# Patient Record
Sex: Female | Born: 1935 | Race: White | Hispanic: No | Marital: Single | Smoking: Never smoker
Health system: Southern US, Community
[De-identification: ages and names within clinical notes are randomized; demographics above are authoritative.]

## PROBLEM LIST (undated history)

## (undated) DIAGNOSIS — N289 Disorder of kidney and ureter, unspecified: Secondary | ICD-10-CM

## (undated) DIAGNOSIS — E785 Hyperlipidemia, unspecified: Secondary | ICD-10-CM

## (undated) DIAGNOSIS — N189 Chronic kidney disease, unspecified: Secondary | ICD-10-CM

## (undated) DIAGNOSIS — F0390 Unspecified dementia without behavioral disturbance: Secondary | ICD-10-CM

## (undated) DIAGNOSIS — M8580 Other specified disorders of bone density and structure, unspecified site: Secondary | ICD-10-CM

## (undated) HISTORY — DX: Hyperlipidemia, unspecified: E78.5

## (undated) HISTORY — DX: Other specified disorders of bone density and structure, unspecified site: M85.80

---

## 1997-10-04 ENCOUNTER — Other Ambulatory Visit: Admission: RE | Admit: 1997-10-04 | Discharge: 1997-10-04 | Payer: Self-pay | Admitting: *Deleted

## 1999-03-30 ENCOUNTER — Other Ambulatory Visit: Admission: RE | Admit: 1999-03-30 | Discharge: 1999-03-30 | Payer: Self-pay | Admitting: *Deleted

## 2005-03-25 ENCOUNTER — Other Ambulatory Visit: Admission: RE | Admit: 2005-03-25 | Discharge: 2005-03-25 | Payer: Self-pay | Admitting: Family Medicine

## 2010-07-17 ENCOUNTER — Emergency Department (HOSPITAL_COMMUNITY)
Admission: EM | Admit: 2010-07-17 | Discharge: 2010-07-17 | Payer: Self-pay | Source: Home / Self Care | Admitting: Emergency Medicine

## 2010-07-20 LAB — URINALYSIS, ROUTINE W REFLEX MICROSCOPIC
Nitrite: NEGATIVE
Specific Gravity, Urine: 1.009 (ref 1.005–1.030)
Urobilinogen, UA: 0.2 mg/dL (ref 0.0–1.0)
pH: 6.5 (ref 5.0–8.0)

## 2010-07-20 LAB — URINE MICROSCOPIC-ADD ON

## 2010-07-21 LAB — URINE CULTURE

## 2011-04-06 ENCOUNTER — Other Ambulatory Visit: Payer: Self-pay | Admitting: Family Medicine

## 2011-04-06 DIAGNOSIS — Z1231 Encounter for screening mammogram for malignant neoplasm of breast: Secondary | ICD-10-CM

## 2013-11-09 ENCOUNTER — Encounter: Payer: Self-pay | Admitting: Gastroenterology

## 2014-01-07 ENCOUNTER — Ambulatory Visit: Payer: Self-pay | Admitting: Gastroenterology

## 2017-11-08 ENCOUNTER — Other Ambulatory Visit: Payer: Self-pay | Admitting: Internal Medicine

## 2017-11-08 DIAGNOSIS — R413 Other amnesia: Secondary | ICD-10-CM

## 2017-11-15 ENCOUNTER — Other Ambulatory Visit: Payer: Self-pay

## 2018-01-16 ENCOUNTER — Telehealth: Payer: Self-pay

## 2018-01-16 ENCOUNTER — Ambulatory Visit: Payer: Federal, State, Local not specified - PPO | Admitting: Neurology

## 2018-01-16 NOTE — Telephone Encounter (Signed)
Pt did not show for their appt with Dr. Athar today.  

## 2018-01-18 ENCOUNTER — Encounter: Payer: Self-pay | Admitting: Neurology

## 2020-06-25 ENCOUNTER — Encounter (HOSPITAL_COMMUNITY): Payer: Self-pay | Admitting: Emergency Medicine

## 2020-06-25 ENCOUNTER — Emergency Department (HOSPITAL_COMMUNITY): Payer: Medicare Other

## 2020-06-25 ENCOUNTER — Other Ambulatory Visit: Payer: Self-pay

## 2020-06-25 ENCOUNTER — Inpatient Hospital Stay (HOSPITAL_COMMUNITY)
Admission: EM | Admit: 2020-06-25 | Discharge: 2020-07-07 | DRG: 521 | Disposition: A | Discharge status: Skilled Nursing Facility | Payer: Medicare Other | Source: Skilled Nursing Facility | Attending: Internal Medicine | Admitting: Internal Medicine

## 2020-06-25 DIAGNOSIS — R739 Hyperglycemia, unspecified: Secondary | ICD-10-CM | POA: Diagnosis present

## 2020-06-25 DIAGNOSIS — F0281 Dementia in other diseases classified elsewhere with behavioral disturbance: Secondary | ICD-10-CM | POA: Diagnosis not present

## 2020-06-25 DIAGNOSIS — S72009A Fracture of unspecified part of neck of unspecified femur, initial encounter for closed fracture: Secondary | ICD-10-CM | POA: Diagnosis present

## 2020-06-25 DIAGNOSIS — W1839XA Other fall on same level, initial encounter: Secondary | ICD-10-CM | POA: Diagnosis present

## 2020-06-25 DIAGNOSIS — E871 Hypo-osmolality and hyponatremia: Secondary | ICD-10-CM | POA: Diagnosis present

## 2020-06-25 DIAGNOSIS — R5381 Other malaise: Secondary | ICD-10-CM | POA: Diagnosis present

## 2020-06-25 DIAGNOSIS — F05 Delirium due to known physiological condition: Secondary | ICD-10-CM | POA: Diagnosis not present

## 2020-06-25 DIAGNOSIS — S72091A Other fracture of head and neck of right femur, initial encounter for closed fracture: Principal | ICD-10-CM | POA: Diagnosis present

## 2020-06-25 DIAGNOSIS — E43 Unspecified severe protein-calorie malnutrition: Secondary | ICD-10-CM | POA: Diagnosis present

## 2020-06-25 DIAGNOSIS — F0391 Unspecified dementia with behavioral disturbance: Secondary | ICD-10-CM | POA: Diagnosis present

## 2020-06-25 DIAGNOSIS — Z781 Physical restraint status: Secondary | ICD-10-CM

## 2020-06-25 DIAGNOSIS — I440 Atrioventricular block, first degree: Secondary | ICD-10-CM | POA: Diagnosis present

## 2020-06-25 DIAGNOSIS — S72001A Fracture of unspecified part of neck of right femur, initial encounter for closed fracture: Secondary | ICD-10-CM | POA: Diagnosis not present

## 2020-06-25 DIAGNOSIS — R030 Elevated blood-pressure reading, without diagnosis of hypertension: Secondary | ICD-10-CM | POA: Diagnosis present

## 2020-06-25 DIAGNOSIS — Y92122 Bedroom in nursing home as the place of occurrence of the external cause: Secondary | ICD-10-CM

## 2020-06-25 DIAGNOSIS — Z20822 Contact with and (suspected) exposure to covid-19: Secondary | ICD-10-CM | POA: Diagnosis present

## 2020-06-25 DIAGNOSIS — N390 Urinary tract infection, site not specified: Secondary | ICD-10-CM | POA: Diagnosis present

## 2020-06-25 DIAGNOSIS — N9489 Other specified conditions associated with female genital organs and menstrual cycle: Secondary | ICD-10-CM | POA: Diagnosis not present

## 2020-06-25 DIAGNOSIS — Z681 Body mass index (BMI) 19 or less, adult: Secondary | ICD-10-CM | POA: Diagnosis not present

## 2020-06-25 DIAGNOSIS — K5641 Fecal impaction: Secondary | ICD-10-CM | POA: Diagnosis not present

## 2020-06-25 DIAGNOSIS — S0003XA Contusion of scalp, initial encounter: Secondary | ICD-10-CM | POA: Diagnosis present

## 2020-06-25 DIAGNOSIS — M25551 Pain in right hip: Secondary | ICD-10-CM | POA: Diagnosis present

## 2020-06-25 DIAGNOSIS — D72829 Elevated white blood cell count, unspecified: Secondary | ICD-10-CM | POA: Diagnosis not present

## 2020-06-25 DIAGNOSIS — K5900 Constipation, unspecified: Secondary | ICD-10-CM | POA: Diagnosis not present

## 2020-06-25 DIAGNOSIS — F03918 Unspecified dementia, unspecified severity, with other behavioral disturbance: Secondary | ICD-10-CM | POA: Diagnosis present

## 2020-06-25 DIAGNOSIS — E785 Hyperlipidemia, unspecified: Secondary | ICD-10-CM | POA: Diagnosis present

## 2020-06-25 DIAGNOSIS — E876 Hypokalemia: Secondary | ICD-10-CM | POA: Diagnosis not present

## 2020-06-25 DIAGNOSIS — F0390 Unspecified dementia without behavioral disturbance: Secondary | ICD-10-CM | POA: Diagnosis present

## 2020-06-25 DIAGNOSIS — W19XXXA Unspecified fall, initial encounter: Secondary | ICD-10-CM

## 2020-06-25 DIAGNOSIS — D62 Acute posthemorrhagic anemia: Secondary | ICD-10-CM | POA: Diagnosis not present

## 2020-06-25 DIAGNOSIS — R3989 Other symptoms and signs involving the genitourinary system: Secondary | ICD-10-CM | POA: Diagnosis not present

## 2020-06-25 DIAGNOSIS — Z9119 Patient's noncompliance with other medical treatment and regimen: Secondary | ICD-10-CM

## 2020-06-25 DIAGNOSIS — G301 Alzheimer's disease with late onset: Secondary | ICD-10-CM | POA: Diagnosis not present

## 2020-06-25 DIAGNOSIS — D75838 Other thrombocytosis: Secondary | ICD-10-CM | POA: Diagnosis present

## 2020-06-25 DIAGNOSIS — Z96649 Presence of unspecified artificial hip joint: Secondary | ICD-10-CM

## 2020-06-25 DIAGNOSIS — I951 Orthostatic hypotension: Secondary | ICD-10-CM | POA: Diagnosis not present

## 2020-06-25 HISTORY — DX: Disorder of kidney and ureter, unspecified: N28.9

## 2020-06-25 HISTORY — DX: Unspecified dementia, unspecified severity, without behavioral disturbance, psychotic disturbance, mood disturbance, and anxiety: F03.90

## 2020-06-25 HISTORY — DX: Chronic kidney disease, unspecified: N18.9

## 2020-06-25 LAB — CBC WITH DIFFERENTIAL/PLATELET
Abs Immature Granulocytes: 0.08 10*3/uL — ABNORMAL HIGH (ref 0.00–0.07)
Basophils Absolute: 0 10*3/uL (ref 0.0–0.1)
Basophils Relative: 0 %
Eosinophils Absolute: 0.1 10*3/uL (ref 0.0–0.5)
Eosinophils Relative: 1 %
HCT: 36.9 % (ref 36.0–46.0)
Hemoglobin: 12.6 g/dL (ref 12.0–15.0)
Immature Granulocytes: 1 %
Lymphocytes Relative: 7 %
Lymphs Abs: 0.9 10*3/uL (ref 0.7–4.0)
MCH: 30.7 pg (ref 26.0–34.0)
MCHC: 34.1 g/dL (ref 30.0–36.0)
MCV: 90 fL (ref 80.0–100.0)
Monocytes Absolute: 1.1 10*3/uL — ABNORMAL HIGH (ref 0.1–1.0)
Monocytes Relative: 10 %
Neutro Abs: 9.6 10*3/uL — ABNORMAL HIGH (ref 1.7–7.7)
Neutrophils Relative %: 81 %
Platelets: 283 10*3/uL (ref 150–400)
RBC: 4.1 MIL/uL (ref 3.87–5.11)
RDW: 13.5 % (ref 11.5–15.5)
WBC: 11.8 10*3/uL — ABNORMAL HIGH (ref 4.0–10.5)
nRBC: 0 % (ref 0.0–0.2)

## 2020-06-25 LAB — BASIC METABOLIC PANEL
Anion gap: 12 (ref 5–15)
BUN: 24 mg/dL — ABNORMAL HIGH (ref 8–23)
CO2: 23 mmol/L (ref 22–32)
Calcium: 9.4 mg/dL (ref 8.9–10.3)
Chloride: 93 mmol/L — ABNORMAL LOW (ref 98–111)
Creatinine, Ser: 0.83 mg/dL (ref 0.44–1.00)
GFR, Estimated: >60 mL/min (ref >60)
Glucose, Bld: 115 mg/dL — ABNORMAL HIGH (ref 70–99)
Potassium: 4.2 mmol/L (ref 3.5–5.1)
Sodium: 128 mmol/L — ABNORMAL LOW (ref 135–145)

## 2020-06-25 LAB — RESP PANEL BY RT-PCR (FLU A&B, COVID) ARPGX2
Influenza A by PCR: NEGATIVE
Influenza B by PCR: NEGATIVE
SARS Coronavirus 2 by RT PCR: NEGATIVE

## 2020-06-25 LAB — CBG MONITORING, ED: Glucose-Capillary: 157 mg/dL — ABNORMAL HIGH (ref 70–99)

## 2020-06-25 MED ORDER — MORPHINE SULFATE (PF) 2 MG/ML IV SOLN
0.5000 mg | INTRAVENOUS | Status: DC | PRN
  Administered 2020-06-26: 0.5 mg via INTRAVENOUS
  Filled 2020-06-25: qty 1

## 2020-06-25 MED ORDER — FENTANYL CITRATE (PF) 100 MCG/2ML IJ SOLN
50.0000 ug | Freq: Once | INTRAMUSCULAR | Status: AC
  Administered 2020-06-25: 50 ug via INTRAVENOUS
  Filled 2020-06-25: qty 2

## 2020-06-25 NOTE — ED Notes (Signed)
Patient at triage recliner - patients son called for an update, Annette Stable [phone]

## 2020-06-25 NOTE — ED Triage Notes (Signed)
Patient found the floor by staff at [facility] nursing home this evening , unwitnessed fall , reports pain at right hip worse with movement. She is not taking anticoagulant . Assisted on a recliner at triage .

## 2020-06-25 NOTE — H&P (Signed)
History and Physical    Suzanne Hall HMC:[id number] DOB: 1932-08-04 DOA: 06/25/2020  PCP: No primary care provider on file.  Patient coming from: Memory care unit.  Most of the history was obtained from the ER physician.  Patient has dementia.  Chief Complaint: Unwitnessed fall.  HPI: Suzanne Hall is a 86 y.o. female with history of dementia was brought to the ER after patient was found to have unwitnessed fall and was found on the floor.  The exact circumstances of the fall is not clear.  After the fall patient was complaining of right hip pain.  ED Course: In the ER patient's x-ray showed right hip fracture and also CT head shows frontal scalp hematoma.  Labs are significant for sodium of 128 and no old labs to compare.  WBC count 11.8 Covid testing negative EKG shows sinus tachycardia with a first-degree AV block.  Dr. Victorino Dike on-call orthopedic surgeon has been consulted and plan is to have surgery in the morning.  Review of Systems: As per HPI, rest all negative.   Past Medical History:  Diagnosis Date  . CKD (chronic kidney disease)   . Dementia (HCC)   . Hyperlipidemia   . Osteopenia   . Renal disorder     History reviewed. No pertinent surgical history.   reports that she has never smoked. She has never used smokeless tobacco. She reports that she does not drink alcohol and does not use drugs.  No Known Allergies  Family History  Problem Relation Age of Onset  . Heart disease Father     Prior to Admission medications   Not on File    Physical Exam: Constitutional: Moderately built and nourished. Vitals:   06/25/20 1940  BP: (!) 175/82  Pulse: 98  Resp: 16  Temp: 98.3 F (36.8 C)  TempSrc: Oral  SpO2: 91%  Weight: 51.3 kg  Height: 5\' 5"  (1.651 m)   Eyes: Anicteric no pallor. ENMT: No discharge from the ears eyes nose or mouth. Neck: No mass felt.  No neck rigidity. Respiratory: No rhonchi or crepitations. Cardiovascular: S1-S2  heard. Abdomen: Soft nontender bowel sounds present. Musculoskeletal: No edema.  Pain on moving right hip. Skin: No rash. Neurologic: Alert awake oriented to time place and person.  Moves all extremities. Psychiatric: Appears normal.  Normal affect.   Labs on Admission: I have personally reviewed following labs and imaging studies  CBC: Recent Labs  Lab 06/25/20 1948  WBC 11.8*  NEUTROABS 9.6*  HGB 12.6  HCT 36.9  MCV 90.0  PLT 283   Basic Metabolic Panel: Recent Labs  Lab 06/25/20 1948  NA 128*  K 4.2  CL 93*  CO2 23  GLUCOSE 115*  BUN 24*  CREATININE 0.83  CALCIUM 9.4   GFR: Estimated Creatinine Clearance: 40.1 mL/min (by C-G formula based on SCr of 0.83 mg/dL). Liver Function Tests: No results for input(s): AST, ALT, ALKPHOS, BILITOT, PROT, ALBUMIN in the last 168 hours. No results for input(s): LIPASE, AMYLASE in the last 168 hours. No results for input(s): AMMONIA in the last 168 hours. Coagulation Profile: No results for input(s): INR, PROTIME in the last 168 hours. Cardiac Enzymes: No results for input(s): CKTOTAL, CKMB, CKMBINDEX, TROPONINI in the last 168 hours. BNP (last 3 results) No results for input(s): PROBNP in the last 8760 hours. HbA1C: No results for input(s): HGBA1C in the last 72 hours. CBG: No results for input(s): GLUCAP in the last 168 hours. Lipid Profile: No results for input(s):  CHOL, HDL, LDLCALC, TRIG, CHOLHDL, LDLDIRECT in the last 72 hours. Thyroid Function Tests: No results for input(s): TSH, T4TOTAL, FREET4, T3FREE, THYROIDAB in the last 72 hours. Anemia Panel: No results for input(s): VITAMINB12, FOLATE, FERRITIN, TIBC, IRON, RETICCTPCT in the last 72 hours. Urine analysis:    Component Value Date/Time   COLORURINE YELLOW 07/17/2010 2145   APPEARANCEUR CLOUDY (A) 07/17/2010 2145   LABSPEC 1.009 07/17/2010 2145   PHURINE 6.5 07/17/2010 2145   HGBUR LARGE (A) 07/17/2010 2145   BILIRUBINUR NEGATIVE 07/17/2010 2145    KETONESUR NEGATIVE 07/17/2010 2145   PROTEINUR NEGATIVE 07/17/2010 2145   UROBILINOGEN 0.2 07/17/2010 2145   NITRITE NEGATIVE 07/17/2010 2145   LEUKOCYTESUR LARGE (A) 07/17/2010 2145   Sepsis Labs: @LABRCNTIP (procalcitonin:4,lacticidven:4) ) Recent Results (from the past 240 hour(s))  Resp Panel by RT-PCR (Flu A&B, Covid) Nasopharyngeal Swab     Status: None   Collection Time: 06/25/20  8:18 PM   Specimen: Nasopharyngeal Swab; Nasopharyngeal(NP) swabs in vial transport medium  Result Value Ref Range Status   SARS Coronavirus 2 by RT PCR NEGATIVE NEGATIVE Final    Comment: (NOTE) SARS-CoV-2 target nucleic acids are NOT DETECTED.  The SARS-CoV-2 RNA is generally detectable in upper respiratory specimens during the acute phase of infection. The lowest concentration of SARS-CoV-2 viral copies this assay can detect is 138 copies/mL. A negative result does not preclude SARS-Cov-2 infection and should not be used as the sole basis for treatment or other patient management decisions. A negative result may occur with  improper specimen collection/handling, submission of specimen other than nasopharyngeal swab, presence of viral mutation(s) within the areas targeted by this assay, and inadequate number of viral copies(<138 copies/mL). A negative result must be combined with clinical observations, patient history, and epidemiological information. The expected result is Negative.  Fact Sheet for Patients:  BloggerCourse.comhttps://www.fda.gov/media/152166/download  Fact Sheet for Healthcare Providers:  SeriousBroker.ithttps://www.fda.gov/media/152162/download  This test is no t yet approved or cleared by the [place] FDA and  has been authorized for detection and/or diagnosis of SARS-CoV-2 by FDA under an Emergency Use Authorization (EUA). This EUA will remain  in effect (meaning this test can be used) for the duration of the COVID-19 declaration under Section 564(b)(1) of the Act, 21 U.S.C.section 360bbb-3(b)(1),  unless the authorization is terminated  or revoked sooner.       Influenza A by PCR NEGATIVE NEGATIVE Final   Influenza B by PCR NEGATIVE NEGATIVE Final    Comment: (NOTE) The Xpert Xpress SARS-CoV-2/FLU/RSV plus assay is intended as an aid in the diagnosis of influenza from Nasopharyngeal swab specimens and should not be used as a sole basis for treatment. Nasal washings and aspirates are unacceptable for Xpert Xpress SARS-CoV-2/FLU/RSV testing.  Fact Sheet for Patients: BloggerCourse.comhttps://www.fda.gov/media/152166/download  Fact Sheet for Healthcare Providers: SeriousBroker.ithttps://www.fda.gov/media/152162/download  This test is not yet approved or cleared by the [place] FDA and has been authorized for detection and/or diagnosis of SARS-CoV-2 by FDA under an Emergency Use Authorization (EUA). This EUA will remain in effect (meaning this test can be used) for the duration of the COVID-19 declaration under Section 564(b)(1) of the Act, 21 U.S.C. section 360bbb-3(b)(1), unless the authorization is terminated or revoked.  Performed at [facility], [address] [place], [place] [address]      Radiological Exams on Admission: DG Chest 1 View  Result Date: 06/25/2020 CLINICAL DATA:  Right hip fracture. EXAM: CHEST  1 VIEW COMPARISON:  None. FINDINGS: The heart size and mediastinal contours are  within normal limits. Both lungs are clear. The visualized skeletal structures are unremarkable. IMPRESSION: No active disease. Electronically Signed   By: Lupita Raider M.D.   On: 06/25/2020 20:15   CT Head Wo Contrast  Result Date: 06/25/2020 CLINICAL DATA:  Un witnessed fall, head trauma EXAM: CT HEAD WITHOUT CONTRAST TECHNIQUE: Contiguous axial images were obtained from the base of the skull through the vertex without intravenous contrast. COMPARISON:  None. FINDINGS: Brain: Encephalomalacia within the anterior aspect left temporal lobe may reflect previous infarct or trauma. No acute infarct  or hemorrhage. Lateral ventricles and midline structures are unremarkable. No acute extra-axial fluid collections. No mass effect. Vascular: No hyperdense vessel or unexpected calcification. Skull: Minimal right frontal scalp edema. Negative for fracture or focal lesion. Sinuses/Orbits: No acute finding. Other: None. IMPRESSION: 1. Small right frontal scalp hematoma. 2. No acute intracranial process. Electronically Signed   By: Sharlet Salina M.D.   On: 06/25/2020 21:08   CT Cervical Spine Wo Contrast  Result Date: 06/25/2020 CLINICAL DATA:  Un witnessed fall EXAM: CT CERVICAL SPINE WITHOUT CONTRAST TECHNIQUE: Multidetector CT imaging of the cervical spine was performed without intravenous contrast. Multiplanar CT image reconstructions were also generated. COMPARISON:  None. FINDINGS: Alignment: There is mild retrolisthesis of C5 on C6. Otherwise alignment is anatomic. Skull base and vertebrae: No acute fracture. No primary bone lesion or focal pathologic process. Soft tissues and spinal canal: No prevertebral fluid or swelling. No visible canal hematoma. Disc levels: There is mild spondylosis at C3-4, resulting in mild left neural foraminal encroachment. There is moderate spondylosis at C5-6, resulting in symmetrical neural foraminal encroachment. Mild spondylosis at C6-7 results in left-sided neural foraminal encroachment. There is diffuse facet hypertrophy throughout the cervical spine. Upper chest: Airway is patent.  Lung apices are clear. Other: Reconstructed images demonstrate no additional findings. IMPRESSION: 1. Multilevel cervical spondylosis and facet hypertrophy. 2. No acute cervical spine fracture. Electronically Signed   By: Sharlet Salina M.D.   On: 06/25/2020 21:11   DG Hip Unilat W or Wo Pelvis 2-3 Views Right  Result Date: 06/25/2020 CLINICAL DATA:  Right hip pain after fall. EXAM: DG HIP (WITH OR WITHOUT PELVIS) 2-3V RIGHT COMPARISON:  None. FINDINGS: Severely displaced proximal right  femoral neck fracture is noted. Left hip is unremarkable. IMPRESSION: Severely displaced proximal right femoral neck fracture. Electronically Signed   By: Lupita Raider M.D.   On: 06/25/2020 20:15    EKG: Independently reviewed.  Sinus tachycardia with first-degree AV block.  Assessment/Plan Principal Problem:   Closed right hip fracture (HCC) Active Problems:   Hyponatremia   Dementia (HCC)   Hip fracture (HCC)    1. Right hip fracture unwitnessed fall.  Plan is to have surgery in the morning for which patient will be kept n.p.o. past midnight.  Pain relief medications. 2. Hyponatremia cause not clear.  Will trend metabolic panel check urine studies including urine sodium osmolality check TSH and cortisol level. 3. Elevated blood pressure readings we will keep patient on as needed IV hydralazine.  Follow blood pressure trends. 4. History of dementia. 5. Scalp hematoma seen in the CAT scan.  We will need to get further history from patient's son when available. Home medications needs to be verified.    DVT prophylaxis: SCDs.  Avoiding anticoagulation until surgery. Code Status: Full code which is to be confirmed with patient's son. Family Communication: We will need to talk with patient's son. Disposition Plan: May need rehab. Consults called: Orthopedics.  Admission status: Inpatient.   Eduard Clos MD [facility] Pager [phone].  If 7PM-7AM, please contact night-coverage [email or web address] Password [email or web address]  06/25/2020, 11:10 PM

## 2020-06-25 NOTE — ED Provider Notes (Signed)
[facility] Provider Note   CSN: [id number] Arrival date & time: 06/25/20  1925     History Chief Complaint  Patient presents with  . Fall/Hip Pain     Suzanne Hall is a 86 y.o. female.  Patient with history of dementia who presents from nursing home with right hip pain after unwitnessed fall.  Patient states that she remembers walking and having her leg twist.  She did not think that she hit her head.  She is having mostly pain in the right hip.  Denies any headache, neck pain.  She denies being on any blood thinners.  The history is provided by the patient.  Hip Pain This is a new problem. The problem occurs constantly. The problem has not changed since onset.Pertinent negatives include no chest pain, no abdominal pain, no headaches and no shortness of breath. Nothing aggravates the symptoms. Nothing relieves the symptoms. She has tried nothing for the symptoms. The treatment provided no relief.       Past Medical History:  Diagnosis Date  . CKD (chronic kidney disease)   . Dementia (HCC)   . Hyperlipidemia   . Osteopenia   . Renal disorder     Patient Active Problem List   Diagnosis Date Noted  . Closed right hip fracture (HCC) 06/25/2020    History reviewed. No pertinent surgical history.   OB History   No obstetric history on file.     Family History  Problem Relation Age of Onset  . Heart disease Father     Social History   Tobacco Use  . Smoking status: Never Smoker  . Smokeless tobacco: Never Used  Substance Use Topics  . Alcohol use: Never  . Drug use: Never    Home Medications Prior to Admission medications   Not on File    Allergies    Patient has no known allergies.  Review of Systems   Review of Systems  Constitutional: Negative for chills and fever.  HENT: Negative for ear pain and sore throat.   Eyes: Negative for pain and visual disturbance.  Respiratory: Negative for cough and  shortness of breath.   Cardiovascular: Negative for chest pain and palpitations.  Gastrointestinal: Negative for abdominal pain and vomiting.  Genitourinary: Negative for dysuria and hematuria.  Musculoskeletal: Positive for arthralgias and gait problem. Negative for back pain.  Skin: Negative for color change and rash.  Neurological: Negative for seizures, syncope and headaches.  All other systems reviewed and are negative.   Physical Exam Updated Vital Signs  ED Triage Vitals  Enc Vitals Group     BP 06/25/20 1940 (!) 175/82     Pulse Rate 06/25/20 1940 98     Resp 06/25/20 1940 16     Temp 06/25/20 1940 98.3 F (36.8 C)     Temp Source 06/25/20 1940 Oral     SpO2 06/25/20 1940 91 %     Weight 06/25/20 1940 113 lb (51.3 kg)     Height 06/25/20 1940 5\' 5"  (1.651 m)     Head Circumference --      Peak Flow --      Pain Score 06/25/20 1936 7     Pain Loc --      Pain Edu? --      Excl. in GC? --     Physical Exam Vitals and nursing note reviewed.  Constitutional:      General: She is not in acute distress.  Appearance: She is well-developed and well-nourished. She is not ill-appearing.  HENT:     Head:     Comments: Hematoma over the right side of the head    Nose: Nose normal.     Mouth/Throat:     Mouth: Mucous membranes are moist.  Eyes:     Extraocular Movements: Extraocular movements intact.     Conjunctiva/sclera: Conjunctivae normal.     Pupils: Pupils are equal, round, and reactive to light.  Cardiovascular:     Rate and Rhythm: Normal rate and regular rhythm.     Pulses: Normal pulses.     Heart sounds: Normal heart sounds. No murmur heard.   Pulmonary:     Effort: Pulmonary effort is normal. No respiratory distress.     Breath sounds: Normal breath sounds.  Abdominal:     Palpations: Abdomen is soft.     Tenderness: There is no abdominal tenderness.  Musculoskeletal:        General: Tenderness (right hip) present. No edema.     Cervical back:  Normal range of motion and neck supple. No tenderness.  Skin:    General: Skin is warm and dry.     Capillary Refill: Capillary refill takes less than 2 seconds.  Neurological:     General: No focal deficit present.     Mental Status: She is alert.  Psychiatric:        Mood and Affect: Mood and affect normal.     ED Results / Procedures / Treatments   Labs (all labs ordered are listed, but only abnormal results are displayed) Labs Reviewed  CBC WITH DIFFERENTIAL/PLATELET - Abnormal; Notable for the following components:      Result Value   WBC 11.8 (*)    Neutro Abs 9.6 (*)    Monocytes Absolute 1.1 (*)    Abs Immature Granulocytes 0.08 (*)    All other components within normal limits  BASIC METABOLIC PANEL - Abnormal; Notable for the following components:   Sodium 128 (*)    Chloride 93 (*)    Glucose, Bld 115 (*)    BUN 24 (*)    All other components within normal limits  RESP PANEL BY RT-PCR (FLU A&B, COVID) [id number]  URINALYSIS, ROUTINE W REFLEX MICROSCOPIC    EKG EKG Interpretation  Date/Time:  Wednesday June 25 2020 21:27:53 EST Ventricular Rate:  110 PR Interval:    QRS Duration: 64 QT Interval:  310 QTC Calculation: 419 R Axis:   75 Text Interpretation: Sinus tachycardia with 1st degree A-V block Septal infarct , age undetermined Abnormal ECG Confirmed by Virgina Norfolk [phone]) on 06/25/2020 9:35:18 PM   Radiology DG Chest 1 View  Result Date: 06/25/2020 CLINICAL DATA:  Right hip fracture. EXAM: CHEST  1 VIEW COMPARISON:  None. FINDINGS: The heart size and mediastinal contours are within normal limits. Both lungs are clear. The visualized skeletal structures are unremarkable. IMPRESSION: No active disease. Electronically Signed   By: Lupita Raider M.D.   On: 06/25/2020 20:15   CT Head Wo Contrast  Result Date: 06/25/2020 CLINICAL DATA:  Un witnessed fall, head trauma EXAM: CT HEAD WITHOUT CONTRAST TECHNIQUE: Contiguous axial images were obtained from  the base of the skull through the vertex without intravenous contrast. COMPARISON:  None. FINDINGS: Brain: Encephalomalacia within the anterior aspect left temporal lobe may reflect previous infarct or trauma. No acute infarct or hemorrhage. Lateral ventricles and midline structures are unremarkable. No acute extra-axial fluid collections. No mass effect. Vascular:  No hyperdense vessel or unexpected calcification. Skull: Minimal right frontal scalp edema. Negative for fracture or focal lesion. Sinuses/Orbits: No acute finding. Other: None. IMPRESSION: 1. Small right frontal scalp hematoma. 2. No acute intracranial process. Electronically Signed   By: Sharlet Salina M.D.   On: 06/25/2020 21:08   CT Cervical Spine Wo Contrast  Result Date: 06/25/2020 CLINICAL DATA:  Un witnessed fall EXAM: CT CERVICAL SPINE WITHOUT CONTRAST TECHNIQUE: Multidetector CT imaging of the cervical spine was performed without intravenous contrast. Multiplanar CT image reconstructions were also generated. COMPARISON:  None. FINDINGS: Alignment: There is mild retrolisthesis of C5 on C6. Otherwise alignment is anatomic. Skull base and vertebrae: No acute fracture. No primary bone lesion or focal pathologic process. Soft tissues and spinal canal: No prevertebral fluid or swelling. No visible canal hematoma. Disc levels: There is mild spondylosis at C3-4, resulting in mild left neural foraminal encroachment. There is moderate spondylosis at C5-6, resulting in symmetrical neural foraminal encroachment. Mild spondylosis at C6-7 results in left-sided neural foraminal encroachment. There is diffuse facet hypertrophy throughout the cervical spine. Upper chest: Airway is patent.  Lung apices are clear. Other: Reconstructed images demonstrate no additional findings. IMPRESSION: 1. Multilevel cervical spondylosis and facet hypertrophy. 2. No acute cervical spine fracture. Electronically Signed   By: Sharlet Salina M.D.   On: 06/25/2020 21:11   DG  Hip Unilat W or Wo Pelvis 2-3 Views Right  Result Date: 06/25/2020 CLINICAL DATA:  Right hip pain after fall. EXAM: DG HIP (WITH OR WITHOUT PELVIS) 2-3V RIGHT COMPARISON:  None. FINDINGS: Severely displaced proximal right femoral neck fracture is noted. Left hip is unremarkable. IMPRESSION: Severely displaced proximal right femoral neck fracture. Electronically Signed   By: Lupita Raider M.D.   On: 06/25/2020 20:15    Procedures Procedures (including critical care time)  Medications Ordered in ED Medications  fentaNYL (SUBLIMAZE) injection 50 mcg (has no administration in time range)    ED Course  I have reviewed the triage vital signs and the nursing notes.  Pertinent labs & imaging results that were available during my care of the patient were reviewed by me and considered in my medical decision making (see chart for details).    MDM Rules/Calculators/A&P                          MURPHY BUNDICK is an 86 year old female with history of dementia, CKD who presents to the ED with unwitnessed fall.  Pain in the right hip.  Did not think she hit her head.  Maybe there is a small bump over the right side of her head.  Has good pulses in her right lower extremity.  Neurovascularly intact.  X-ray of the right hip shows proximal femoral neck fracture.  CT of the head and neck were overall unremarkable.  General screening labs also unremarkable except for mild hyponatremia with a sodium of 128.  Patient is not on blood thinners or any other chronic medications.  Talk with the patient's son to make them aware.  Talked with Dr. Victorino Dike with orthopedics who recommends n.p.o. after midnight and likely surgery tomorrow.  Patient admitted to medicine for further care.  This chart was dictated using voice recognition software.  Despite best efforts to proofread,  errors can occur which can change the documentation meaning.    Final Clinical Impression(s) / ED Diagnoses Final diagnoses:  Closed  fracture of right hip, initial encounter [facility])    Rx /  DC Orders ED Discharge Orders    None       Virgina Norfolk, DO 06/25/20 2211

## 2020-06-26 ENCOUNTER — Other Ambulatory Visit: Payer: Self-pay

## 2020-06-26 ENCOUNTER — Inpatient Hospital Stay (HOSPITAL_COMMUNITY): Payer: Medicare Other | Admitting: Certified Registered Nurse Anesthetist

## 2020-06-26 ENCOUNTER — Encounter (HOSPITAL_COMMUNITY)
Admission: EM | Disposition: A | Discharge status: Skilled Nursing Facility | Payer: Self-pay | Source: Skilled Nursing Facility | Attending: Internal Medicine

## 2020-06-26 ENCOUNTER — Encounter (HOSPITAL_COMMUNITY): Payer: Self-pay | Admitting: Internal Medicine

## 2020-06-26 ENCOUNTER — Inpatient Hospital Stay (HOSPITAL_COMMUNITY): Payer: Medicare Other

## 2020-06-26 DIAGNOSIS — E871 Hypo-osmolality and hyponatremia: Secondary | ICD-10-CM | POA: Diagnosis not present

## 2020-06-26 DIAGNOSIS — R3989 Other symptoms and signs involving the genitourinary system: Secondary | ICD-10-CM

## 2020-06-26 DIAGNOSIS — S72001A Fracture of unspecified part of neck of right femur, initial encounter for closed fracture: Secondary | ICD-10-CM | POA: Diagnosis not present

## 2020-06-26 DIAGNOSIS — D72829 Elevated white blood cell count, unspecified: Secondary | ICD-10-CM

## 2020-06-26 HISTORY — PX: HIP ARTHROPLASTY: SHX981

## 2020-06-26 LAB — URINALYSIS, ROUTINE W REFLEX MICROSCOPIC
Bilirubin Urine: NEGATIVE
Glucose, UA: NEGATIVE mg/dL
Ketones, ur: NEGATIVE mg/dL
Nitrite: POSITIVE — AB
Protein, ur: 100 mg/dL — AB
Specific Gravity, Urine: 1.016 (ref 1.005–1.030)
WBC, UA: >50 WBC/hpf — ABNORMAL HIGH (ref 0–5)
pH: 6 (ref 5.0–8.0)

## 2020-06-26 LAB — CBC
HCT: 39.6 % (ref 36.0–46.0)
Hemoglobin: 13 g/dL (ref 12.0–15.0)
MCH: 29.4 pg (ref 26.0–34.0)
MCHC: 32.8 g/dL (ref 30.0–36.0)
MCV: 89.6 fL (ref 80.0–100.0)
Platelets: 267 10*3/uL (ref 150–400)
RBC: 4.42 MIL/uL (ref 3.87–5.11)
RDW: 13.2 % (ref 11.5–15.5)
WBC: 14.9 10*3/uL — ABNORMAL HIGH (ref 4.0–10.5)
nRBC: 0 % (ref 0.0–0.2)

## 2020-06-26 LAB — BASIC METABOLIC PANEL
Anion gap: 11 (ref 5–15)
Anion gap: 9 (ref 5–15)
BUN: 20 mg/dL (ref 8–23)
BUN: 18 mg/dL (ref 8–23)
CO2: 22 mmol/L (ref 22–32)
CO2: 24 mmol/L (ref 22–32)
Calcium: 9.1 mg/dL (ref 8.9–10.3)
Calcium: 9.3 mg/dL (ref 8.9–10.3)
Chloride: 93 mmol/L — ABNORMAL LOW (ref 98–111)
Chloride: 93 mmol/L — ABNORMAL LOW (ref 98–111)
Creatinine, Ser: 0.77 mg/dL (ref 0.44–1.00)
Creatinine, Ser: 0.77 mg/dL (ref 0.44–1.00)
GFR, Estimated: >60 mL/min (ref >60)
GFR, Estimated: >60 mL/min (ref >60)
Glucose, Bld: 124 mg/dL — ABNORMAL HIGH (ref 70–99)
Glucose, Bld: 132 mg/dL — ABNORMAL HIGH (ref 70–99)
Potassium: 3.8 mmol/L (ref 3.5–5.1)
Potassium: 3.8 mmol/L (ref 3.5–5.1)
Sodium: 126 mmol/L — ABNORMAL LOW (ref 135–145)
Sodium: 126 mmol/L — ABNORMAL LOW (ref 135–145)

## 2020-06-26 LAB — GLUCOSE, CAPILLARY
Glucose-Capillary: 141 mg/dL — ABNORMAL HIGH (ref 70–99)
Glucose-Capillary: 132 mg/dL — ABNORMAL HIGH (ref 70–99)
Glucose-Capillary: 138 mg/dL — ABNORMAL HIGH (ref 70–99)

## 2020-06-26 LAB — SURGICAL PCR SCREEN
MRSA, PCR: NEGATIVE
Staphylococcus aureus: NEGATIVE

## 2020-06-26 LAB — OSMOLALITY, URINE: Osmolality, Ur: 532 mOsm/kg (ref 300–900)

## 2020-06-26 LAB — CORTISOL: Cortisol, Plasma: 29.2 ug/dL

## 2020-06-26 LAB — TSH: TSH: 3.386 u[IU]/mL (ref 0.350–4.500)

## 2020-06-26 LAB — SODIUM, URINE, RANDOM: Sodium, Ur: 81 mmol/L

## 2020-06-26 SURGERY — HEMIARTHROPLASTY, HIP, DIRECT ANTERIOR APPROACH, FOR FRACTURE
Anesthesia: General | Site: Hip | Laterality: Right

## 2020-06-26 MED ORDER — ACETAMINOPHEN 325 MG PO TABS
325.0000 mg | ORAL_TABLET | Freq: Four times a day (QID) | ORAL | Status: DC | PRN
  Administered 2020-06-30: 650 mg via ORAL
  Administered 2020-07-04: 325 mg via ORAL
  Filled 2020-06-26: qty 1
  Filled 2020-06-26: qty 2

## 2020-06-26 MED ORDER — DOCUSATE SODIUM 100 MG PO CAPS
100.0000 mg | ORAL_CAPSULE | Freq: Two times a day (BID) | ORAL | Status: DC
  Administered 2020-06-26 – 2020-06-30 (×3): 100 mg via ORAL
  Filled 2020-06-26 (×8): qty 1

## 2020-06-26 MED ORDER — METHOCARBAMOL 500 MG PO TABS
500.0000 mg | ORAL_TABLET | Freq: Four times a day (QID) | ORAL | Status: DC | PRN
  Filled 2020-06-26: qty 1

## 2020-06-26 MED ORDER — PHENYLEPHRINE 40 MCG/ML (10ML) SYRINGE FOR IV PUSH (FOR BLOOD PRESSURE SUPPORT)
PREFILLED_SYRINGE | INTRAVENOUS | Status: AC
  Filled 2020-06-26: qty 10

## 2020-06-26 MED ORDER — MAGNESIUM CITRATE PO SOLN: 1.0000 | Freq: Once | ORAL | Status: DC | PRN

## 2020-06-26 MED ORDER — CHLORHEXIDINE GLUCONATE 0.12 % MT SOLN
15.0000 mL | OROMUCOSAL | Status: AC
  Filled 2020-06-26: qty 15

## 2020-06-26 MED ORDER — FENTANYL CITRATE (PF) 250 MCG/5ML IJ SOLN
INTRAMUSCULAR | Status: DC | PRN
  Administered 2020-06-26: 25 ug via INTRAVENOUS
  Administered 2020-06-26: 50 ug via INTRAVENOUS

## 2020-06-26 MED ORDER — METHOCARBAMOL 1000 MG/10ML IJ SOLN
500.0000 mg | Freq: Four times a day (QID) | INTRAVENOUS | Status: DC | PRN
  Filled 2020-06-26: qty 5

## 2020-06-26 MED ORDER — MORPHINE SULFATE (PF) 2 MG/ML IV SOLN
0.5000 mg | INTRAVENOUS | Status: DC | PRN
  Administered 2020-06-26 – 2020-06-28 (×2): 1 mg via INTRAVENOUS
  Administered 2020-06-29: 0.5 mg via INTRAVENOUS
  Administered 2020-06-29 (×2): 1 mg via INTRAVENOUS
  Filled 2020-06-26 (×6): qty 1

## 2020-06-26 MED ORDER — DEXAMETHASONE SODIUM PHOSPHATE 10 MG/ML IJ SOLN
INTRAMUSCULAR | Status: AC
  Filled 2020-06-26: qty 1

## 2020-06-26 MED ORDER — SODIUM CHLORIDE 0.9 % IV SOLN
1.0000 g | INTRAVENOUS | Status: DC
  Administered 2020-06-26 – 2020-07-01 (×5): 1 g via INTRAVENOUS
  Filled 2020-06-26 (×6): qty 10

## 2020-06-26 MED ORDER — PROPOFOL 10 MG/ML IV BOLUS
INTRAVENOUS | Status: DC | PRN
  Administered 2020-06-26: 70 mg via INTRAVENOUS

## 2020-06-26 MED ORDER — EPHEDRINE SULFATE-NACL 50-0.9 MG/10ML-% IV SOSY
PREFILLED_SYRINGE | INTRAVENOUS | Status: DC | PRN
  Administered 2020-06-26: 10 mg via INTRAVENOUS

## 2020-06-26 MED ORDER — PHENYLEPHRINE HCL-NACL 10-0.9 MG/250ML-% IV SOLN
INTRAVENOUS | Status: DC | PRN
  Administered 2020-06-26: 75 ug/min via INTRAVENOUS

## 2020-06-26 MED ORDER — ONDANSETRON HCL 4 MG/2ML IJ SOLN
INTRAMUSCULAR | Status: AC
  Filled 2020-06-26: qty 2

## 2020-06-26 MED ORDER — ENSURE ENLIVE PO LIQD
237.0000 mL | Freq: Three times a day (TID) | ORAL | Status: DC
  Administered 2020-06-26 – 2020-07-07 (×22): 237 mL via ORAL

## 2020-06-26 MED ORDER — DEXAMETHASONE SODIUM PHOSPHATE 10 MG/ML IJ SOLN
INTRAMUSCULAR | Status: DC | PRN
  Administered 2020-06-26: 4 mg via INTRAVENOUS

## 2020-06-26 MED ORDER — CHLORHEXIDINE GLUCONATE 0.12 % MT SOLN
OROMUCOSAL | Status: AC
  Administered 2020-06-26: 08:00:00 15 mL via OROMUCOSAL
  Filled 2020-06-26: qty 15

## 2020-06-26 MED ORDER — SODIUM CHLORIDE 0.9 % IR SOLN
Status: DC | PRN
  Administered 2020-06-26: 1000 mL

## 2020-06-26 MED ORDER — PHENYLEPHRINE 40 MCG/ML (10ML) SYRINGE FOR IV PUSH (FOR BLOOD PRESSURE SUPPORT)
PREFILLED_SYRINGE | INTRAVENOUS | Status: DC | PRN
  Administered 2020-06-26 (×2): 160 ug via INTRAVENOUS
  Administered 2020-06-26: 80 ug via INTRAVENOUS

## 2020-06-26 MED ORDER — ROCURONIUM BROMIDE 10 MG/ML (PF) SYRINGE
PREFILLED_SYRINGE | INTRAVENOUS | Status: DC | PRN
  Administered 2020-06-26: 50 mg via INTRAVENOUS

## 2020-06-26 MED ORDER — ASPIRIN 81 MG PO CHEW
81.0000 mg | CHEWABLE_TABLET | Freq: Two times a day (BID) | ORAL | Status: DC
  Administered 2020-06-27 – 2020-06-29 (×4): 81 mg via ORAL
  Filled 2020-06-26 (×7): qty 1

## 2020-06-26 MED ORDER — DIPHENHYDRAMINE HCL 12.5 MG/5ML PO ELIX
12.5000 mg | ORAL_SOLUTION | ORAL | Status: DC | PRN
  Filled 2020-06-26: qty 10

## 2020-06-26 MED ORDER — OXYCODONE HCL 5 MG PO TABS: 5.0000 mg | ORAL_TABLET | Freq: Once | ORAL | Status: DC | PRN

## 2020-06-26 MED ORDER — FERROUS SULFATE 325 (65 FE) MG PO TABS
325.0000 mg | ORAL_TABLET | Freq: Three times a day (TID) | ORAL | Status: DC
  Administered 2020-06-26 – 2020-07-07 (×31): 325 mg via ORAL
  Filled 2020-06-26 (×28): qty 1

## 2020-06-26 MED ORDER — DEXAMETHASONE SODIUM PHOSPHATE 10 MG/ML IJ SOLN
10.0000 mg | Freq: Once | INTRAMUSCULAR | Status: AC
  Administered 2020-06-27: 10 mg via INTRAVENOUS
  Filled 2020-06-26: qty 1

## 2020-06-26 MED ORDER — FENTANYL CITRATE (PF) 100 MCG/2ML IJ SOLN
INTRAMUSCULAR | Status: AC
  Filled 2020-06-26: qty 2

## 2020-06-26 MED ORDER — ROCURONIUM BROMIDE 10 MG/ML (PF) SYRINGE
PREFILLED_SYRINGE | INTRAVENOUS | Status: AC
  Filled 2020-06-26: qty 10

## 2020-06-26 MED ORDER — TRANEXAMIC ACID-NACL 1000-0.7 MG/100ML-% IV SOLN
1000.0000 mg | Freq: Once | INTRAVENOUS | Status: AC
  Administered 2020-06-26: 13:00:00 1000 mg via INTRAVENOUS
  Filled 2020-06-26: qty 100

## 2020-06-26 MED ORDER — TRAMADOL HCL 50 MG PO TABS
50.0000 mg | ORAL_TABLET | Freq: Four times a day (QID) | ORAL | Status: DC | PRN
  Administered 2020-06-30 – 2020-07-01 (×2): 50 mg via ORAL
  Filled 2020-06-26 (×3): qty 1

## 2020-06-26 MED ORDER — ONDANSETRON HCL 4 MG/2ML IJ SOLN
4.0000 mg | Freq: Four times a day (QID) | INTRAMUSCULAR | Status: DC | PRN
  Administered 2020-06-29: 4 mg via INTRAVENOUS
  Filled 2020-06-26: qty 2

## 2020-06-26 MED ORDER — METOCLOPRAMIDE HCL 5 MG PO TABS
5.0000 mg | ORAL_TABLET | Freq: Three times a day (TID) | ORAL | Status: DC | PRN
  Filled 2020-06-26: qty 2

## 2020-06-26 MED ORDER — FENTANYL CITRATE (PF) 250 MCG/5ML IJ SOLN
INTRAMUSCULAR | Status: AC
  Filled 2020-06-26: qty 5

## 2020-06-26 MED ORDER — LIDOCAINE 2% (20 MG/ML) 5 ML SYRINGE
INTRAMUSCULAR | Status: AC
  Filled 2020-06-26: qty 10

## 2020-06-26 MED ORDER — ADULT MULTIVITAMIN W/MINERALS CH
1.0000 | ORAL_TABLET | Freq: Every day | ORAL | Status: DC
  Administered 2020-06-27 – 2020-07-07 (×11): 1 via ORAL
  Filled 2020-06-26 (×12): qty 1

## 2020-06-26 MED ORDER — TRANEXAMIC ACID-NACL 1000-0.7 MG/100ML-% IV SOLN
1000.0000 mg | INTRAVENOUS | Status: AC
  Administered 2020-06-26: 09:00:00 1000 mg via INTRAVENOUS
  Filled 2020-06-26: qty 100

## 2020-06-26 MED ORDER — EPHEDRINE 5 MG/ML INJ
INTRAVENOUS | Status: AC
  Filled 2020-06-26: qty 10

## 2020-06-26 MED ORDER — LACTATED RINGERS IV SOLN: INTRAVENOUS | Status: DC

## 2020-06-26 MED ORDER — PHENOL 1.4 % MT LIQD: 1.0000 | OROMUCOSAL | Status: DC | PRN

## 2020-06-26 MED ORDER — LIDOCAINE 2% (20 MG/ML) 5 ML SYRINGE
INTRAMUSCULAR | Status: DC | PRN
  Administered 2020-06-26: 40 mg via INTRAVENOUS

## 2020-06-26 MED ORDER — MENTHOL 3 MG MT LOZG: 1.0000 | LOZENGE | OROMUCOSAL | Status: DC | PRN

## 2020-06-26 MED ORDER — BISACODYL 10 MG RE SUPP
10.0000 mg | Freq: Every day | RECTAL | Status: DC | PRN
  Administered 2020-06-30: 10 mg via RECTAL
  Filled 2020-06-26: qty 1

## 2020-06-26 MED ORDER — ONDANSETRON HCL 4 MG PO TABS
4.0000 mg | ORAL_TABLET | Freq: Four times a day (QID) | ORAL | Status: DC | PRN
  Filled 2020-06-26: qty 1

## 2020-06-26 MED ORDER — HYDRALAZINE HCL 20 MG/ML IJ SOLN: 5.0000 mg | INTRAMUSCULAR | Status: DC | PRN

## 2020-06-26 MED ORDER — CHLORHEXIDINE GLUCONATE 4 % EX LIQD: 60.0000 mL | Freq: Once | CUTANEOUS | Status: DC

## 2020-06-26 MED ORDER — ONDANSETRON HCL 4 MG/2ML IJ SOLN
INTRAMUSCULAR | Status: DC | PRN
  Administered 2020-06-26: 4 mg via INTRAVENOUS

## 2020-06-26 MED ORDER — ALBUMIN HUMAN 5 % IV SOLN: INTRAVENOUS | Status: DC | PRN

## 2020-06-26 MED ORDER — ONDANSETRON HCL 4 MG/2ML IJ SOLN: 4.0000 mg | Freq: Four times a day (QID) | INTRAMUSCULAR | Status: DC | PRN

## 2020-06-26 MED ORDER — CEFAZOLIN SODIUM-DEXTROSE 2-4 GM/100ML-% IV SOLN
2.0000 g | Freq: Four times a day (QID) | INTRAVENOUS | Status: DC
  Administered 2020-06-26: 15:00:00 2 g via INTRAVENOUS
  Filled 2020-06-26 (×2): qty 100

## 2020-06-26 MED ORDER — ALUM & MAG HYDROXIDE-SIMETH 200-200-20 MG/5ML PO SUSP: 15.0000 mL | ORAL | Status: DC | PRN

## 2020-06-26 MED ORDER — METOCLOPRAMIDE HCL 5 MG/ML IJ SOLN
5.0000 mg | Freq: Three times a day (TID) | INTRAMUSCULAR | Status: DC | PRN
Start: 2020-06-26 — End: 2020-07-02

## 2020-06-26 MED ORDER — POLYETHYLENE GLYCOL 3350 17 G PO PACK
17.0000 g | PACK | Freq: Two times a day (BID) | ORAL | Status: DC
  Administered 2020-06-26 – 2020-07-07 (×18): 17 g via ORAL
  Filled 2020-06-26 (×20): qty 1

## 2020-06-26 MED ORDER — SUGAMMADEX SODIUM 200 MG/2ML IV SOLN
INTRAVENOUS | Status: DC | PRN
  Administered 2020-06-26: 125 mg via INTRAVENOUS

## 2020-06-26 MED ORDER — OXYCODONE HCL 5 MG/5ML PO SOLN: 5.0000 mg | Freq: Once | ORAL | Status: DC | PRN

## 2020-06-26 MED ORDER — FENTANYL CITRATE (PF) 100 MCG/2ML IJ SOLN
25.0000 ug | INTRAMUSCULAR | Status: DC | PRN
  Administered 2020-06-26: 25 ug via INTRAVENOUS

## 2020-06-26 MED ORDER — SODIUM CHLORIDE 0.9 % IV SOLN: INTRAVENOUS | Status: DC

## 2020-06-26 MED ORDER — POVIDONE-IODINE 10 % EX SWAB: 2.0000 "application " | Freq: Once | CUTANEOUS | Status: DC

## 2020-06-26 MED ORDER — CEFAZOLIN SODIUM-DEXTROSE 2-4 GM/100ML-% IV SOLN
2.0000 g | INTRAVENOUS | Status: AC
  Administered 2020-06-26: 09:00:00 2 g via INTRAVENOUS
  Filled 2020-06-26 (×2): qty 100

## 2020-06-26 SURGICAL SUPPLY — 56 items
BLADE SAW SGTL 18X1.27X75 (BLADE) ×2 IMPLANT
COVER SURGICAL LIGHT HANDLE (MISCELLANEOUS) ×2 IMPLANT
COVER WAND RF STERILE (DRAPES) ×2 IMPLANT
DERMABOND ADVANCED (GAUZE/BANDAGES/DRESSINGS) ×1
DERMABOND ADVANCED .7 DNX12 (GAUZE/BANDAGES/DRESSINGS) ×1 IMPLANT
DRAPE IMP U-DRAPE 54X76 (DRAPES) ×2 IMPLANT
DRAPE INCISE IOBAN 66X45 STRL (DRAPES) ×2 IMPLANT
DRAPE INCISE IOBAN 85X60 (DRAPES) ×2 IMPLANT
DRAPE ORTHO SPLIT 77X108 STRL (DRAPES) ×4
DRAPE SURG ORHT 6 SPLT 77X108 (DRAPES) ×2 IMPLANT
DRAPE U-SHAPE 47X51 STRL (DRAPES) ×2 IMPLANT
DRSG AQUACEL AG ADV 3.5X10 (GAUZE/BANDAGES/DRESSINGS) ×2 IMPLANT
DURAPREP 26ML APPLICATOR (WOUND CARE) ×2 IMPLANT
ELECT BLADE 4.0 EZ CLEAN MEGAD (MISCELLANEOUS) ×2
ELECT REM PT RETURN 9FT ADLT (ELECTROSURGICAL) ×2
ELECTRODE BLDE 4.0 EZ CLN MEGD (MISCELLANEOUS) ×1 IMPLANT
ELECTRODE REM PT RTRN 9FT ADLT (ELECTROSURGICAL) ×1 IMPLANT
EVACUATOR 1/8 PVC DRAIN (DRAIN) IMPLANT
FACESHIELD WRAPAROUND (MASK) ×4 IMPLANT
GLOVE BIOGEL PI IND STRL 7.5 (GLOVE) ×1 IMPLANT
GLOVE BIOGEL PI IND STRL 8.5 (GLOVE) ×2 IMPLANT
GLOVE BIOGEL PI INDICATOR 7.5 (GLOVE) ×1
GLOVE BIOGEL PI INDICATOR 8.5 (GLOVE) ×2
GLOVE ECLIPSE 8.0 STRL XLNG CF (GLOVE) ×2 IMPLANT
GLOVE ORTHO TXT STRL SZ7.5 (GLOVE) ×2 IMPLANT
GOWN STRL REUS W/ TWL LRG LVL3 (GOWN DISPOSABLE) ×3 IMPLANT
GOWN STRL REUS W/TWL 2XL LVL3 (GOWN DISPOSABLE) ×2 IMPLANT
GOWN STRL REUS W/TWL LRG LVL3 (GOWN DISPOSABLE) ×6
HANDPIECE INTERPULSE COAX TIP (DISPOSABLE)
HEAD FEM UNIPOLAR 44 OD STRL (Hips) ×2 IMPLANT
IMMOBILIZER KNEE 22 UNIV (SOFTGOODS) ×2 IMPLANT
KIT BASIN OR (CUSTOM PROCEDURE TRAY) ×2 IMPLANT
KIT TURNOVER KIT B (KITS) ×2 IMPLANT
MANIFOLD NEPTUNE II (INSTRUMENTS) ×2 IMPLANT
NS IRRIG 1000ML POUR BTL (IV SOLUTION) ×2 IMPLANT
PACK TOTAL JOINT (CUSTOM PROCEDURE TRAY) ×2 IMPLANT
PACK UNIVERSAL I (CUSTOM PROCEDURE TRAY) ×2 IMPLANT
PAD ARMBOARD 7.5X6 YLW CONV (MISCELLANEOUS) ×4 IMPLANT
PENCIL BUTTON HOLSTER BLD 10FT (ELECTRODE) ×2 IMPLANT
SET HNDPC FAN SPRY TIP SCT (DISPOSABLE) IMPLANT
SPACER FEM TAPERED +5 12/14 (Hips) ×2 IMPLANT
SPONGE LAP 4X18 RFD (DISPOSABLE) ×4 IMPLANT
STEM FEMORAL SZ 5MM STD ACTIS (Stem) ×2 IMPLANT
SUT MNCRL AB 4-0 PS2 18 (SUTURE) IMPLANT
SUT VIC AB 1 CT1 27 (SUTURE) ×2
SUT VIC AB 1 CT1 27XBRD ANBCTR (SUTURE) ×1 IMPLANT
SUT VIC AB 2-0 CT1 27 (SUTURE) ×4
SUT VIC AB 2-0 CT1 TAPERPNT 27 (SUTURE) ×2 IMPLANT
SUT VLOC 180 0 24IN GS25 (SUTURE) ×2 IMPLANT
SYR BULB IRRIG 60ML STRL (SYRINGE) ×2 IMPLANT
TOWEL GREEN STERILE (TOWEL DISPOSABLE) ×2 IMPLANT
TOWEL GREEN STERILE FF (TOWEL DISPOSABLE) ×2 IMPLANT
TRAY FOLEY W/BAG SLVR 14FR (SET/KITS/TRAYS/PACK) IMPLANT
TUBE CONNECTING 12X1/4 (SUCTIONS) ×2 IMPLANT
WATER STERILE IRR 1000ML POUR (IV SOLUTION) ×2 IMPLANT
YANKAUER SUCT BULB TIP NO VENT (SUCTIONS) ×2 IMPLANT

## 2020-06-26 NOTE — Consult Note (Signed)
Reason for Consult: right hip fracture Referring Physician: Marland Mcalpine, MD  Suzanne Hall is an 86 y.o. female.  HPI:  PCP: No primary care provider on file.  Patient coming from: Memory care unit.  Most of the history was obtained from the ER physician.  Patient has dementia.  Chief Complaint: Unwitnessed fall.  HPI: Suzanne Hall is a 86 y.o. female with history of dementia was brought to the ER after patient was found to have unwitnessed fall and was found on the floor.  The exact circumstances of the fall is not clear.  After the fall patient was complaining of right hip pain.  ED Course: In the ER patient's x-ray showed right hip fracture and also CT head shows frontal scalp hematoma.  Labs are significant for sodium of 128 and no old labs to compare.  WBC count 11.8 Covid testing negative EKG shows sinus tachycardia with a first-degree AV block.    Orthopaeidcs consulted for definitive management of her hip   Past Medical History:  Diagnosis Date  . CKD (chronic kidney disease)   . Dementia (HCC)   . Hyperlipidemia   . Osteopenia   . Renal disorder     History reviewed. No pertinent surgical history.  Family History  Problem Relation Age of Onset  . Heart disease Father     Social History:  reports that she has never smoked. She has never used smokeless tobacco. She reports that she does not drink alcohol and does not use drugs.  Allergies: No Known Allergies  Medications: I have reviewed the patient's current medications. Continuous:   Results for orders placed or performed during the hospital encounter of 06/25/20 (from the past 24 hour(s))  CBC with Differential     Status: Abnormal   Collection Time: 06/25/20  7:48 PM  Result Value Ref Range   WBC 11.8 (H) 4.0 - 10.5 K/uL   RBC 4.10 3.87 - 5.11 MIL/uL   Hemoglobin 12.6 12.0 - 15.0 g/dL   HCT 62.9 52.8 - 41.3 %   MCV 90.0 80.0 - 100.0 fL   MCH 30.7 26.0 - 34.0 pg   MCHC 34.1 30.0 - 36.0 g/dL   RDW  24.4 01.0 - 27.2 %   Platelets 283 150 - 400 K/uL   nRBC 0.0 0.0 - 0.2 %   Neutrophils Relative % 81 %   Neutro Abs 9.6 (H) 1.7 - 7.7 K/uL   Lymphocytes Relative 7 %   Lymphs Abs 0.9 0.7 - 4.0 K/uL   Monocytes Relative 10 %   Monocytes Absolute 1.1 (H) 0.1 - 1.0 K/uL   Eosinophils Relative 1 %   Eosinophils Absolute 0.1 0.0 - 0.5 K/uL   Basophils Relative 0 %   Basophils Absolute 0.0 0.0 - 0.1 K/uL   Immature Granulocytes 1 %   Abs Immature Granulocytes 0.08 (H) 0.00 - 0.07 K/uL  Basic metabolic panel     Status: Abnormal   Collection Time: 06/25/20  7:48 PM  Result Value Ref Range   Sodium 128 (L) 135 - 145 mmol/L   Potassium 4.2 3.5 - 5.1 mmol/L   Chloride 93 (L) 98 - 111 mmol/L   CO2 23 22 - 32 mmol/L   Glucose, Bld 115 (H) 70 - 99 mg/dL   BUN 24 (H) 8 - 23 mg/dL   Creatinine, Ser 5.36 0.44 - 1.00 mg/dL   Calcium 9.4 8.9 - 64.4 mg/dL   GFR, Estimated >03 >47 mL/min   Anion gap 12 5 -  15  Resp Panel by RT-PCR (Flu A&B, Covid) Nasopharyngeal Swab     Status: None   Collection Time: 06/25/20  8:18 PM   Specimen: Nasopharyngeal Swab; Nasopharyngeal(NP) swabs in vial transport medium  Result Value Ref Range   SARS Coronavirus 2 by RT PCR NEGATIVE NEGATIVE   Influenza A by PCR NEGATIVE NEGATIVE   Influenza B by PCR NEGATIVE NEGATIVE  CBG monitoring, ED     Status: Abnormal   Collection Time: 06/25/20 11:28 PM  Result Value Ref Range   Glucose-Capillary 157 (H) 70 - 99 mg/dL  CBC     Status: Abnormal   Collection Time: 06/26/20  4:13 AM  Result Value Ref Range   WBC 14.9 (H) 4.0 - 10.5 K/uL   RBC 4.42 3.87 - 5.11 MIL/uL   Hemoglobin 13.0 12.0 - 15.0 g/dL   HCT 60.7 37.1 - 06.2 %   MCV 89.6 80.0 - 100.0 fL   MCH 29.4 26.0 - 34.0 pg   MCHC 32.8 30.0 - 36.0 g/dL   RDW 69.4 85.4 - 62.7 %   Platelets 267 150 - 400 K/uL   nRBC 0.0 0.0 - 0.2 %  Basic metabolic panel     Status: Abnormal   Collection Time: 06/26/20  4:13 AM  Result Value Ref Range   Sodium 126 (L) 135 - 145  mmol/L   Potassium 3.8 3.5 - 5.1 mmol/L   Chloride 93 (L) 98 - 111 mmol/L   CO2 22 22 - 32 mmol/L   Glucose, Bld 124 (H) 70 - 99 mg/dL   BUN 20 8 - 23 mg/dL   Creatinine, Ser 0.35 0.44 - 1.00 mg/dL   Calcium 9.1 8.9 - 00.9 mg/dL   GFR, Estimated >38 >18 mL/min   Anion gap 11 5 - 15  Basic metabolic panel     Status: Abnormal   Collection Time: 06/26/20  6:47 AM  Result Value Ref Range   Sodium 126 (L) 135 - 145 mmol/L   Potassium 3.8 3.5 - 5.1 mmol/L   Chloride 93 (L) 98 - 111 mmol/L   CO2 24 22 - 32 mmol/L   Glucose, Bld 132 (H) 70 - 99 mg/dL   BUN 18 8 - 23 mg/dL   Creatinine, Ser 2.99 0.44 - 1.00 mg/dL   Calcium 9.3 8.9 - 37.1 mg/dL   GFR, Estimated >69 >67 mL/min   Anion gap 9 5 - 15  Glucose, capillary     Status: Abnormal   Collection Time: 06/26/20  6:50 AM  Result Value Ref Range   Glucose-Capillary 141 (H) 70 - 99 mg/dL    X-ray: CLINICAL DATA:  Right hip fracture.  EXAM: CHEST  1 VIEW  COMPARISON:  None.  FINDINGS: The heart size and mediastinal contours are within normal limits. Both lungs are clear. The visualized skeletal structures are unremarkable.  IMPRESSION: No active disease.   Electronically Signed   By: Lupita Raider M.D.  ROS  Dementia Otherwise per HPI  Blood pressure (!) 154/76, pulse 92, temperature 99.3 F (37.4 C), temperature source Oral, resp. rate 17, height 5\' 5"  (1.651 m), weight 51.3 kg, SpO2 94 %.  Physical Exam  Awake, alert Eyes: Anicteric no pallor. ENMT: No discharge from the ears eyes nose or mouth. Neck: No mass felt.  No neck rigidity. Respiratory: No rhonchi or crepitations. Cardiovascular: S1-S2 heard. Abdomen: Soft nontender bowel sounds present. Musculoskeletal: No edema.  Pain on moving right hip. RLE short and externally rotated Skin: No rash.  Neurologic: Alert awake oriented to time place and person.  Moves all extremities. Psychiatric: Appears normal.  Normal affect.   Assessment/Plan: 1.  Displaced right femoral neck fracture  Plan: Due to the nature of her fracture and baseline activity level I recommend that she have a hemiarthroplasty performed to assist with pain control and activity. I discussed this with her son who is [place] hunting with his son.  We discussed treatment options and recommendations and he agrees that this would be in her best interest NPO Consent will be signed in pre-op  [name] 06/26/2020, 7:23 AM

## 2020-06-26 NOTE — Progress Notes (Signed)
PROGRESS NOTE    Suzanne MostJoanne S Stegeman  ZOX:[id number] DOB: [birth year] DOA: 06/25/2020 PCP: Patient, No Pcp Per   Brief Narrative:  HPI per Dr. Midge MiniumArshad Kakrakandy on 06/25/20 Suzanne Hall is a [age] y.o. female with history of dementia was brought to the ER after patient was found to have unwitnessed fall and was found on the floor.  The exact circumstances of the fall is not clear.  After the fall patient was complaining of right hip pain.  ED Course: In the ER patient's x-ray showed right hip fracture and also CT head shows frontal scalp hematoma.  Labs are significant for sodium of 128 and no old labs to compare.  WBC count 11.8 Covid testing negative EKG shows sinus tachycardia with a first-degree AV block.  Dr. Victorino DikeHewitt on-call orthopedic surgeon has been consulted and plan is to have surgery in the morning.  **Interim History Patient underwent a right hip hemiarthroplasty utilizing the DePuy component By Dr. Charlann Boxerlin.she went back to the floor and she is doing okay but does complain of significant hip pain.  Assessment & Plan:   Principal Problem:   Closed right hip fracture (HCC) Active Problems:   Hyponatremia   Dementia (HCC)   Hip fracture (HCC)  Right hip fracture unwitnessed fall.   -Orthopedic surgery was consulted and she kept n.p.o. after midnight for surgical intervention -She underwent a right hip hemiarthroplasty today done by Dr. Horton FinerIllness- -continue pain control with acetaminophen, fentanyl was given IV once, IV morphine, p.o. tramadol 50 to 100 mg every 6 hours for severe pain, methocarbamol and bowel regimen with bisacodyl 10 mg rectally as needed for moderate constipation and also is on MiraLAX 17 g p.o. twice daily -Head CT done and showed "Small right frontal scalp hematoma. No acute intracranial process." -VTE prophylaxis per orthopedic surgery and they are recommending aspirin 81 mg p.o. twice daily -Antiemetics with ondansetron and metoclopramide if ineffective -we will  need PT OT evaluation for further disposition planning  Hyponatremia  -cause not clear.  Will trend metabolic panel check urine studies including urine sodium osmolality check TSH which was 3.38 and cortisol level was 29.2.  -Sodium level is 126 and will repeat again tomorrow -Currently getting normal saline 100 mils per hour  Leukocytosis -In the setting of her fall and likely urinary tract infection -Patient WBC was 14.9 and will need to continue monitor and trend and repeat CMP in a.m. -Urinalysis done and showed a hazy appearance with moderate hemoglobin, large leukocytes, positive nitrites, many bacteria, 0-5 squamous epithelial cells, greater than 50 WBCs  -Check urine culture unfortunately she received preoperative antibiotics with cefazolin and unsure whether her urine culture will be of any value now -empirically start her on IV ceftriaxone after urine cultures been obtained  Elevated blood pressure readings  -we will keep patient on as needed IV hydralazine.  Follow blood pressure trends. -Last blood pressure reading was 1 -Continue to monitor blood pressures per protocol  History of dementia. -Placed on delirium precautions  Scalp hematoma  -seen in the CAT scan.  Hyperglycemia  -Patient's blood sugar ranging from 115-132 1 daily BMPs  -Check hemoglobin A1c in a.m. We will continue monitor blood sugars carefully and if necessary will place on sensitive NovoLog sign scale insulin AC   DVT prophylaxis: SCDs, aspirin 81 mg p.o. twice daily Code Status: FULL CODE Family Communication: No family present at bedside Disposition Plan: Pending Ortho clearance and evaluation by PT OT  Status is: Inpatient  Remains inpatient  appropriate because:Unsafe d/c plan, IV treatments appropriate due to intensity of illness or inability to take PO and Inpatient level of care appropriate due to severity of illness   Dispo: The patient is from: Home              Anticipated d/c is to:  TBD              Anticipated d/c date is: 2 days              Patient currently is not medically stable to d/c.  Consultants:   Orthopedic Surgery   Procedures:  PROCEDURE:  right hip hemiarthroplasty utilizing DePuy component, size 5 standard Actis stem with a 44 unipolar ball with a +5 adapter  Antimicrobials:  Anti-infectives (From admission, onward)   Start     Dose/Rate Route Frequency Ordered Stop   06/26/20 0845  ceFAZolin (ANCEF) IVPB 2g/100 mL premix        2 g 200 mL/hr over 30 Minutes Intravenous On call to O.R. 06/26/20 0756 06/27/20 0559       Subjective: Seen and examined after her surgical intervention and she is still complaining of some right hip pain.  She is little agitated and nursing states that she try to get out of bed.  She appears confused at this time.  No nausea or vomiting.  Pain control per Ortho and will start her on a diet and have PT OT evaluate.  No family present at bedside  Objective: Vitals:   06/26/20 0445 06/26/20 0500 06/26/20 0516 06/26/20 0819  BP: (!) 158/74  (!) 154/76   Pulse: 83 92    Resp: 16 17    Temp: 98.4 F (36.9 C) 99.3 F (37.4 C)    TempSrc: Oral Oral    SpO2: 94% 94%    Weight:    51.3 kg  Height:    5\' 5"  (1.651 m)   No intake or output data in the 24 hours ending 06/26/20 0834 Filed Weights   06/25/20 1940 06/26/20 0819  Weight: 51.3 kg 51.3 kg   Examination: Physical Exam:  Constitutional: Thin elderly slightly confused Caucasian female and mild distress appears uncomfortable  Eyes: Lids normal and sclerae anicteric ENMT: External Ears, Nose appear normal. Grossly normal hearing.  Neck: Appears normal, supple, no cervical masses, normal ROM, no appreciable thyromegaly Respiratory: Diminished to auscultation bilaterally, no wheezing, rales, rhonchi or crackles.  Cardiovascular: RRR, no murmurs / rubs / gallops. S1 and S2 auscultated.  Abdomen: Soft, non-tender, non-distended. Bowel sounds positive.  GU:  Deferred. Musculoskeletal: No clubbing / cyanosis of digits/nails. No joint deformity upper and lower extremities.  Has a left bandage Mepilex over her incision of her hip Skin: No rashes, lesions, ulcers on limited skin evaluation. No induration; Warm and dry.  Neurologic: CN 2-12 grossly intact with no focal deficits. Romberg sign and cerebellar reflexes not assessed.  Psychiatric: Impaired judgment and insight.  She is awake and alert but not fully oriented x 3.  Slightly agitated mood and appropriate affect.   Data Reviewed: I have personally reviewed following labs and imaging studies  CBC: Recent Labs  Lab 06/25/20 1948 06/26/20 0413  WBC 11.8* 14.9*  NEUTROABS 9.6*  --   HGB 12.6 13.0  HCT 36.9 39.6  MCV 90.0 89.6  PLT 283 267   Basic Metabolic Panel: Recent Labs  Lab 06/25/20 1948 06/26/20 0413 06/26/20 0647  NA 128* 126* 126*  K 4.2 3.8 3.8  CL 93* 93* 93*  CO2 23 22 24   GLUCOSE 115* 124* 132*  BUN 24* 20 18  CREATININE 0.83 0.77 0.77  CALCIUM 9.4 9.1 9.3   GFR: Estimated Creatinine Clearance: 41.6 mL/min (by C-G formula based on SCr of 0.77 mg/dL). Liver Function Tests: No results for input(s): AST, ALT, ALKPHOS, BILITOT, PROT, ALBUMIN in the last 168 hours. No results for input(s): LIPASE, AMYLASE in the last 168 hours. No results for input(s): AMMONIA in the last 168 hours. Coagulation Profile: No results for input(s): INR, PROTIME in the last 168 hours. Cardiac Enzymes: No results for input(s): CKTOTAL, CKMB, CKMBINDEX, TROPONINI in the last 168 hours. BNP (last 3 results) No results for input(s): PROBNP in the last 8760 hours. HbA1C: No results for input(s): HGBA1C in the last 72 hours. CBG: Recent Labs  Lab 06/25/20 2328 06/26/20 0650  GLUCAP 157* 141*   Lipid Profile: No results for input(s): CHOL, HDL, LDLCALC, TRIG, CHOLHDL, LDLDIRECT in the last 72 hours. Thyroid Function Tests: Recent Labs    06/26/20 0647  TSH 3.386   Anemia  Panel: No results for input(s): VITAMINB12, FOLATE, FERRITIN, TIBC, IRON, RETICCTPCT in the last 72 hours. Sepsis Labs: No results for input(s): PROCALCITON, LATICACIDVEN in the last 168 hours.  Recent Results (from the past 240 hour(s))  Resp Panel by RT-PCR (Flu A&B, Covid) Nasopharyngeal Swab     Status: None   Collection Time: 06/25/20  8:18 PM   Specimen: Nasopharyngeal Swab; Nasopharyngeal(NP) swabs in vial transport medium  Result Value Ref Range Status   SARS Coronavirus 2 by RT PCR NEGATIVE NEGATIVE Final    Comment: (NOTE) SARS-CoV-2 target nucleic acids are NOT DETECTED.  The SARS-CoV-2 RNA is generally detectable in upper respiratory specimens during the acute phase of infection. The lowest concentration of SARS-CoV-2 viral copies this assay can detect is 138 copies/mL. A negative result does not preclude SARS-Cov-2 infection and should not be used as the sole basis for treatment or other patient management decisions. A negative result may occur with  improper specimen collection/handling, submission of specimen other than nasopharyngeal swab, presence of viral mutation(s) within the areas targeted by this assay, and inadequate number of viral copies(<138 copies/mL). A negative result must be combined with clinical observations, patient history, and epidemiological information. The expected result is Negative.  Fact Sheet for Patients:  06/27/20  Fact Sheet for Healthcare Providers:  BloggerCourse.com  This test is no t yet approved or cleared by the [employer] FDA and  has been authorized for detection and/or diagnosis of SARS-CoV-2 by FDA under an Emergency Use Authorization (EUA). This EUA will remain  in effect (meaning this test can be used) for the duration of the COVID-19 declaration under Section 564(b)(1) of the Act, 21 U.S.C.section 360bbb-3(b)(1), unless the authorization is terminated  or  revoked sooner.       Influenza A by PCR NEGATIVE NEGATIVE Final   Influenza B by PCR NEGATIVE NEGATIVE Final    Comment: (NOTE) The Xpert Xpress SARS-CoV-2/FLU/RSV plus assay is intended as an aid in the diagnosis of influenza from Nasopharyngeal swab specimens and should not be used as a sole basis for treatment. Nasal washings and aspirates are unacceptable for Xpert Xpress SARS-CoV-2/FLU/RSV testing.  Fact Sheet for Patients: [place]  Fact Sheet for Healthcare Providers: BloggerCourse.com  This test is not yet approved or cleared by the [employer].[place] FDA and has been authorized for detection and/or diagnosis of SARS-CoV-2 by FDA under an Emergency Use Authorization (EUA). This EUA will remain in effect (  meaning this test can be used) for the duration of the COVID-19 declaration under Section 564(b)(1) of the Act, 21 U.S.C. section 360bbb-3(b)(1), unless the authorization is terminated or revoked.  Performed at [facility], [address], [place], [place] [address]      RN Pressure Injury Documentation:     Estimated body mass index is 18.8 kg/m as calculated from the following:   Height as of this encounter: 5\' 5"  (1.651 m).   Weight as of this encounter: 51.3 kg.  Malnutrition Type:   Malnutrition Characteristics:   Nutrition Interventions:     Radiology Studies: DG Chest 1 View  Result Date: 06/25/2020 CLINICAL DATA:  Right hip fracture. EXAM: CHEST  1 VIEW COMPARISON:  None. FINDINGS: The heart size and mediastinal contours are within normal limits. Both lungs are clear. The visualized skeletal structures are unremarkable. IMPRESSION: No active disease. Electronically Signed   By: 06/27/2020 M.D.   On: 06/25/2020 20:15   CT Head Wo Contrast  Result Date: 06/25/2020 CLINICAL DATA:  Un witnessed fall, head trauma EXAM: CT HEAD WITHOUT CONTRAST TECHNIQUE: Contiguous axial images were  obtained from the base of the skull through the vertex without intravenous contrast. COMPARISON:  None. FINDINGS: Brain: Encephalomalacia within the anterior aspect left temporal lobe may reflect previous infarct or trauma. No acute infarct or hemorrhage. Lateral ventricles and midline structures are unremarkable. No acute extra-axial fluid collections. No mass effect. Vascular: No hyperdense vessel or unexpected calcification. Skull: Minimal right frontal scalp edema. Negative for fracture or focal lesion. Sinuses/Orbits: No acute finding. Other: None. IMPRESSION: 1. Small right frontal scalp hematoma. 2. No acute intracranial process. Electronically Signed   By: 06/27/2020 M.D.   On: 06/25/2020 21:08   CT Cervical Spine Wo Contrast  Result Date: 06/25/2020 CLINICAL DATA:  Un witnessed fall EXAM: CT CERVICAL SPINE WITHOUT CONTRAST TECHNIQUE: Multidetector CT imaging of the cervical spine was performed without intravenous contrast. Multiplanar CT image reconstructions were also generated. COMPARISON:  None. FINDINGS: Alignment: There is mild retrolisthesis of C5 on C6. Otherwise alignment is anatomic. Skull base and vertebrae: No acute fracture. No primary bone lesion or focal pathologic process. Soft tissues and spinal canal: No prevertebral fluid or swelling. No visible canal hematoma. Disc levels: There is mild spondylosis at C3-4, resulting in mild left neural foraminal encroachment. There is moderate spondylosis at C5-6, resulting in symmetrical neural foraminal encroachment. Mild spondylosis at C6-7 results in left-sided neural foraminal encroachment. There is diffuse facet hypertrophy throughout the cervical spine. Upper chest: Airway is patent.  Lung apices are clear. Other: Reconstructed images demonstrate no additional findings. IMPRESSION: 1. Multilevel cervical spondylosis and facet hypertrophy. 2. No acute cervical spine fracture. Electronically Signed   By: 06/27/2020 M.D.   On: 06/25/2020  21:11   DG Hip Unilat W or Wo Pelvis 2-3 Views Right  Result Date: 06/25/2020 CLINICAL DATA:  Right hip pain after fall. EXAM: DG HIP (WITH OR WITHOUT PELVIS) 2-3V RIGHT COMPARISON:  None. FINDINGS: Severely displaced proximal right femoral neck fracture is noted. Left hip is unremarkable. IMPRESSION: Severely displaced proximal right femoral neck fracture. Electronically Signed   By: 06/27/2020 M.D.   On: 06/25/2020 20:15   Scheduled Meds: . chlorhexidine  60 mL Topical Once  . povidone-iodine  2 application Topical Once   Continuous Infusions: .  ceFAZolin (ANCEF) IV    . lactated ringers 10 mL/hr at 06/26/20 0821  . tranexamic acid  LOS: 1 day   Kerney Elbe, DO [facility] PAGER is on AMION  If 7PM-7AM, please contact night-coverage [email or web address]

## 2020-06-26 NOTE — Anesthesia Procedure Notes (Signed)
Procedure Name: Intubation Date/Time: 06/26/2020 8:44 AM Performed by: Waynard Edwards, CRNA Pre-anesthesia Checklist: Patient identified, Emergency Drugs available, Suction available and Patient being monitored Patient Re-evaluated:Patient Re-evaluated prior to induction Oxygen Delivery Method: Circle system utilized Preoxygenation: Pre-oxygenation with 100% oxygen Induction Type: IV induction Ventilation: Mask ventilation without difficulty Laryngoscope Size: Miller and 2 Grade View: Grade I Tube type: Oral Tube size: 7.0 mm Number of attempts: 1 Airway Equipment and Method: Stylet Placement Confirmation: ETT inserted through vocal cords under direct vision,  positive ETCO2 and breath sounds checked- equal and bilateral Secured at: 22 cm Tube secured with: Tape Dental Injury: Teeth and Oropharynx as per pre-operative assessment

## 2020-06-26 NOTE — Progress Notes (Signed)
Initial Nutrition Assessment  DOCUMENTATION CODES:   Not applicable  INTERVENTION:   - Once diet advanced, Ensure Enlive po TID, each supplement provides 350 kcal and 20 grams of protein  - MVI with minerals daily  NUTRITION DIAGNOSIS:   Increased nutrient needs related to hip fracture,post-op healing as evidenced by estimated needs.  GOAL:   Patient will meet greater than or equal to 90% of their needs  MONITOR:   PO intake,Supplement acceptance,Labs,Weight trends,Skin  REASON FOR ASSESSMENT:   Consult Hip fracture protocol  ASSESSMENT:   86 year old female who presented to the ED on 12/29 after an unwitnessed fall. PMH of dementia, HLD. Pt found to have a right hip fracture.   12/30 - s/p right hip hemiarthroplasty  Pt in OR and unavailable at time of RD visit. Will attempt to obtain diet and weight history upon follow-up.  No weight history available in chart. Weight of 113 lbs on admission appears to be stated rather than measured. Given BMI of 18.8, suspect pt with some degree of malnutrition but RD unable to confirm at this time.  Pt has been NPO since admission pending OR today. No meal completions available. RD will order Ensure supplements and daily MVI for pt to take once diet advanced.  Medications reviewed.  Labs reviewed: sodium 126 CBG's: 141, 156  NUTRITION - FOCUSED PHYSICAL EXAM:  Unable to complete at this time. Pt in OR at time of RD visit.  Diet Order:   Diet Order            Diet NPO time specified Except for: Sips with Meds  Diet effective now                 EDUCATION NEEDS:   Not appropriate for education at this time  Skin:  Skin Assessment: Skin Integrity Issues: Incisions: right hip  Last BM:  06/26/20  Height:   Ht Readings from Last 1 Encounters:  06/26/20 5\' 5"  (1.651 m)    Weight:   Wt Readings from Last 1 Encounters:  06/26/20 51.3 kg    BMI:  Body mass index is 18.8 kg/m.  Estimated Nutritional  Needs:   Kcal:  1500-1700  Protein:  70-85 grams  Fluid:  1.5-1.7 L    06/28/20, MS, RD, LDN Inpatient Clinical Dietitian Please see AMiON for contact information.

## 2020-06-26 NOTE — Discharge Instructions (Signed)
INSTRUCTIONS AFTER JOINT REPLACEMENT   o Remove items at home which could result in a fall. This includes throw rugs or furniture in walking pathways o ICE to the affected joint every three hours while awake for 30 minutes at a time, for at least the first 3-5 days, and then as needed for pain and swelling.  Continue to use ice for pain and swelling. You may notice swelling that will progress down to the foot and ankle.  This is normal after surgery.  Elevate your leg when you are not up walking on it.   o Continue to use the breathing machine you got in the hospital (incentive spirometer) which will help keep your temperature down.  It is common for your temperature to cycle up and down following surgery, especially at night when you are not up moving around and exerting yourself.  The breathing machine keeps your lungs expanded and your temperature down.   DIET:  As you were doing prior to hospitalization, we recommend a well-balanced diet.  DRESSING / WOUND CARE / SHOWERING  Keep the surgical dressing until follow up.  The dressing is water proof, so you can shower without any extra covering.  IF THE DRESSING FALLS OFF or the wound gets wet inside, change the dressing with sterile gauze.  Please use good hand washing techniques before changing the dressing.  Do not use any lotions or creams on the incision until instructed by your surgeon.    ACTIVITY  o Increase activity slowly as tolerated, but follow the weight bearing instructions below.   o No driving for 6 weeks or until further direction given by your physician.  You cannot drive while taking narcotics.  o No lifting or carrying greater than 10 lbs. until further directed by your surgeon. o Avoid periods of inactivity such as sitting longer than an hour when not asleep. This helps prevent blood clots.  o You may return to work once you are authorized by your doctor.     WEIGHT BEARING   Weight bearing as tolerated with assist  device (walker, cane, etc) as directed, use it as long as suggested by your surgeon or therapist, typically at least 4-6 weeks.   CONSTIPATION  Constipation is defined medically as fewer than three stools per week and severe constipation as less than one stool per week.  Even if you have a regular bowel pattern at home, your normal regimen is likely to be disrupted due to multiple reasons following surgery.  Combination of anesthesia, postoperative narcotics, change in appetite and fluid intake all can affect your bowels.   YOU MUST use at least one of the following options; they are listed in order of increasing strength to get the job done.  They are all available over the counter, and you may need to use some, POSSIBLY even all of these options:    Drink plenty of fluids (prune juice may be helpful) and high fiber foods Colace 100 mg by mouth twice a day  Senokot for constipation as directed and as needed Dulcolax (bisacodyl), take with full glass of water  Miralax (polyethylene glycol) once or twice a day as needed.  If you have tried all these things and are unable to have a bowel movement in the first 3-4 days after surgery call either your surgeon or your primary doctor.    If you experience loose stools or diarrhea, hold the medications until you stool forms back up.  If your symptoms do not get   better within 1 week or if they get worse, check with your doctor.  If you experience "the worst abdominal pain ever" or develop nausea or vomiting, please contact the office immediately for further recommendations for treatment.   ITCHING:  If you experience itching with your medications, try taking only a single pain pill, or even half a pain pill at a time.  You can also use Benadryl over the counter for itching or also to help with sleep.   TED HOSE STOCKINGS:  Use stockings on both legs until for at least 2 weeks or as directed by physician office. They may be removed at night for  sleeping.  MEDICATIONS:  See your medication summary on the "After Visit Summary" that nursing will review with you.  You may have some home medications which will be placed on hold until you complete the course of blood thinner medication.  It is important for you to complete the blood thinner medication as prescribed.  PRECAUTIONS:  If you experience chest pain or shortness of breath - call 911 immediately for transfer to the hospital emergency department.   If you develop a fever greater that 101 F, purulent drainage from wound, increased redness or drainage from wound, foul odor from the wound/dressing, or calf pain - CONTACT YOUR SURGEON.                                                   FOLLOW-UP APPOINTMENTS:  If you do not already have a post-op appointment, please call the office for an appointment to be seen by your surgeon.  Guidelines for how soon to be seen are listed in your "After Visit Summary", but are typically between 1-4 weeks after surgery.  OTHER INSTRUCTIONS:   Knee Replacement:  Do not place pillow under knee, focus on keeping the knee straight while resting. CPM instructions: 0-90 degrees, 2 hours in the morning, 2 hours in the afternoon, and 2 hours in the evening. Place foam block, curve side up under heel at all times except when in CPM or when walking.  DO NOT modify, tear, cut, or change the foam block in any way.   DENTAL ANTIBIOTICS:  In most cases prophylactic antibiotics for Dental procdeures after total joint surgery are not necessary.  Exceptions are as follows:  1. History of prior total joint infection  2. Severely immunocompromised (Organ Transplant, cancer chemotherapy, Rheumatoid biologic meds such as Humera)  3. Poorly controlled diabetes (A1C &gt; 8.0, blood glucose over 200)  If you have one of these conditions, contact your surgeon for an antibiotic prescription, prior to your dental procedure.   MAKE SURE YOU:  . Understand these  instructions.  . Get help right away if you are not doing well or get worse.    Thank you for letting us be a part of your medical care team.  It is a privilege we respect greatly.  We hope these instructions will help you stay on track for a fast and full recovery!    

## 2020-06-26 NOTE — Plan of Care (Signed)
  Problem: Nutrition: Goal: Adequate nutrition will be maintained Outcome: Progressing   Problem: Pain Managment: Goal: General experience of comfort will improve Outcome: Not Progressing   

## 2020-06-26 NOTE — Transfer of Care (Signed)
Immediate Anesthesia Transfer of Care Note  Patient: Suzanne Hall  Procedure(s) Performed: ARTHROPLASTY BIPOLAR HIP (HEMIARTHROPLASTY) (Right Hip)  Patient Location: PACU  Anesthesia Type:General  Level of Consciousness: drowsy  Airway & Oxygen Therapy: Patient Spontanous Breathing and Patient connected to nasal cannula oxygen  Post-op Assessment: Report given to RN and Post -op Vital signs reviewed and stable  Post vital signs: Reviewed and stable  Last Vitals:  Vitals Value Taken Time  BP 137/62 06/26/20 0951  Temp 36.4 C 06/26/20 0951  Pulse 70 06/26/20 0955  Resp 23 06/26/20 0954  SpO2 99 % 06/26/20 0955  Vitals shown include unvalidated device data.  Last Pain:  Vitals:   06/26/20 0500  TempSrc: Oral  PainSc:          Complications: No complications documented.

## 2020-06-26 NOTE — Progress Notes (Signed)
°   06/26/20 1431  Assess: MEWS Score  Temp 97.7 F (36.5 C)  BP 90/69  Pulse Rate 94  Resp 18  SpO2 97 %  O2 Device Room Air  Assess: MEWS Score  MEWS Temp 0  MEWS Systolic 1  MEWS Pulse 0  MEWS RR 0  MEWS LOC 1  MEWS Score 2  MEWS Score Color Yellow  Assess: if the MEWS score is Yellow or Red  Were vital signs taken at a resting state? Yes  Focused Assessment Change from prior assessment (see assessment flowsheet)  Early Detection of Sepsis Score *See Row Information* Low  MEWS guidelines implemented *See Row Information* No, vital signs rechecked  Treat  MEWS Interventions Other (Comment) (no necessary interventions at this time)  Patient assessed with soft BP of 90/69 @1321 .  Vital signs re-checked an hour later with an improved result of 131/74 with any interventions necessary.  Patient is asymptomatic with no complaints noted at this time.  Will con't to monitor.

## 2020-06-26 NOTE — Op Note (Signed)
NAME:  Suzanne Hall                ACCOUNT NO.:  [id number]   MEDICAL RECORD NO.: [id number]   LOCATION:  1435                         FACILITY:  [facility]   DATE OF BIRTH:  [birth year]  PHYSICIAN:  Madlyn Frankel. Charlann Boxer, M.D.     DATE OF PROCEDURE:  06/26/20                               OPERATIVE REPORT     PREOPERATIVE DIAGNOSIS:  right displaced femoral neck fracture.   POSTOPERATIVE DIAGNOSIS:  right displaced femoral neck fracture.   PROCEDURE:  right hip hemiarthroplasty utilizing DePuy component, size 5 standard Actis stem with a 44 unipolar ball with a +5 adapter.   SURGEON:  Madlyn Frankel. Charlann Boxer, MD   ASSISTANT:  Dennie Bible, PA-C.   ANESTHESIA:  General.   SPECIMENS:  None.   DRAINS:  None.   BLOOD LOSS:  About 300 cc.   COMPLICATIONS:  None.   INDICATION OF PROCEDURE:  Ms Imbert is a 86 year old female with dementia who lives in a memory care unit. She was found on the ground after unwitnessed fall.  She had complaints of right hip pain and was brought to [facility] for evaluation and mangement.  She was found to have a right femoral neck fracture.  Per fracture protocols she was admitted to Hospitalist and Ortho consulted for management.  I reviewed the injury and radiographic findings with her son.  I feel that for pain control and functional capabilities that hip hemiarthroplasty was indicated.  Consent was obtained after reviewing risks of infection, DVT, component failure, and need for revision surgery.   PROCEDURE IN DETAIL:  The patient was brought to the operative theater. Once adequate anesthesia, preoperative antibiotics, 2 g of Ancef administered, the patient was positioned into the left lateral decubitus position with the right side up.  The right lower extremity was then prepped and draped in sterile fashion.  A time-out was performed identifying the patient, planned procedure, and extremity.   A lateral incision was made off the proximal trochanter. Sharp dissection  was carried down to the iliotibial band and gluteal fascia. The gluteal fascia was then incised for posterior approach.  The short external rotators were taken down separate from the posterior capsule. An L capsulotomy was made preserving the posterior leaflet for later anatomic repair. Fracture site was identified and after removing comminuted segments of the posterior femoral neck, the femoral head was removed without difficulty and measured on the back table  using the sizing rings and determined to be 44 mm in diameter.   The proximal femur was then exposed.  Retractors placed.  I then drilled, opened the proximal femur.  Then I hand reamed once and  Irrigated the canal to try to prevent fat emboli.  I began broaching the femur with a starter broach up to a size 5 broach with good medial and lateral metaphyseal fit without evidence of any torsion or movement.  A trial reduction was carried out with a standard neck and a +5 versus +0 adapter with a 44 mm ball.  The hip reduced nicely.  The leg lengths appeared to be equal compared to the down leg.   The hip went through a range of motion without  evidence of any subluxation or impingement.   Given these findings, the trial components removed.  The final 5 standard  Actis stem was opened.  After irrigating the canal, the final stem was impacted and sat at the level where the broach was. Based on this and the trial reduction, a +5 adapter was opened and impacted in the 44 mm unipolar ball onto a clean and dry trunnion.  The hip had been irrigated throughout the case and again at this point.  I re- Approximated the posterior capsule to the superior leaflet using a  #1 Vicryl.  The remainder of the wound was closed with #1 Vicryl in the iliotibial band and gluteal fascia, a  2-0 Vicryl in the sub-Q tissue and a running 4-0 Monocryl in the skin.  The hip was cleaned, dried, and dressed sterilely using Dermabond and Aquacel dressing.  She  was then brought to recovery room, extubated in stable condition, tolerating the procedure well.  Dennie Bible, PA-C was present and utilized as Geophysicist/field seismologist for the entire case from  Preoperative positioning to management of the contralateral extremity and retractors to  General facilitation of the procedure.  He was also involved with primary wound closure.         Madlyn Frankel Charlann Boxer, M.D.

## 2020-06-26 NOTE — Anesthesia Preprocedure Evaluation (Signed)
Anesthesia Evaluation  Patient identified by MRN, date of birth, ID band Patient confused    Reviewed: Allergy & Precautions, H&P , NPO status , Patient's Chart, lab work & pertinent test results  Airway Mallampati: II   Neck ROM: full    Dental   Pulmonary    breath sounds clear to auscultation       Cardiovascular  Rhythm:regular Rate:Normal  Hyperlipidemia Sodium 126 today   Neuro/Psych PSYCHIATRIC DISORDERS Dementia    GI/Hepatic   Endo/Other    Renal/GU      Musculoskeletal Hip fracture   Abdominal   Peds  Hematology   Anesthesia Other Findings   Reproductive/Obstetrics                             Anesthesia Physical Anesthesia Plan  ASA: III  Anesthesia Plan: General   Post-op Pain Management:    Induction: Intravenous  PONV Risk Score and Plan: 3 and Ondansetron, Dexamethasone and Treatment may vary due to age or medical condition  Airway Management Planned: Oral ETT  Additional Equipment:   Intra-op Plan:   Post-operative Plan: Extubation in OR  Informed Consent: I have reviewed the patients History and Physical, chart, labs and discussed the procedure including the risks, benefits and alternatives for the proposed anesthesia with the patient or authorized representative who has indicated his/her understanding and acceptance.       Plan Discussed with: CRNA, Anesthesiologist and Surgeon  Anesthesia Plan Comments:         Anesthesia Quick Evaluation

## 2020-06-26 NOTE — Consult Note (Signed)
Reason for Consult: Right hip pain Referring Physician: Dr. Donnalee Curry Suzanne Hall is an 86 y.o. female.  HPI: The patient is an 86 year old female with a past medical history significant for dementia.  She lives at [facility] in the memory care unit.  She does not recall the exact mechanism of injury but was found down.  She denies any pain elsewhere but notes pain in the right hip.  She was unable to bear weight and was brought to the emergency room.  Radiographs reveal a femoral neck fracture that is displaced.  She is admitted to the hospitalist service.  She is n.p.o. since yesterday.  She is not on any blood thinners.  No history of diabetes or smoking.  She complains of some mild soreness in the right hip.  Pain is worse with any motion.  I spoke with her son, Alannah Averhart, who provided her past medical history.  Past Medical History:  Diagnosis Date  . CKD (chronic kidney disease)   . Dementia (HCC)   . Hyperlipidemia   . Osteopenia   . Renal disorder     History reviewed. No pertinent surgical history.  Family History  Problem Relation Age of Onset  . Heart disease Father     Social History:  reports that she has never smoked. She has never used smokeless tobacco. She reports that she does not drink alcohol and does not use drugs.  Allergies: No Known Allergies  Medications: I have reviewed the patient's current medications.  Results for orders placed or performed during the hospital encounter of 06/25/20 (from the past 48 hour(s))  CBC with Differential     Status: Abnormal   Collection Time: 06/25/20  7:48 PM  Result Value Ref Range   WBC 11.8 (H) 4.0 - 10.5 K/uL   RBC 4.10 3.87 - 5.11 MIL/uL   Hemoglobin 12.6 12.0 - 15.0 g/dL   HCT 78.4 69.6 - 29.5 %   MCV 90.0 80.0 - 100.0 fL   MCH 30.7 26.0 - 34.0 pg   MCHC 34.1 30.0 - 36.0 g/dL   RDW 28.4 13.2 - 44.0 %   Platelets 283 150 - 400 K/uL   nRBC 0.0 0.0 - 0.2 %   Neutrophils Relative % 81 %   Neutro Abs 9.6  (H) 1.7 - 7.7 K/uL   Lymphocytes Relative 7 %   Lymphs Abs 0.9 0.7 - 4.0 K/uL   Monocytes Relative 10 %   Monocytes Absolute 1.1 (H) 0.1 - 1.0 K/uL   Eosinophils Relative 1 %   Eosinophils Absolute 0.1 0.0 - 0.5 K/uL   Basophils Relative 0 %   Basophils Absolute 0.0 0.0 - 0.1 K/uL   Immature Granulocytes 1 %   Abs Immature Granulocytes 0.08 (H) 0.00 - 0.07 K/uL    Comment: Performed at [facility], [address], [place], [place] [address]  Basic metabolic panel     Status: Abnormal   Collection Time: 06/25/20  7:48 PM  Result Value Ref Range   Sodium 128 (L) 135 - 145 mmol/L   Potassium 4.2 3.5 - 5.1 mmol/L   Chloride 93 (L) 98 - 111 mmol/L   CO2 23 22 - 32 mmol/L   Glucose, Bld 115 (H) 70 - 99 mg/dL    Comment: Glucose reference range applies only to samples taken after fasting for at least 8 hours.   BUN 24 (H) 8 - 23 mg/dL   Creatinine, Ser 5.36 0.44 - 1.00 mg/dL  Calcium 9.4 8.9 - 10.3 mg/dL   GFR, Estimated >40 >98 mL/min    Comment: (NOTE) Calculated using the CKD-EPI Creatinine Equation (2021)    Anion gap 12 5 - 15    Comment: Performed at [facility], [address], [place], [place] [address]  Resp Panel by RT-PCR (Flu A&B, Covid) Nasopharyngeal Swab     Status: None   Collection Time: 06/25/20  8:18 PM   Specimen: Nasopharyngeal Swab; Nasopharyngeal(NP) swabs in vial transport medium  Result Value Ref Range   SARS Coronavirus 2 by RT PCR NEGATIVE NEGATIVE    Comment: (NOTE) SARS-CoV-2 target nucleic acids are NOT DETECTED.  The SARS-CoV-2 RNA is generally detectable in upper respiratory specimens during the acute phase of infection. The lowest concentration of SARS-CoV-2 viral copies this assay can detect is 138 copies/mL. A negative result does not preclude SARS-Cov-2 infection and should not be used as the sole basis for treatment or other patient management decisions. A negative result may occur with  improper specimen collection/handling,  submission of specimen other than nasopharyngeal swab, presence of viral mutation(s) within the areas targeted by this assay, and inadequate number of viral copies(<138 copies/mL). A negative result must be combined with clinical observations, patient history, and epidemiological information. The expected result is Negative.  Fact Sheet for Patients:  BloggerCourse.com  Fact Sheet for Healthcare Providers:  SeriousBroker.it  This test is no t yet approved or cleared by the [place] FDA and  has been authorized for detection and/or diagnosis of SARS-CoV-2 by FDA under an Emergency Use Authorization (EUA). This EUA will remain  in effect (meaning this test can be used) for the duration of the COVID-19 declaration under Section 564(b)(1) of the Act, 21 U.S.C.section 360bbb-3(b)(1), unless the authorization is terminated  or revoked sooner.       Influenza A by PCR NEGATIVE NEGATIVE   Influenza B by PCR NEGATIVE NEGATIVE    Comment: (NOTE) The Xpert Xpress SARS-CoV-2/FLU/RSV plus assay is intended as an aid in the diagnosis of influenza from Nasopharyngeal swab specimens and should not be used as a sole basis for treatment. Nasal washings and aspirates are unacceptable for Xpert Xpress SARS-CoV-2/FLU/RSV testing.  Fact Sheet for Patients: BloggerCourse.com  Fact Sheet for Healthcare Providers: SeriousBroker.it  This test is not yet approved or cleared by the [place] FDA and has been authorized for detection and/or diagnosis of SARS-CoV-2 by FDA under an Emergency Use Authorization (EUA). This EUA will remain in effect (meaning this test can be used) for the duration of the COVID-19 declaration under Section 564(b)(1) of the Act, 21 U.S.C. section 360bbb-3(b)(1), unless the authorization is terminated or revoked.  Performed at [facility], [address]  [place], [place] [address]   CBG monitoring, ED     Status: Abnormal   Collection Time: 06/25/20 11:28 PM  Result Value Ref Range   Glucose-Capillary 157 (H) 70 - 99 mg/dL    Comment: Glucose reference range applies only to samples taken after fasting for at least 8 hours.  CBC     Status: Abnormal   Collection Time: 06/26/20  4:13 AM  Result Value Ref Range   WBC 14.9 (H) 4.0 - 10.5 K/uL   RBC 4.42 3.87 - 5.11 MIL/uL   Hemoglobin 13.0 12.0 - 15.0 g/dL   HCT 62.1 30.8 - 65.7 %   MCV 89.6 80.0 - 100.0 fL   MCH 29.4 26.0 - 34.0 pg   MCHC 32.8 30.0 -  36.0 g/dL   RDW 08.613.2 57.811.5 - 46.915.5 %   Platelets 267 150 - 400 K/uL   nRBC 0.0 0.0 - 0.2 %    Comment: Performed at [facility], [address] [place], [place] [address]  Basic metabolic panel     Status: Abnormal   Collection Time: 06/26/20  4:13 AM  Result Value Ref Range   Sodium 126 (L) 135 - 145 mmol/L   Potassium 3.8 3.5 - 5.1 mmol/L   Chloride 93 (L) 98 - 111 mmol/L   CO2 22 22 - 32 mmol/L   Glucose, Bld 124 (H) 70 - 99 mg/dL    Comment: Glucose reference range applies only to samples taken after fasting for at least 8 hours.   BUN 20 8 - 23 mg/dL   Creatinine, Ser 8.410.77 0.44 - 1.00 mg/dL   Calcium 9.1 8.9 - 32.410.3 mg/dL   GFR, Estimated >40>60 >10>60 mL/min    Comment: (NOTE) Calculated using the CKD-EPI Creatinine Equation (2021)    Anion gap 11 5 - 15    Comment: Performed at [facility], [address] [place], [place] [address]  Glucose, capillary     Status: Abnormal   Collection Time: 06/26/20  6:50 AM  Result Value Ref Range   Glucose-Capillary 141 (H) 70 - 99 mg/dL    Comment: Glucose reference range applies only to samples taken after fasting for at least 8 hours.    DG Chest 1 View  Result Date: 06/25/2020 CLINICAL DATA:  Right hip fracture. EXAM: CHEST  1 VIEW COMPARISON:  None. FINDINGS: The heart size and mediastinal contours are within normal limits. Both lungs are clear. The visualized skeletal  structures are unremarkable. IMPRESSION: No active disease. Electronically Signed   By: Lupita RaiderJames  Green Jr M.D.   On: 06/25/2020 20:15   CT Head Wo Contrast  Result Date: 06/25/2020 CLINICAL DATA:  Un witnessed fall, head trauma EXAM: CT HEAD WITHOUT CONTRAST TECHNIQUE: Contiguous axial images were obtained from the base of the skull through the vertex without intravenous contrast. COMPARISON:  None. FINDINGS: Brain: Encephalomalacia within the anterior aspect left temporal lobe may reflect previous infarct or trauma. No acute infarct or hemorrhage. Lateral ventricles and midline structures are unremarkable. No acute extra-axial fluid collections. No mass effect. Vascular: No hyperdense vessel or unexpected calcification. Skull: Minimal right frontal scalp edema. Negative for fracture or focal lesion. Sinuses/Orbits: No acute finding. Other: None. IMPRESSION: 1. Small right frontal scalp hematoma. 2. No acute intracranial process. Electronically Signed   By: Sharlet SalinaMichael  Brown M.D.   On: 06/25/2020 21:08   CT Cervical Spine Wo Contrast  Result Date: 06/25/2020 CLINICAL DATA:  Un witnessed fall EXAM: CT CERVICAL SPINE WITHOUT CONTRAST TECHNIQUE: Multidetector CT imaging of the cervical spine was performed without intravenous contrast. Multiplanar CT image reconstructions were also generated. COMPARISON:  None. FINDINGS: Alignment: There is mild retrolisthesis of C5 on C6. Otherwise alignment is anatomic. Skull base and vertebrae: No acute fracture. No primary bone lesion or focal pathologic process. Soft tissues and spinal canal: No prevertebral fluid or swelling. No visible canal hematoma. Disc levels: There is mild spondylosis at C3-4, resulting in mild left neural foraminal encroachment. There is moderate spondylosis at C5-6, resulting in symmetrical neural foraminal encroachment. Mild spondylosis at C6-7 results in left-sided neural foraminal encroachment. There is diffuse facet hypertrophy throughout the  cervical spine. Upper chest: Airway is patent.  Lung apices are clear. Other: Reconstructed images demonstrate no additional findings. IMPRESSION:  1. Multilevel cervical spondylosis and facet hypertrophy. 2. No acute cervical spine fracture. Electronically Signed   By: Sharlet Salina M.D.   On: 06/25/2020 21:11   DG Hip Unilat W or Wo Pelvis 2-3 Views Right  Result Date: 06/25/2020 CLINICAL DATA:  Right hip pain after fall. EXAM: DG HIP (WITH OR WITHOUT PELVIS) 2-3V RIGHT COMPARISON:  None. FINDINGS: Severely displaced proximal right femoral neck fracture is noted. Left hip is unremarkable. IMPRESSION: Severely displaced proximal right femoral neck fracture. Electronically Signed   By: Lupita Raider M.D.   On: 06/25/2020 20:15    Review of Systems no recent fever, chills, nausea, vomiting or changes in her appetite.  10 system review was otherwise negative. Blood pressure (!) 154/76, pulse 92, temperature 99.3 F (37.4 C), temperature source Oral, resp. rate 17, height 5\' 5"  (1.651 m), weight 51.3 kg, SpO2 94 %. Physical Exam Thin generally healthy-appearing elderly woman in no apparent distress.  Alert.  Oriented to person.  Extraocular motions are intact.  Respirations are unlabored.  No gross deformity or tenderness to palpation of her upper extremities or back.  Tender to palpation at the right hip.  Pain with internal and external rotation.  Pulses are palpable in the right foot.  Normal sensibility to light touch throughout the right lower extremity.  Active plantar flexion and dorsiflexion strength at the ankle and toes on the right.  No lymphadenopathy.   Assessment/Plan: Right femoral neck fracture -I explained the nature of the injury to the patient and her son by phone.  She is generally pretty active and stands and walks regularly in her facility.  The goal of the family is to have her return to her previous level of function.  She will need operative treatment of the right hip.  We  will try to get this done today.  Please hold blood thinners and keep her n.p.o.  06/26/2020, 7:09 AM

## 2020-06-27 DIAGNOSIS — N390 Urinary tract infection, site not specified: Secondary | ICD-10-CM | POA: Diagnosis not present

## 2020-06-27 DIAGNOSIS — D62 Acute posthemorrhagic anemia: Secondary | ICD-10-CM | POA: Diagnosis not present

## 2020-06-27 DIAGNOSIS — S72001A Fracture of unspecified part of neck of right femur, initial encounter for closed fracture: Secondary | ICD-10-CM | POA: Diagnosis not present

## 2020-06-27 DIAGNOSIS — E871 Hypo-osmolality and hyponatremia: Secondary | ICD-10-CM | POA: Diagnosis not present

## 2020-06-27 LAB — COMPREHENSIVE METABOLIC PANEL
ALT: 16 U/L (ref 0–44)
AST: 27 U/L (ref 15–41)
Albumin: 3.1 g/dL — ABNORMAL LOW (ref 3.5–5.0)
Alkaline Phosphatase: 42 U/L (ref 38–126)
Anion gap: 11 (ref 5–15)
BUN: 24 mg/dL — ABNORMAL HIGH (ref 8–23)
CO2: 22 mmol/L (ref 22–32)
Calcium: 8.4 mg/dL — ABNORMAL LOW (ref 8.9–10.3)
Chloride: 96 mmol/L — ABNORMAL LOW (ref 98–111)
Creatinine, Ser: 0.91 mg/dL (ref 0.44–1.00)
GFR, Estimated: >60 mL/min (ref >60)
Glucose, Bld: 132 mg/dL — ABNORMAL HIGH (ref 70–99)
Potassium: 4 mmol/L (ref 3.5–5.1)
Sodium: 129 mmol/L — ABNORMAL LOW (ref 135–145)
Total Bilirubin: 0.5 mg/dL (ref 0.3–1.2)
Total Protein: 5.5 g/dL — ABNORMAL LOW (ref 6.5–8.1)

## 2020-06-27 LAB — CBC WITH DIFFERENTIAL/PLATELET
Abs Immature Granulocytes: 0.26 10*3/uL — ABNORMAL HIGH (ref 0.00–0.07)
Basophils Absolute: 0 10*3/uL (ref 0.0–0.1)
Basophils Relative: 0 %
Eosinophils Absolute: 0 10*3/uL (ref 0.0–0.5)
Eosinophils Relative: 0 %
HCT: 26.7 % — ABNORMAL LOW (ref 36.0–46.0)
Hemoglobin: 9 g/dL — ABNORMAL LOW (ref 12.0–15.0)
Immature Granulocytes: 1 %
Lymphocytes Relative: 4 %
Lymphs Abs: 0.8 10*3/uL (ref 0.7–4.0)
MCH: 30.7 pg (ref 26.0–34.0)
MCHC: 33.7 g/dL (ref 30.0–36.0)
MCV: 91.1 fL (ref 80.0–100.0)
Monocytes Absolute: 3.2 10*3/uL — ABNORMAL HIGH (ref 0.1–1.0)
Monocytes Relative: 16 %
Neutro Abs: 16.1 10*3/uL — ABNORMAL HIGH (ref 1.7–7.7)
Neutrophils Relative %: 79 %
Platelets: 225 10*3/uL (ref 150–400)
RBC: 2.93 MIL/uL — ABNORMAL LOW (ref 3.87–5.11)
RDW: 13.3 % (ref 11.5–15.5)
WBC: 20.3 10*3/uL — ABNORMAL HIGH (ref 4.0–10.5)
nRBC: 0 % (ref 0.0–0.2)

## 2020-06-27 LAB — GLUCOSE, CAPILLARY
Glucose-Capillary: 120 mg/dL — ABNORMAL HIGH (ref 70–99)
Glucose-Capillary: 135 mg/dL — ABNORMAL HIGH (ref 70–99)
Glucose-Capillary: 136 mg/dL — ABNORMAL HIGH (ref 70–99)
Glucose-Capillary: 161 mg/dL — ABNORMAL HIGH (ref 70–99)

## 2020-06-27 LAB — MAGNESIUM: Magnesium: 2 mg/dL (ref 1.7–2.4)

## 2020-06-27 LAB — PHOSPHORUS: Phosphorus: 3.9 mg/dL (ref 2.5–4.6)

## 2020-06-27 NOTE — Plan of Care (Signed)

## 2020-06-27 NOTE — Evaluation (Signed)
Physical Therapy Evaluation Patient Details Name: Suzanne Hall MRN: [id number] DOB: Mar 22, 1933 Today's Date: 06/27/2020   History of Present Illness  The pt is an 86 yo female presenting with R hip fx after an unwitnessed fall, and is now s/p R THA. CT of head also revealed frontal hematoma. PMH includes: CKD, dementia, HLD, and osteopenia.    Clinical Impression  Pt in bed upon arrival of PT, agreeable to evaluation at this time. Prior to admission the pt was living at [facility] assisted living. The pt now presents with limitations in functional mobility, strength, power, ROM, activity tolerance, and stability due to above dx and resulting pain, and will continue to benefit from skilled PT to address these deficits. The pt was able to complete bed mobility, but required maxA of 1 to complete the transfer to sitting EOB, and required continued assist to scoot to EOB and maintain balance in sitting due to pain. The pt declined sit-stand transfers multiple times due to reports of pain and fatigue, required totalA to scoot laterally along EOB with use of bed pad. The pt will continue to benefit from skilled PT to facilitate improvement in functional strength, ROM, and capacity for scooting and sit-stand transfers.   The pt will also need continued intervention to address positioning as she prefers internal rotation of RLE for comfort despite frequent attempts to reposition and explain hip precautions. Discussed use of pillows and other equipment to maintain neutral position.      Follow Up Recommendations SNF;Supervision/Assistance - 24 hour    Equipment Recommendations       Recommendations for Other Services OT consult     Precautions / Restrictions Precautions Precautions: Posterior Hip;Fall Precaution Booklet Issued: No Precaution Comments: discussed verbally, pt with no recall in session Restrictions Weight Bearing Restrictions: Yes RLE Weight Bearing: Weight bearing as tolerated       Mobility  Bed Mobility Overal bed mobility: Needs Assistance Bed Mobility: Supine to Sit;Sit to Supine     Supine to sit: Max assist;HOB elevated Sit to supine: Total assist   General bed mobility comments: maxA to move BLE to EOB and maxA to raise trunk from [facility]. Pt then required assist to scoot to EOB. maxA to return to supine    Transfers Overall transfer level: Needs assistance Equipment used: None Transfers: Lateral/Scoot Transfers          Lateral/Scoot Transfers: Total assist General transfer comment: pt declined standing multiple times, required totalA to scoot laterally with use of bed pad to scoot along EOB  Ambulation/Gait             General Gait Details: pt unable      Balance Overall balance assessment: Needs assistance Sitting-balance support: Single extremity supported;Feet supported Sitting balance-Leahy Scale: Poor Sitting balance - Comments: UE suport and external assist                                     Pertinent Vitals/Pain Pain Assessment: Faces Faces Pain Scale: Hurts even more Pain Location: R hip with movement Pain Descriptors / Indicators: Grimacing;Guarding;Discomfort;Moaning Pain Intervention(s): Limited activity within patient's tolerance;Monitored during session;Repositioned    Home Living Family/patient expects to be discharged to:: Skilled nursing facility                 Additional Comments: pt unreliable historian. per chart, pt from assisted living    Prior Function Level of Independence:  Needs assistance         Comments: pt unreliable historian, limited info about prior mobility in chart. Pt from assisted living     Hand Dominance   Dominant Hand: Right    Extremity/Trunk Assessment   Upper Extremity Assessment Upper Extremity Assessment: Generalized weakness    Lower Extremity Assessment Lower Extremity Assessment: Generalized weakness;RLE deficits/detail RLE Deficits /  Details: limited by pain, pt able to move at ankle well. resisting all PROM to leg otherwise due to pain. pt resting with significant internal rotation, attempted to use pillow to support in neutral position RLE: Unable to fully assess due to pain RLE Sensation: WNL    Cervical / Trunk Assessment Cervical / Trunk Assessment: Kyphotic  Communication   Communication: No difficulties  Cognition Arousal/Alertness: Awake/alert Behavior During Therapy: WFL for tasks assessed/performed Overall Cognitive Status: History of cognitive impairments - at baseline                                 General Comments: pt with dementia at baseline, noted STM deficits through session as pt unable to remember any precautions despite repeated simple instructions. Pt seems to forget conversation after ~5 minutes, asking PT same questions multiple times through session      General Comments General comments (skin integrity, edema, etc.): VSS on RA, pt unable to demo comprehension of education regarding precautions    Exercises     Assessment/Plan    PT Assessment Patient needs continued PT services  PT Problem List Decreased strength;Decreased range of motion;Decreased activity tolerance;Decreased balance;Decreased mobility;Decreased coordination;Decreased cognition;Decreased safety awareness;Decreased knowledge of precautions;Pain       PT Treatment Interventions Gait training;DME instruction;Stair training;Functional mobility training;Therapeutic activities;Therapeutic exercise;Balance training;Patient/family education    PT Goals (Current goals can be found in the Care Plan section)  Acute Rehab PT Goals Patient Stated Goal: to go to bed PT Goal Formulation: With patient Time For Goal Achievement: 07/11/20 Potential to Achieve Goals: Good    Frequency Min 2X/week    AM-PAC PT "6 Clicks" Mobility  Outcome Measure Help needed turning from your back to your side while in a flat bed  without using bedrails?: A Lot Help needed moving from lying on your back to sitting on the side of a flat bed without using bedrails?: A Lot Help needed moving to and from a bed to a chair (including a wheelchair)?: Total Help needed standing up from a chair using your arms (e.g., wheelchair or bedside chair)?: Total Help needed to walk in hospital room?: Total Help needed climbing 3-5 steps with a railing? : Total 6 Click Score: 8    End of Session Equipment Utilized During Treatment: Gait belt Activity Tolerance: Patient limited by pain Patient left: in bed;with call bell/phone within reach;with bed alarm set;with restraints reapplied;with SCD's reapplied Nurse Communication: Mobility status (need for positioning to reduce internal rotation of RLE) PT Visit Diagnosis: Unsteadiness on feet (R26.81);Other abnormalities of gait and mobility (R26.89);Muscle weakness (generalized) (M62.81);Pain Pain - Right/Left: Right Pain - part of body: Hip    Time: 4536-4680 PT Time Calculation (min) (ACUTE ONLY): 26 min   Charges:   PT Evaluation $PT Eval Moderate Complexity: 1 Mod PT Treatments $Therapeutic Activity: 8-22 mins        Rolm Baptise, PT, DPT   Acute Rehabilitation Department Pager #: [phone]  Gaetana Michaelis 06/27/2020, 11:48 AM

## 2020-06-27 NOTE — Plan of Care (Signed)

## 2020-06-27 NOTE — Progress Notes (Signed)
SLP Cancellation Note  Patient Details Name: Suzanne Hall MRN: [id number] DOB: 05/11/1933   Cancelled treatment:       Reason Eval/Treat Not Completed: Fatigue/lethargy limiting ability to participate. Pt exhausted and sleeping soundly.  RN reports no obvious swallowing difficulty with breakfast.  SLP will continue efforts.   Lam Mccubbins L. Samson Frederic, MA CCC/SLP Acute Rehabilitation Services Office number [phone] Pager [phone]  Blenda Mounts Laurice 06/27/2020, 2:58 PM

## 2020-06-27 NOTE — Progress Notes (Signed)
PROGRESS NOTE    Suzanne Hall  ZOX:[id number] DOB: May 07, 1933 DOA: 06/25/2020 PCP: Patient, No Pcp Per   Brief Narrative:  HPI per Dr. Midge Minium on 06/25/20 Suzanne Hall is a 86 y.o. female with history of dementia was brought to the ER after patient was found to have unwitnessed fall and was found on the floor.  The exact circumstances of the fall is not clear.  After the fall patient was complaining of right hip pain.  ED Course: In the ER patient's x-ray showed right hip fracture and also CT head shows frontal scalp hematoma.  Labs are significant for sodium of 128 and no old labs to compare.  WBC count 11.8 Covid testing negative EKG shows sinus tachycardia with a first-degree AV block.  Dr. Victorino Dike on-call orthopedic surgeon has been consulted and plan is to have surgery in the morning.  **Interim History Patient underwent a right hip hemiarthroplasty utilizing the DePuy component By Dr. Charlann Boxer on 12/30/2. She came back to the floor and she is doing okay but does complain of significant hip pain.  Patient sundown and had to be restrained yesterday but friends have been removed.  Sodium is improving but WBC is slightly worse today.  Also her hemoglobin dropped 4 g since yesterday and could be a postsurgical drop with a high partial dilutional component as she is getting IV fluid hydration.  Will reduce IV fluids to 75 mL's per hour for now.  Continue to monitor for further signs and symptoms of bleeding.  TOC will be consulted given that PT recommending SNF.  Patient also appears to have a urinary tract infection however no urine cultures were obtained despite being ordered and being initiated on antibiotics.  Assessment & Plan:   Principal Problem:   Closed right hip fracture (HCC) Active Problems:   Hyponatremia   Dementia (HCC)   Hip fracture (HCC)  Right hip fracture unwitnessed fall.   -Orthopedic surgery was consulted and she kept n.p.o. after midnight for surgical  intervention -She underwent a right hip hemiarthroplasty today done by Dr. Horton Finer- -continue pain control with acetaminophen, fentanyl was given IV once, IV morphine, p.o. tramadol 50 to 100 mg every 6 hours for severe pain, methocarbamol and bowel regimen with bisacodyl 10 mg rectally as needed for moderate constipation and also is on MiraLAX 17 g p.o. twice daily -Head CT done and showed "Small right frontal scalp hematoma. No acute intracranial process." -VTE prophylaxis per orthopedic surgery and they are recommending aspirin 81 mg p.o. twice daily -Antiemetics with ondansetron and metoclopramide if ineffective -we will need PT OT evaluation for further disposition planning and they are recommending SNF -Further care per orthopedic surgery and they are recommending advancing her diet, up with therapy and weightbearing as tolerated  Hyponatremia  -Cause not clear.  Will trend metabolic panel check urine studies including urine sodium osmolality check TSH which was 3.38 and cortisol level was 29.2.  -Sodium level is slowly improving and went from 126 -> 129 and will repeat again tomorrow -WasCurrently getting NS at 100 mL/hr and will reduce to 75 mL/hr -Repeat CMP in the AM   Leukocytosis in the setting of Fall and UTI -In the setting of her fall and likely urinary tract infection -Patient WBC was 11.8 -> 14.9 and now has worsened to 20.3 -Urinalysis done and showed a hazy appearance with moderate hemoglobin, large leukocytes, positive nitrites, many bacteria, 0-5 squamous epithelial cells, greater than 50 WBCs  -Check urine culture unfortunately  she received preoperative antibiotics with cefazolin and unsure whether her urine culture will be of any value now -Urine Cx Sent today despite it being ordered yesterday  -Empirically start her on IV ceftriaxone but unfortunately urine culture was not obtained until today   Elevated blood pressure readings  -we will keep patient on as needed IV  hydralazine.  Follow blood pressure trends. -Last blood pressure reading was 1 -Continue to monitor blood pressures per protocol  Acute Blood Loss Anemia -Patient's Hgb/Hct went from 12.6/36.9 -> 13.0/39.6 -> 9.0/26.7 -Check Anemia Panel in the AM -Could be partly dilutional drop but now likely post-surgical drop -Continue to Monitor for S/Sx of bleeding; Currently no overt bleeding noted -Repeat CBC in the AM   History of Dementia. -Placed on delirium precautions -Patient Sundowns at night  Scalp hematoma  -seen in the CAT scan.  Hyperglycemia  -Patient's blood sugar ranging from 115-132 on daily BMPs  -CBG's ranging from 120-141 -Check hemoglobin A1c in a.m. -Continue to monitor blood sugars carefully and if necessary will place on sensitive NovoLog sign scale insulin AC  DVT prophylaxis: SCDs, Aspirin 81 mg p.o. twice daily Code Status: FULL CODE Family Communication: No family present at bedside Disposition Plan: Pending Ortho clearance and evaluation by PT OT; PT Recommending SNF so will get Social worker to assist with placement   Status is: Inpatient  Remains inpatient appropriate because:Unsafe d/c plan, IV treatments appropriate due to intensity of illness or inability to take PO and Inpatient level of care appropriate due to severity of illness   Dispo: The patient is from: Home              Anticipated d/c is to: TBD              Anticipated d/c date is: 2 days              Patient currently is not medically stable to d/c.  Consultants:   Orthopedic Surgery   Procedures:  PROCEDURE:  right hip hemiarthroplasty utilizing DePuy component, size 5 standard Actis stem with a 44 unipolar ball with a +5 adapter  Antimicrobials:  Anti-infectives (From admission, onward)   Start     Dose/Rate Route Frequency Ordered Stop   06/26/20 1900  cefTRIAXone (ROCEPHIN) 1 g in sodium chloride 0.9 % 100 mL IVPB        1 g 200 mL/hr over 30 Minutes Intravenous Every 24  hours 06/26/20 1809     06/26/20 1430  ceFAZolin (ANCEF) IVPB 2g/100 mL premix  Status:  Discontinued        2 g 200 mL/hr over 30 Minutes Intravenous Every 6 hours 06/26/20 1134 06/26/20 1811   06/26/20 0845  ceFAZolin (ANCEF) IVPB 2g/100 mL premix        2 g 200 mL/hr over 30 Minutes Intravenous On call to O.R. 06/26/20 0756 06/26/20 0916       Subjective: Seen and examined this a.m. and she was calmer and her waist restraint has been removed.  No nausea or vomiting.  Patient's son told the nurse today that she sundown's around 3:57 PM.  Chest pain, lightheadedness or dizziness.  Patient appeared calm and comfortable and denied any complaints or pain right now.  Objective: Vitals:   06/26/20 2106 06/27/20 0400 06/27/20 0630 06/27/20 0754  BP: (!) 126/49  138/73 129/65  Pulse: 94 96 (!) 102 99  Resp: Temp: 98.5 F (36.9 C)  98.2 F (36.8 C) 98.4  F (36.9 C)  TempSrc: Oral  Oral Oral  SpO2: 97% 93% 98% 99%  Weight:      Height:        Intake/Output Summary (Last 24 hours) at 06/27/2020 1300 Last data filed at 06/27/2020 0400 Gross per 24 hour  Intake 580 ml  Output -  Net 580 ml   Filed Weights   06/25/20 1940 06/26/20 0819  Weight: 51.3 kg 51.3 kg   Examination: Physical Exam:  Constitutional: Patient is a thin elderly Caucasian female who is resting and in no acute distress Eyes: Lids and conjunctivae normal, sclerae anicteric  ENMT: External Ears, Nose appear normal. Grossly normal hearing.  Neck: Appears normal, supple, no cervical masses, normal ROM, no appreciable thyromegaly; no JVD Respiratory: Diminished to auscultation bilaterally, no wheezing, rales, rhonchi or crackles.  Cardiovascular: RRR, no murmurs / rubs / gallops. S1 and S2 auscultated.  Abdomen: Soft, non-tender, non-distended. Bowel sounds positive.  GU: Deferred. Musculoskeletal: No clubbing / cyanosis of digits/nails. No joint deformity upper and lower extremities.  Skin: No rashes,  lesions, ulcers, on a limited skin evaluation. No induration; Warm and dry.  Neurologic: CN 2-12 grossly intact with no focal deficits. Romberg sign and cerebellar reflexes not assessed.  Psychiatric: Impaired judgment and insight. Awake and Alert but not fully oriented x 3. Normal mood and appropriate affect.    Data Reviewed: I have personally reviewed following labs and imaging studies  CBC: Recent Labs  Lab 06/25/20 1948 06/26/20 0413 06/27/20 0316  WBC 11.8* 14.9* 20.3*  NEUTROABS 9.6*  --  16.1*  HGB 12.6 13.0 9.0*  HCT 36.9 39.6 26.7*  MCV 90.0 89.6 91.1  PLT 283 267 225   Basic Metabolic Panel: Recent Labs  Lab 06/25/20 1948 06/26/20 0413 06/26/20 0647 06/27/20 0316  NA 128* 126* 126* 129*  K 4.2 3.8 3.8 4.0  CL 93* 93* 93* 96*  CO2 23 22 24 22   GLUCOSE 115* 124* 132* 132*  BUN 24* 20 18 24*  CREATININE 0.83 0.77 0.77 0.91  CALCIUM 9.4 9.1 9.3 8.4*  MG  --   --   --  2.0  PHOS  --   --   --  3.9   GFR: Estimated Creatinine Clearance: 36.6 mL/min (by C-G formula based on SCr of 0.91 mg/dL). Liver Function Tests: Recent Labs  Lab 06/27/20 0316  AST 27  ALT 16  ALKPHOS 42  BILITOT 0.5  PROT 5.5*  ALBUMIN 3.1*   No results for input(s): LIPASE, AMYLASE in the last 168 hours. No results for input(s): AMMONIA in the last 168 hours. Coagulation Profile: No results for input(s): INR, PROTIME in the last 168 hours. Cardiac Enzymes: No results for input(s): CKTOTAL, CKMB, CKMBINDEX, TROPONINI in the last 168 hours. BNP (last 3 results) No results for input(s): PROBNP in the last 8760 hours. HbA1C: No results for input(s): HGBA1C in the last 72 hours. CBG: Recent Labs  Lab 06/26/20 1242 06/26/20 1838 06/27/20 0033 06/27/20 0648 06/27/20 1247  GLUCAP 132* 138* 136* 120* 135*   Lipid Profile: No results for input(s): CHOL, HDL, LDLCALC, TRIG, CHOLHDL, LDLDIRECT in the last 72 hours. Thyroid Function Tests: Recent Labs    06/26/20 0647  TSH 3.386    Anemia Panel: No results for input(s): VITAMINB12, FOLATE, FERRITIN, TIBC, IRON, RETICCTPCT in the last 72 hours. Sepsis Labs: No results for input(s): PROCALCITON, LATICACIDVEN in the last 168 hours.  Recent Results (from the past 240 hour(s))  Resp Panel by RT-PCR (Flu  A&B, Covid) Nasopharyngeal Swab     Status: None   Collection Time: 06/25/20  8:18 PM   Specimen: Nasopharyngeal Swab; Nasopharyngeal(NP) swabs in vial transport medium  Result Value Ref Range Status   SARS Coronavirus 2 by RT PCR NEGATIVE NEGATIVE Final    Comment: (NOTE) SARS-CoV-2 target nucleic acids are NOT DETECTED.  The SARS-CoV-2 RNA is generally detectable in upper respiratory specimens during the acute phase of infection. The lowest concentration of SARS-CoV-2 viral copies this assay can detect is 138 copies/mL. A negative result does not preclude SARS-Cov-2 infection and should not be used as the sole basis for treatment or other patient management decisions. A negative result may occur with  improper specimen collection/handling, submission of specimen other than nasopharyngeal swab, presence of viral mutation(s) within the areas targeted by this assay, and inadequate number of viral copies(<138 copies/mL). A negative result must be combined with clinical observations, patient history, and epidemiological information. The expected result is Negative.  Fact Sheet for Patients:  BloggerCourse.com  Fact Sheet for Healthcare Providers:  SeriousBroker.it  This test is no t yet approved or cleared by the [place] FDA and  has been authorized for detection and/or diagnosis of SARS-CoV-2 by FDA under an Emergency Use Authorization (EUA). This EUA will remain  in effect (meaning this test can be used) for the duration of the COVID-19 declaration under Section 564(b)(1) of the Act, 21 U.S.C.section 360bbb-3(b)(1), unless the authorization is terminated   or revoked sooner.       Influenza A by PCR NEGATIVE NEGATIVE Final   Influenza B by PCR NEGATIVE NEGATIVE Final    Comment: (NOTE) The Xpert Xpress SARS-CoV-2/FLU/RSV plus assay is intended as an aid in the diagnosis of influenza from Nasopharyngeal swab specimens and should not be used as a sole basis for treatment. Nasal washings and aspirates are unacceptable for Xpert Xpress SARS-CoV-2/FLU/RSV testing.  Fact Sheet for Patients: BloggerCourse.com  Fact Sheet for Healthcare Providers: SeriousBroker.it  This test is not yet approved or cleared by the [place] FDA and has been authorized for detection and/or diagnosis of SARS-CoV-2 by FDA under an Emergency Use Authorization (EUA). This EUA will remain in effect (meaning this test can be used) for the duration of the COVID-19 declaration under Section 564(b)(1) of the Act, 21 U.S.C. section 360bbb-3(b)(1), unless the authorization is terminated or revoked.  Performed at [facility], [address] [place], [place] [address]   Surgical pcr screen     Status: None   Collection Time: 06/26/20  8:01 AM   Specimen: Nasal Mucosa; Nasal Swab  Result Value Ref Range Status   MRSA, PCR NEGATIVE NEGATIVE Final   Staphylococcus aureus NEGATIVE NEGATIVE Final    Comment: (NOTE) The Xpert SA Assay (FDA approved for NASAL specimens in patients 51 years of age and older), is one component of a comprehensive surveillance program. It is not intended to diagnose infection nor to guide or monitor treatment. Performed at [facility], [address], [place], [place] [address]      RN Pressure Injury Documentation:     Estimated body mass index is 18.8 kg/m as calculated from the following:   Height as of this encounter: 5\' 5"  (1.651 m).   Weight as of this encounter: 51.3 kg.  Malnutrition Type: Nutrition Problem: Increased nutrient needs Etiology: hip  fracture,post-op healing Malnutrition Characteristics: Signs/Symptoms: estimated needs Nutrition Interventions: Interventions: Ensure Enlive (each supplement provides 350kcal and 20 grams of protein),MVI  Radiology Studies: DG Chest 1 View  Result Date: 06/25/2020 CLINICAL DATA:  Right hip fracture. EXAM: CHEST  1 VIEW COMPARISON:  None. FINDINGS: The heart size and mediastinal contours are within normal limits. Both lungs are clear. The visualized skeletal structures are unremarkable. IMPRESSION: No active disease. Electronically Signed   By: Lupita Raider M.D.   On: 06/25/2020 20:15   CT Head Wo Contrast  Result Date: 06/25/2020 CLINICAL DATA:  Un witnessed fall, head trauma EXAM: CT HEAD WITHOUT CONTRAST TECHNIQUE: Contiguous axial images were obtained from the base of the skull through the vertex without intravenous contrast. COMPARISON:  None. FINDINGS: Brain: Encephalomalacia within the anterior aspect left temporal lobe may reflect previous infarct or trauma. No acute infarct or hemorrhage. Lateral ventricles and midline structures are unremarkable. No acute extra-axial fluid collections. No mass effect. Vascular: No hyperdense vessel or unexpected calcification. Skull: Minimal right frontal scalp edema. Negative for fracture or focal lesion. Sinuses/Orbits: No acute finding. Other: None. IMPRESSION: 1. Small right frontal scalp hematoma. 2. No acute intracranial process. Electronically Signed   By: Sharlet Salina M.D.   On: 06/25/2020 21:08   CT Cervical Spine Wo Contrast  Result Date: 06/25/2020 CLINICAL DATA:  Un witnessed fall EXAM: CT CERVICAL SPINE WITHOUT CONTRAST TECHNIQUE: Multidetector CT imaging of the cervical spine was performed without intravenous contrast. Multiplanar CT image reconstructions were also generated. COMPARISON:  None. FINDINGS: Alignment: There is mild retrolisthesis of C5 on C6. Otherwise alignment is anatomic. Skull base and vertebrae: No acute fracture.  No primary bone lesion or focal pathologic process. Soft tissues and spinal canal: No prevertebral fluid or swelling. No visible canal hematoma. Disc levels: There is mild spondylosis at C3-4, resulting in mild left neural foraminal encroachment. There is moderate spondylosis at C5-6, resulting in symmetrical neural foraminal encroachment. Mild spondylosis at C6-7 results in left-sided neural foraminal encroachment. There is diffuse facet hypertrophy throughout the cervical spine. Upper chest: Airway is patent.  Lung apices are clear. Other: Reconstructed images demonstrate no additional findings. IMPRESSION: 1. Multilevel cervical spondylosis and facet hypertrophy. 2. No acute cervical spine fracture. Electronically Signed   By: Sharlet Salina M.D.   On: 06/25/2020 21:11   DG Pelvis Portable  Result Date: 06/26/2020 CLINICAL DATA:  Status post RIGHT hip replacement EXAM: PORTABLE PELVIS 1-2 VIEWS COMPARISON:  Hip evaluation from June 25, 2020 FINDINGS: AP view of the pelvis with interval placement of a RIGHT hip hemiarthroplasty construct without complicating features. Femoral component appears well seated on AP view. Acetabular component within the acetabulum without signs of fracture on AP view. Osteopenia. Mild degenerative changes about the LEFT hip. Soft tissues with expected postoperative changes about the RIGHT hip. IMPRESSION: RIGHT hip hemiarthroplasty placement without complicating features. Electronically Signed   By: Donzetta Kohut M.D.   On: 06/26/2020 10:41   DG Hip Unilat W or Wo Pelvis 2-3 Views Right  Result Date: 06/25/2020 CLINICAL DATA:  Right hip pain after fall. EXAM: DG HIP (WITH OR WITHOUT PELVIS) 2-3V RIGHT COMPARISON:  None. FINDINGS: Severely displaced proximal right femoral neck fracture is noted. Left hip is unremarkable. IMPRESSION: Severely displaced proximal right femoral neck fracture. Electronically Signed   By: Lupita Raider M.D.   On: 06/25/2020 20:15    Scheduled Meds: . aspirin  81 mg Oral BID  . docusate sodium  100 mg Oral BID  . feeding supplement  237 mL Oral TID BM  . ferrous sulfate  325 mg Oral TID PC  .  multivitamin with minerals  1 tablet Oral Daily  . polyethylene glycol  17 g Oral BID   Continuous Infusions: . sodium chloride 75 mL/hr at 06/27/20 0945  . cefTRIAXone (ROCEPHIN)  IV 1 g (06/26/20 2222)  . lactated ringers 10 mL/hr at 06/26/20 0835  . methocarbamol (ROBAXIN) IV      LOS: 2 days   Merlene Laughtermair Latif Clarabelle Oscarson, DO [facility] PAGER is on AMION  If 7PM-7AM, please contact night-coverage [email or web address]

## 2020-06-27 NOTE — Progress Notes (Signed)
Subjective: 1 Day Post-Op Procedure(s) (LRB): ARTHROPLASTY BIPOLAR HIP (HEMIARTHROPLASTY) (Right) Patient reports pain as mild.  She is resting comfortably. No complaints.   Objective: Vital signs in last 24 hours: Temp:  [97.3 F (36.3 C)-98.5 F (36.9 C)] 98.4 F (36.9 C) (12/31 0754) Pulse Rate:  [71-102] 99 (12/31 0754) Resp:  [14-22] 17 (12/31 0754) BP: (90-158)/(6-92) 129/65 (12/31 0754) SpO2:  [93 %-100 %] 99 % (12/31 0754)  Intake/Output from previous day: 12/30 0701 - 12/31 0700 In: 1530 [P.O.:480; I.V.:500; IV Piggyback:550] Out: 500 [Urine:150; Blood:350] Intake/Output this shift: No intake/output data recorded.  Recent Labs    06/25/20 1948 06/26/20 0413 06/27/20 0316  HGB 12.6 13.0 9.0*   Recent Labs    06/26/20 0413 06/27/20 0316  WBC 14.9* 20.3*  RBC 4.42 2.93*  HCT 39.6 26.7*  PLT 267 225   Recent Labs    06/26/20 0647 06/27/20 0316  NA 126* 129*  K 3.8 4.0  CL 93* 96*  CO2 24 22  BUN 18 24*  CREATININE 0.77 0.91  GLUCOSE 132* 132*  CALCIUM 9.3 8.4*   No results for input(s): LABPT, INR in the last 72 hours.  Alert to Person, dementia at baseline ABD soft Neurovascular intact Sensation intact distally Intact pulses distally Dorsiflexion/Plantar flexion intact Incision: dressing C/D/I and no drainage No cellulitis present Compartment soft Calves non-tender   Assessment/Plan: 1 Day Post-Op Procedure(s) (LRB): ARTHROPLASTY BIPOLAR HIP (HEMIARTHROPLASTY) (Right) Advance diet Up with therapy Suzanne Hall 06/27/2020, 8:51 AM

## 2020-06-28 ENCOUNTER — Encounter (HOSPITAL_COMMUNITY): Payer: Self-pay | Admitting: Internal Medicine

## 2020-06-28 DIAGNOSIS — E871 Hypo-osmolality and hyponatremia: Secondary | ICD-10-CM | POA: Diagnosis not present

## 2020-06-28 DIAGNOSIS — D62 Acute posthemorrhagic anemia: Secondary | ICD-10-CM | POA: Diagnosis not present

## 2020-06-28 DIAGNOSIS — S72001A Fracture of unspecified part of neck of right femur, initial encounter for closed fracture: Secondary | ICD-10-CM | POA: Diagnosis not present

## 2020-06-28 DIAGNOSIS — N390 Urinary tract infection, site not specified: Secondary | ICD-10-CM | POA: Diagnosis not present

## 2020-06-28 LAB — CBC WITH DIFFERENTIAL/PLATELET
Abs Immature Granulocytes: 0.08 10*3/uL — ABNORMAL HIGH (ref 0.00–0.07)
Abs Immature Granulocytes: 0.1 10*3/uL — ABNORMAL HIGH (ref 0.00–0.07)
Basophils Absolute: 0 10*3/uL (ref 0.0–0.1)
Basophils Absolute: 0 10*3/uL (ref 0.0–0.1)
Basophils Relative: 0 %
Basophils Relative: 0 %
Eosinophils Absolute: 0 10*3/uL (ref 0.0–0.5)
Eosinophils Absolute: 0 10*3/uL (ref 0.0–0.5)
Eosinophils Relative: 0 %
Eosinophils Relative: 0 %
HCT: 22 % — ABNORMAL LOW (ref 36.0–46.0)
HCT: 24 % — ABNORMAL LOW (ref 36.0–46.0)
Hemoglobin: 7.3 g/dL — ABNORMAL LOW (ref 12.0–15.0)
Hemoglobin: 7.9 g/dL — ABNORMAL LOW (ref 12.0–15.0)
Immature Granulocytes: 1 %
Immature Granulocytes: 1 %
Lymphocytes Relative: 4 %
Lymphocytes Relative: 6 %
Lymphs Abs: 0.6 10*3/uL — ABNORMAL LOW (ref 0.7–4.0)
Lymphs Abs: 0.9 10*3/uL (ref 0.7–4.0)
MCH: 30.4 pg (ref 26.0–34.0)
MCH: 30.3 pg (ref 26.0–34.0)
MCHC: 33.2 g/dL (ref 30.0–36.0)
MCHC: 32.9 g/dL (ref 30.0–36.0)
MCV: 91.7 fL (ref 80.0–100.0)
MCV: 92 fL (ref 80.0–100.0)
Monocytes Absolute: 1.9 10*3/uL — ABNORMAL HIGH (ref 0.1–1.0)
Monocytes Absolute: 1.6 10*3/uL — ABNORMAL HIGH (ref 0.1–1.0)
Monocytes Relative: 14 %
Monocytes Relative: 10 %
Neutro Abs: 10.8 10*3/uL — ABNORMAL HIGH (ref 1.7–7.7)
Neutro Abs: 13.6 10*3/uL — ABNORMAL HIGH (ref 1.7–7.7)
Neutrophils Relative %: 81 %
Neutrophils Relative %: 83 %
Platelets: 191 10*3/uL (ref 150–400)
Platelets: 249 10*3/uL (ref 150–400)
RBC: 2.4 MIL/uL — ABNORMAL LOW (ref 3.87–5.11)
RBC: 2.61 MIL/uL — ABNORMAL LOW (ref 3.87–5.11)
RDW: 13.6 % (ref 11.5–15.5)
RDW: 13.8 % (ref 11.5–15.5)
WBC: 13.4 10*3/uL — ABNORMAL HIGH (ref 4.0–10.5)
WBC: 16.2 10*3/uL — ABNORMAL HIGH (ref 4.0–10.5)
nRBC: 0 % (ref 0.0–0.2)
nRBC: 0 % (ref 0.0–0.2)

## 2020-06-28 LAB — FERRITIN: Ferritin: 134 ng/mL (ref 11–307)

## 2020-06-28 LAB — COMPREHENSIVE METABOLIC PANEL
ALT: 12 U/L (ref 0–44)
AST: 23 U/L (ref 15–41)
Albumin: 2.6 g/dL — ABNORMAL LOW (ref 3.5–5.0)
Alkaline Phosphatase: 35 U/L — ABNORMAL LOW (ref 38–126)
Anion gap: 6 (ref 5–15)
BUN: 34 mg/dL — ABNORMAL HIGH (ref 8–23)
CO2: 22 mmol/L (ref 22–32)
Calcium: 8.2 mg/dL — ABNORMAL LOW (ref 8.9–10.3)
Chloride: 103 mmol/L (ref 98–111)
Creatinine, Ser: 0.89 mg/dL (ref 0.44–1.00)
GFR, Estimated: >60 mL/min (ref >60)
Glucose, Bld: 116 mg/dL — ABNORMAL HIGH (ref 70–99)
Potassium: 4.4 mmol/L (ref 3.5–5.1)
Sodium: 131 mmol/L — ABNORMAL LOW (ref 135–145)
Total Bilirubin: 0.2 mg/dL — ABNORMAL LOW (ref 0.3–1.2)
Total Protein: 5 g/dL — ABNORMAL LOW (ref 6.5–8.1)

## 2020-06-28 LAB — RETICULOCYTES
Immature Retic Fract: 6 % (ref 2.3–15.9)
RBC.: 2.32 MIL/uL — ABNORMAL LOW (ref 3.87–5.11)
Retic Count, Absolute: 37.4 10*3/uL (ref 19.0–186.0)
Retic Ct Pct: 1.6 % (ref 0.4–3.1)

## 2020-06-28 LAB — FOLATE: Folate: 8.8 ng/mL (ref >5.9)

## 2020-06-28 LAB — HEMOGLOBIN A1C
Hgb A1c MFr Bld: 5.4 % (ref 4.8–5.6)
Mean Plasma Glucose: 108.28 mg/dL

## 2020-06-28 LAB — IRON AND TIBC
Iron: 12 ug/dL — ABNORMAL LOW (ref 28–170)
Saturation Ratios: 5 % — ABNORMAL LOW (ref 10.4–31.8)
TIBC: 235 ug/dL — ABNORMAL LOW (ref 250–450)
UIBC: 223 ug/dL

## 2020-06-28 LAB — VITAMIN B12: Vitamin B-12: 257 pg/mL (ref 180–914)

## 2020-06-28 LAB — URINE CULTURE: Culture: NO GROWTH

## 2020-06-28 LAB — PHOSPHORUS: Phosphorus: 2.5 mg/dL (ref 2.5–4.6)

## 2020-06-28 LAB — MAGNESIUM: Magnesium: 2.3 mg/dL (ref 1.7–2.4)

## 2020-06-28 MED ORDER — POLYSACCHARIDE IRON COMPLEX 150 MG PO CAPS: 150.0000 mg | ORAL_CAPSULE | Freq: Every day | ORAL | Status: DC

## 2020-06-28 NOTE — Progress Notes (Signed)
OT Cancellation Note  Patient Details Name: Suzanne Hall MRN: [id number] DOB: 1932/12/26   Cancelled Treatment:    Reason Eval/Treat Not Completed: Patient declined, no reason specified (Pt stating "goodbye, get out of my room. My legs are not crossed." OTR describing need for OT eval and for bed positioning to be fixed, but pt refused and continued to say "get out.")   OT to continue to follow for OT eval. Pt unaware of hip precautions and unwilling to participate to ensure no leg crossing with a pillow inbetween. Made RN aware.   Flora Lipps, OTR/L Acute Rehabilitation Services Pager: [phone] Office: [phone]   Flora Lipps 06/28/2020, 11:32 AM

## 2020-06-28 NOTE — Progress Notes (Signed)
PROGRESS NOTE    Suzanne Hall  DTO:[id number] DOB: October 04, 1932 DOA: 06/25/2020 PCP: Patient, No Pcp Per   Brief Narrative:  HPI per Dr. Midge Minium on 06/25/20 Suzanne Hall is a 87 y.o. female with history of dementia was brought to the ER after patient was found to have unwitnessed fall and was found on the floor.  The exact circumstances of the fall is not clear.  After the fall patient was complaining of right hip pain.  ED Course: In the ER patient's x-ray showed right hip fracture and also CT head shows frontal scalp hematoma.  Labs are significant for sodium of 128 and no old labs to compare.  WBC count 11.8 Covid testing negative EKG shows sinus tachycardia with a first-degree AV block.  Dr. Victorino Dike on-call orthopedic surgeon has been consulted and plan is to have surgery in the morning.  **Interim History Patient underwent a right hip hemiarthroplasty utilizing the DePuy component By Dr. Charlann Boxer on 12/30/2. She came back to the floor and she is doing okay but does complain of significant hip pain.  Patient sundown and had to be restrained yesterday but friends have been removed.  Sodium is improving but WBC is slightly worse today.  Also her hemoglobin dropped 4 g since yesterday and could be a postsurgical drop with a high partial dilutional component as she is getting IV fluid hydration.  Will reduce IV fluids to 75 mL's per hour for now.  Continue to monitor for further signs and symptoms of bleeding.  TOC will be consulted given that PT recommending SNF.  Patient also appears to have a urinary tract infection however no urine cultures were obtained despite being ordered and being initiated on antibiotics.  Urine cultures finally resulted and of no surprise it showed no growth as she had already been placed on antibiotics.  Assessment & Plan:   Principal Problem:   Closed right hip fracture (HCC) Active Problems:   Hyponatremia   Dementia (HCC)   Hip fracture (HCC)  Right  hip fracture unwitnessed fall.   -Orthopedic surgery was consulted and she kept n.p.o. after midnight for surgical intervention -She underwent a right hip hemiarthroplasty today done by Dr. Horton Finer- -continue pain control with acetaminophen, fentanyl was given IV once, IV morphine, p.o. tramadol 50 to 100 mg every 6 hours for severe pain, methocarbamol and bowel regimen with bisacodyl 10 mg rectally as needed for moderate constipation and also is on MiraLAX 17 g p.o. twice daily -Head CT done and showed "Small right frontal scalp hematoma. No acute intracranial process." -VTE prophylaxis per orthopedic surgery and they are recommending aspirin 81 mg p.o. twice daily -Antiemetics with ondansetron and metoclopramide if ineffective -we will need PT OT evaluation for further disposition planning and they are recommending SNF -Further care per orthopedic surgery and they are recommending advancing her diet, up with therapy and weightbearing as tolerated  Hyponatremia, slowly improving  -Cause not clear.  Will trend metabolic panel check urine studies including urine sodium osmolality check TSH which was 3.38 and cortisol level was 29.2.  -Sodium level is slowly improving and went from 126 -> 129 -> 131  -Was Currently getting NS at 100 mL/hr and will reduce to 75 mL/hr -Repeat CMP in the AM   Leukocytosis in the setting of Fall and UTI -In the setting of her fall and likely urinary tract infection -Patient WBC was 11.8 -> 14.9 and now has worsened to 20.3 and is now 13.4 -> 16.2 -Urinalysis  done and showed a hazy appearance with moderate hemoglobin, large leukocytes, positive nitrites, many bacteria, 0-5 squamous epithelial cells, greater than 50 WBCs  -Check urine culture unfortunately she received preoperative antibiotics with cefazolin and unsure whether her urine culture will be of any value now -Urine Cx Sent and showed no Growth -Empirically start her on IV ceftriaxone but unfortunately urine  culture was not obtained until today   Elevated blood pressure readings  -we will keep patient on as needed IV hydralazine.  Follow blood pressure trends. -Last blood pressure reading was not done as patient refused Vital Signs -Continue to monitor blood pressures per protocol  Acute Blood Loss Anemia -Patient's Hgb/Hct went from 12.6/36.9 -> 13.0/39.6 -> 9.0/26.7 -> 7.3/22.0 and is now 7.9/24.0 -Check Anemia Panel and showed an iron level of 12, U IBC 223, TIBC of 235, saturation ratios of 5%, ferritin level of 134, folate level 8.8, vitamin B12 257 -Could be partly dilutional drop but now likely post-surgical drop -We will obtain FOBT given her drop in hemoglobin and if continues to drop further will transfuse PRBCs and obtain a CT of the abdomen pelvis rule out retroperitoneal hematoma -Continue with ferrous sulfate 325 mg p.o. 3 times daily with meals -Continue to Monitor for S/Sx of bleeding; Currently no overt bleeding noted -Repeat CBC in the AM   History of Dementia. -Placed on delirium precautions; was agitated this morning -Patient Sundowns at night  Scalp hematoma  -seen in the CAT scan.  Hyperglycemia  -Patient's blood sugar ranging from 115-132 on daily BMPs  -CBG's ranging from 120-161 -Check hemoglobin A1c in a.m. -Continue to monitor blood sugars carefully and if necessary will place on sensitive NovoLog sign scale insulin AC  DVT prophylaxis: SCDs, Aspirin 81 mg p.o. twice daily Code Status: FULL CODE Family Communication: No family present at bedside Disposition Plan: Pending Ortho clearance and evaluation by PT OT; PT Recommending SNF so will get Social worker to assist with placement   Status is: Inpatient  Remains inpatient appropriate because:Unsafe d/c plan, IV treatments appropriate due to intensity of illness or inability to take PO and Inpatient level of care appropriate due to severity of illness   Dispo: The patient is from: Home               Anticipated d/c is to: TBD              Anticipated d/c date is: 2 days              Patient currently is not medically stable to d/c.  Consultants:   Orthopedic Surgery   Procedures:  PROCEDURE:  right hip hemiarthroplasty utilizing DePuy component, size 5 standard Actis stem with a 44 unipolar ball with a +5 adapter  Antimicrobials:  Anti-infectives (From admission, onward)   Start     Dose/Rate Route Frequency Ordered Stop   06/26/20 1900  cefTRIAXone (ROCEPHIN) 1 g in sodium chloride 0.9 % 100 mL IVPB        1 g 200 mL/hr over 30 Minutes Intravenous Every 24 hours 06/26/20 1809     06/26/20 1430  ceFAZolin (ANCEF) IVPB 2g/100 mL premix  Status:  Discontinued        2 g 200 mL/hr over 30 Minutes Intravenous Every 6 hours 06/26/20 1134 06/26/20 1811   06/26/20 0845  ceFAZolin (ANCEF) IVPB 2g/100 mL premix        2 g 200 mL/hr over 30 Minutes Intravenous On call to O.R. 06/26/20 5465 06/26/20  5009       Subjective: Seen and examined this a.m. and she was agitated.  Blood count has dropped and when I spoke to her she did not want me to speak with her and she wanted to be left alone.  I asked to examine her politely and she states "no I do not want to be examined, please leave."  Repeat CBC showed blood count has improved from this morning we will need to continue to monitor for signs and symptoms of bleeding as she has had quite a significant drop since her surgery.  Objective: Vitals:   06/27/20 0754 06/27/20 1516 06/27/20 2017 06/28/20 0407  BP: 129/65 116/65 (!) 97/47 (!) 116/58  Pulse: 99 99 69 78  Resp: 17 14 16 18   Temp: 98.4 F (36.9 C) (!) 97.4 F (36.3 C) 97.7 F (36.5 C) 97.8 F (36.6 C)  TempSrc: Oral Oral Oral Oral  SpO2: 99% 98% 97% 100%  Weight:      Height:        Intake/Output Summary (Last 24 hours) at 06/28/2020 1433 Last data filed at 06/27/2020 1752 Gross per 24 hour  Intake 518.55 ml  Output --  Net 518.55 ml   Filed Weights   06/25/20 1940  06/26/20 0819  Weight: 51.3 kg 51.3 kg   Examination is Limited as Patient would not allow me to Examine her Thoroughly Physical Exam:  Constitutional: WN/WD elderly demented Caucasian female who is agitated but appears comfortable Eyes: Conjunctivae normal, sclerae anicteric  ENMT: External Ears, Nose appear normal. Grossly normal hearing. Neck: Appears normal, supple, no cervical masses or JVD noted Respiratory: Has equal chest rise on both sides with unlabored breathing. Cardiovascular: Would not let me listen to heart Abdomen: Would not let me examine GU: Deferred. Musculoskeletal: No appreciable joint deformities on limited skin evaluation Skin: No rashes, lesions, ulcers on limited skin evaluation.  Neurologic: She is agitated but cranial nerves II through XII grossly intact with no appreciable focal deficits Psychiatric: Impaired judgment and insight.  She is agitated and she is awake.   Data Reviewed: I have personally reviewed following labs and imaging studies  CBC: Recent Labs  Lab 06/25/20 1948 06/26/20 0413 06/27/20 0316 06/28/20 0317 06/28/20 0928  WBC 11.8* 14.9* 20.3* 13.4* 16.2*  NEUTROABS 9.6*  --  16.1* 10.8* 13.6*  HGB 12.6 13.0 9.0* 7.3* 7.9*  HCT 36.9 39.6 26.7* 22.0* 24.0*  MCV 90.0 89.6 91.1 91.7 92.0  PLT 283 267 225 191 249   Basic Metabolic Panel: Recent Labs  Lab 06/25/20 1948 06/26/20 0413 06/26/20 0647 06/27/20 0316 06/28/20 0317  NA 128* 126* 126* 129* 131*  K 4.2 3.8 3.8 4.0 4.4  CL 93* 93* 93* 96* 103  CO2 23 22 24 22 22   GLUCOSE 115* 124* 132* 132* 116*  BUN 24* 20 18 24* 34*  CREATININE 0.83 0.77 0.77 0.91 0.89  CALCIUM 9.4 9.1 9.3 8.4* 8.2*  MG  --   --   --  2.0 2.3  PHOS  --   --   --  3.9 2.5   GFR: Estimated Creatinine Clearance: 37.4 mL/min (by C-G formula based on SCr of 0.89 mg/dL). Liver Function Tests: Recent Labs  Lab 06/27/20 0316 06/28/20 0317  AST 27 23  ALT 16 12  ALKPHOS 42 35*  BILITOT 0.5 0.2*   PROT 5.5* 5.0*  ALBUMIN 3.1* 2.6*   No results for input(s): LIPASE, AMYLASE in the last 168 hours. No results for input(s): AMMONIA  in the last 168 hours. Coagulation Profile: No results for input(s): INR, PROTIME in the last 168 hours. Cardiac Enzymes: No results for input(s): CKTOTAL, CKMB, CKMBINDEX, TROPONINI in the last 168 hours. BNP (last 3 results) No results for input(s): PROBNP in the last 8760 hours. HbA1C: Recent Labs    06/28/20 0317  HGBA1C 5.4   CBG: Recent Labs  Lab 06/26/20 1838 06/27/20 0033 06/27/20 0648 06/27/20 1247 06/27/20 1755  GLUCAP 138* 136* 120* 135* 161*   Lipid Profile: No results for input(s): CHOL, HDL, LDLCALC, TRIG, CHOLHDL, LDLDIRECT in the last 72 hours. Thyroid Function Tests: Recent Labs    06/26/20 0647  TSH 3.386   Anemia Panel: Recent Labs    06/28/20 0317  VITAMINB12 257  FOLATE 8.8  FERRITIN 134  TIBC 235*  IRON 12*  RETICCTPCT 1.6   Sepsis Labs: No results for input(s): PROCALCITON, LATICACIDVEN in the last 168 hours.  Recent Results (from the past 240 hour(s))  Resp Panel by RT-PCR (Flu A&B, Covid) Nasopharyngeal Swab     Status: None   Collection Time: 06/25/20  8:18 PM   Specimen: Nasopharyngeal Swab; Nasopharyngeal(NP) swabs in vial transport medium  Result Value Ref Range Status   SARS Coronavirus 2 by RT PCR NEGATIVE NEGATIVE Final    Comment: (NOTE) SARS-CoV-2 target nucleic acids are NOT DETECTED.  The SARS-CoV-2 RNA is generally detectable in upper respiratory specimens during the acute phase of infection. The lowest concentration of SARS-CoV-2 viral copies this assay can detect is 138 copies/mL. A negative result does not preclude SARS-Cov-2 infection and should not be used as the sole basis for treatment or other patient management decisions. A negative result may occur with  improper specimen collection/handling, submission of specimen other than nasopharyngeal swab, presence of viral  mutation(s) within the areas targeted by this assay, and inadequate number of viral copies(<138 copies/mL). A negative result must be combined with clinical observations, patient history, and epidemiological information. The expected result is Negative.  Fact Sheet for Patients:  EntrepreneurPulse.com.au  Fact Sheet for Healthcare Providers:  IncredibleEmployment.be  This test is no t yet approved or cleared by the [place] FDA and  has been authorized for detection and/or diagnosis of SARS-CoV-2 by FDA under an Emergency Use Authorization (EUA). This EUA will remain  in effect (meaning this test can be used) for the duration of the COVID-19 declaration under Section 564(b)(1) of the Act, 21 U.S.C.section 360bbb-3(b)(1), unless the authorization is terminated  or revoked sooner.       Influenza A by PCR NEGATIVE NEGATIVE Final   Influenza B by PCR NEGATIVE NEGATIVE Final    Comment: (NOTE) The Xpert Xpress SARS-CoV-2/FLU/RSV plus assay is intended as an aid in the diagnosis of influenza from Nasopharyngeal swab specimens and should not be used as a sole basis for treatment. Nasal washings and aspirates are unacceptable for Xpert Xpress SARS-CoV-2/FLU/RSV testing.  Fact Sheet for Patients: EntrepreneurPulse.com.au  Fact Sheet for Healthcare Providers: IncredibleEmployment.be  This test is not yet approved or cleared by the [place] FDA and has been authorized for detection and/or diagnosis of SARS-CoV-2 by FDA under an Emergency Use Authorization (EUA). This EUA will remain in effect (meaning this test can be used) for the duration of the COVID-19 declaration under Section 564(b)(1) of the Act, 21 U.S.C. section 360bbb-3(b)(1), unless the authorization is terminated or revoked.  Performed at [facility], [place] [address], [place], [place] [address]   Surgical pcr screen  Status: None    Collection Time: 06/26/20  8:01 AM   Specimen: Nasal Mucosa; Nasal Swab  Result Value Ref Range Status   MRSA, PCR NEGATIVE NEGATIVE Final   Staphylococcus aureus NEGATIVE NEGATIVE Final    Comment: (NOTE) The Xpert SA Assay (FDA approved for NASAL specimens in patients 107 years of age and older), is one component of a comprehensive surveillance program. It is not intended to diagnose infection nor to guide or monitor treatment. Performed at [facility], [address] [place], [place] [address]   Culture, Urine     Status: None   Collection Time: 06/27/20 11:29 AM   Specimen: Urine, Clean Catch  Result Value Ref Range Status   Specimen Description URINE, CLEAN CATCH  Final   Special Requests NONE  Final   Culture   Final    NO GROWTH Performed at [facility], [address] [place], [place] [address]    Report Status 06/28/2020 FINAL  Final     RN Pressure Injury Documentation:     Estimated body mass index is 18.8 kg/m as calculated from the following:   Height as of this encounter: 5\' 5"  (1.651 m).   Weight as of this encounter: 51.3 kg.  Malnutrition Type: Nutrition Problem: Increased nutrient needs Etiology: hip fracture,post-op healing Malnutrition Characteristics: Signs/Symptoms: estimated needs Nutrition Interventions: Interventions: Ensure Enlive (each supplement provides 350kcal and 20 grams of protein),MVI   Radiology Studies: No results found. Scheduled Meds: . aspirin  81 mg Oral BID  . docusate sodium  100 mg Oral BID  . feeding supplement  237 mL Oral TID BM  . ferrous sulfate  325 mg Oral TID PC  . multivitamin with minerals  1 tablet Oral Daily  . polyethylene glycol  17 g Oral BID   Continuous Infusions: . sodium chloride 75 mL/hr at 06/27/20 1752  . cefTRIAXone (ROCEPHIN)  IV 1 g (06/27/20 1836)  . lactated ringers 10 mL/hr at 06/26/20 0835  . methocarbamol (ROBAXIN) IV      LOS: 3 days   06/28/20, DO [facility] PAGER is on AMION  If 7PM-7AM, please contact night-coverage [email or web address]

## 2020-06-28 NOTE — Progress Notes (Signed)
Subjective: 2 Days Post-Op Procedure(s) (LRB): ARTHROPLASTY BIPOLAR HIP (HEMIARTHROPLASTY) (Right) Patient reports pain as mild.    Objective: Vital signs in last 24 hours: Temp:  [97.4 F (36.3 C)-97.8 F (36.6 C)] 97.8 F (36.6 C) (01/01 0407) Pulse Rate:  [69-99] 78 (01/01 0407) Resp:  [14-18] 18 (01/01 0407) BP: (97-116)/(47-65) 116/58 (01/01 0407) SpO2:  [97 %-100 %] 100 % (01/01 0407)  Intake/Output from previous day: 12/31 0701 - 01/01 0700 In: 1758.6 [P.O.:440; I.V.:1318.6] Out: -  Intake/Output this shift: No intake/output data recorded.  Recent Labs    06/25/20 1948 06/26/20 0413 06/27/20 0316 06/28/20 0317  HGB 12.6 13.0 9.0* 7.3*   Recent Labs    06/27/20 0316 06/28/20 0317  WBC 20.3* 13.4*  RBC 2.93* 2.40*  2.32*  HCT 26.7* 22.0*  PLT 225 191   Recent Labs    06/27/20 0316 06/28/20 0317  NA 129* 131*  K 4.0 4.4  CL 96* 103  CO2 22 22  BUN 24* 34*  CREATININE 0.91 0.89  GLUCOSE 132* 116*  CALCIUM 8.4* 8.2*   No results for input(s): LABPT, INR in the last 72 hours.  Neurologically intact ABD soft Neurovascular intact Sensation intact distally Intact pulses distally Dorsiflexion/Plantar flexion intact Incision: dressing C/D/I and no drainage No cellulitis present Compartment soft no sign of DVT   Assessment/Plan: 2 Days Post-Op Procedure(s) (LRB): ARTHROPLASTY BIPOLAR HIP (HEMIARTHROPLASTY) (Right) Advance diet Up with therapy D/C IV fluids WBAT Posterior hip precautions DVT ppx   Dorothy Spark 06/28/2020, 9:34 AM

## 2020-06-28 NOTE — Evaluation (Signed)
Clinical/Bedside Swallow Evaluation Patient Details  Name: LENNYX VERDELL MRN: [id number] Date of Birth: October 03, 1932  Today's Date: 06/28/2020 Time: SLP Start Time (ACUTE ONLY): 1640 SLP Stop Time (ACUTE ONLY): 1655 SLP Time Calculation (min) (ACUTE ONLY): 15 min  Past Medical History:  Past Medical History:  Diagnosis Date  . CKD (chronic kidney disease)   . Dementia (HCC)   . Hyperlipidemia   . Osteopenia   . Renal disorder    Past Surgical History: History reviewed. No pertinent surgical history. HPI:  The pt is an 87 yo female, resident of [place], presenting with R hip fx after an unwitnessed fall, and is now s/p R THA. CT of head also revealed frontal hematoma. PMH includes: CKD, dementia, HLD, and osteopenia.   Assessment / Plan / Recommendation Clinical Impression  Assessment of patient's swallow at bedside was limited to a few straw sips of water as patient declined/refused all other PO's. She did not present with any overt s/s of difficulty with swallowing; no coughing, throat clearing, etc. Per RN, she seems to be doing fine with her eating, drinking and  no issues have been observed by RN or NT. Patient's refusal to eat is likely due to her dementia. SLP recommended to RN that family could be consulted to determine if any high-interest food items could be offered to patient to maximize oral intake of food/liquids. SLP Visit Diagnosis: Dysphagia, unspecified (R13.10)    Aspiration Risk  No limitations    Diet Recommendation Thin liquid;Regular   Liquid Administration via: Cup;Straw Medication Administration: Whole meds with liquid Supervision: Patient able to self feed Compensations: Minimize environmental distractions Postural Changes: Seated upright at 90 degrees    Other  Recommendations Oral Care Recommendations: Oral care BID   Follow up Recommendations None      Frequency and Duration   N/A         Prognosis   N/A     Swallow Study   General  Date of Onset: 06/25/20 HPI: The pt is an 87 yo female, resident of [place], presenting with R hip fx after an unwitnessed fall, and is now s/p R THA. CT of head also revealed frontal hematoma. PMH includes: CKD, dementia, HLD, and osteopenia. Type of Study: Bedside Swallow Evaluation Previous Swallow Assessment: no Diet Prior to this Study: Regular;Thin liquids Temperature Spikes Noted: No Respiratory Status: Room air History of Recent Intubation: No Behavior/Cognition: Alert;Cooperative;Confused Oral Cavity Assessment: Within Functional Limits Oral Care Completed by SLP: No (patient confused and not allowing for oral care) Oral Cavity - Dentition: Adequate natural dentition Self-Feeding Abilities: Needs set up Patient Positioning: Upright in bed Baseline Vocal Quality: Normal Volitional Cough: Cognitively unable to elicit Volitional Swallow: Unable to elicit    Oral/Motor/Sensory Function Overall Oral Motor/Sensory Function: Within functional limits (assessment limited due to patient participation but appears WFL-WNL)   Ice Chips     Thin Liquid Thin Liquid: Within functional limits Presentation: Straw;Self Fed    Nectar Thick     Honey Thick     Puree Puree: Not tested Other Comments: patient declined   Solid     Solid: Not tested Other Comments: patient declined      Angela Nevin, MA, CCC-SLP Speech Therapy [facility]

## 2020-06-29 DIAGNOSIS — E871 Hypo-osmolality and hyponatremia: Secondary | ICD-10-CM | POA: Diagnosis not present

## 2020-06-29 DIAGNOSIS — S72001A Fracture of unspecified part of neck of right femur, initial encounter for closed fracture: Secondary | ICD-10-CM | POA: Diagnosis not present

## 2020-06-29 DIAGNOSIS — D62 Acute posthemorrhagic anemia: Secondary | ICD-10-CM | POA: Diagnosis not present

## 2020-06-29 LAB — COMPREHENSIVE METABOLIC PANEL
ALT: 17 U/L (ref 0–44)
AST: 31 U/L (ref 15–41)
Albumin: 2.3 g/dL — ABNORMAL LOW (ref 3.5–5.0)
Alkaline Phosphatase: 38 U/L (ref 38–126)
Anion gap: 6 (ref 5–15)
BUN: 31 mg/dL — ABNORMAL HIGH (ref 8–23)
CO2: 22 mmol/L (ref 22–32)
Calcium: 8 mg/dL — ABNORMAL LOW (ref 8.9–10.3)
Chloride: 103 mmol/L (ref 98–111)
Creatinine, Ser: 0.73 mg/dL (ref 0.44–1.00)
GFR, Estimated: >60 mL/min (ref >60)
Glucose, Bld: 96 mg/dL (ref 70–99)
Potassium: 4.5 mmol/L (ref 3.5–5.1)
Sodium: 131 mmol/L — ABNORMAL LOW (ref 135–145)
Total Bilirubin: 0.9 mg/dL (ref 0.3–1.2)
Total Protein: 4.5 g/dL — ABNORMAL LOW (ref 6.5–8.1)

## 2020-06-29 LAB — GLUCOSE, CAPILLARY
Glucose-Capillary: 101 mg/dL — ABNORMAL HIGH (ref 70–99)
Glucose-Capillary: 101 mg/dL — ABNORMAL HIGH (ref 70–99)
Glucose-Capillary: 93 mg/dL (ref 70–99)
Glucose-Capillary: 99 mg/dL (ref 70–99)

## 2020-06-29 LAB — PREPARE RBC (CROSSMATCH)

## 2020-06-29 LAB — CBC WITH DIFFERENTIAL/PLATELET
Abs Immature Granulocytes: 0.05 10*3/uL (ref 0.00–0.07)
Basophils Absolute: 0 10*3/uL (ref 0.0–0.1)
Basophils Relative: 0 %
Eosinophils Absolute: 0.1 10*3/uL (ref 0.0–0.5)
Eosinophils Relative: 1 %
HCT: 19.8 % — ABNORMAL LOW (ref 36.0–46.0)
Hemoglobin: 6.6 g/dL — CL (ref 12.0–15.0)
Immature Granulocytes: 1 %
Lymphocytes Relative: 13 %
Lymphs Abs: 1.4 10*3/uL (ref 0.7–4.0)
MCH: 30.6 pg (ref 26.0–34.0)
MCHC: 33.3 g/dL (ref 30.0–36.0)
MCV: 91.7 fL (ref 80.0–100.0)
Monocytes Absolute: 1.4 10*3/uL — ABNORMAL HIGH (ref 0.1–1.0)
Monocytes Relative: 14 %
Neutro Abs: 7.6 10*3/uL (ref 1.7–7.7)
Neutrophils Relative %: 71 %
Platelets: 218 10*3/uL (ref 150–400)
RBC: 2.16 MIL/uL — ABNORMAL LOW (ref 3.87–5.11)
RDW: 13.7 % (ref 11.5–15.5)
WBC: 10.6 10*3/uL — ABNORMAL HIGH (ref 4.0–10.5)
nRBC: 0 % (ref 0.0–0.2)

## 2020-06-29 LAB — MAGNESIUM: Magnesium: 2.1 mg/dL (ref 1.7–2.4)

## 2020-06-29 LAB — PHOSPHORUS: Phosphorus: 1.5 mg/dL — ABNORMAL LOW (ref 2.5–4.6)

## 2020-06-29 LAB — ABO/RH: ABO/RH(D): O POS

## 2020-06-29 MED ORDER — IOHEXOL 9 MG/ML PO SOLN: 500.0000 mL | ORAL | Status: AC

## 2020-06-29 MED ORDER — POTASSIUM & SODIUM PHOSPHATES 280-160-250 MG PO PACK
1.0000 | PACK | Freq: Three times a day (TID) | ORAL | Status: DC
  Administered 2020-06-29 (×2): 1 via ORAL
  Filled 2020-06-29 (×4): qty 1

## 2020-06-29 MED ORDER — SODIUM CHLORIDE 0.9% IV SOLUTION: Freq: Once | INTRAVENOUS | Status: DC

## 2020-06-29 NOTE — Progress Notes (Signed)
PROGRESS NOTE    Suzanne Hall  ZDG:[id number] DOB: 1932-08-17 DOA: 06/25/2020 PCP: Patient, No Pcp Per   Brief Narrative:  HPI per Dr. Gean Birchwood on 06/25/20 Suzanne Hall is a 87 y.o. female with history of dementia was brought to the ER after patient was found to have unwitnessed fall and was found on the floor.  The exact circumstances of the fall is not clear.  After the fall patient was complaining of right hip pain.  ED Course: In the ER patient's x-ray showed right hip fracture and also CT head shows frontal scalp hematoma.  Labs are significant for sodium of 128 and no old labs to compare.  WBC count 11.8 Covid testing negative EKG shows sinus tachycardia with a first-degree AV block.  Dr. Doran Durand on-call orthopedic surgeon has been consulted and plan is to have surgery in the morning.  **Interim History Patient underwent a right hip hemiarthroplasty utilizing the DePuy component By Dr. Alvan Dame on 06/26/20. She came back to the floor and she is doing okay but does complain of significant hip pain.  Patient sundowns and had to be restrained initially but was removed and now reinitiated as she is a danger to fall.    Sodium and WBC improving.  Also her hemoglobin dropped 6 g since the day before and could be a postsurgical drop with a partial dilutional component as she is getting IV fluid hydration but will need to rule out GI Bleeding and Retroperitoneal Hematoma.  Will reduce IV fluids to 75 mL's per hour for now.  Continue to monitor for further signs and symptoms of bleeding.  TOC will be consulted given that PT recommending SNF.  Patient also appears to have a urinary tract infection however no urine cultures were obtained despite being ordered and being initiated on antibiotics.  Urine cultures finally resulted and of no surprise it showed no growth as she had already been placed on antibiotics.  She continues to be extremely confused and does sundown quite a bit and she is a  danger to herself from the fall given her acute issues will need to restrain her for now  Assessment & Plan:   Principal Problem:   Closed right hip fracture ([place]) Active Problems:   Hyponatremia   Dementia (Suzanne Hall)   Hip fracture ([place])  Right hip fracture unwitnessed fall.   -Orthopedic surgery was consulted and she kept n.p.o. after midnight for surgical intervention -She underwent a right hip hemiarthroplasty today done by Dr. Elliot Dally- -continue pain control with acetaminophen, fentanyl was given IV once, IV morphine, p.o. tramadol 50 to 100 mg every 6 hours for severe pain, methocarbamol and bowel regimen with bisacodyl 10 mg rectally as needed for moderate constipation and also is on MiraLAX 17 g p.o. twice daily -Head CT done and showed "Small right frontal scalp hematoma. No acute intracranial process." -VTE prophylaxis per orthopedic surgery and they are recommending aspirin 81 mg p.o. twice daily GI bleed is suspected -Antiemetics with ondansetron and metoclopramide if ineffective -we will need PT OT evaluation for further disposition planning and they are recommending SNF -Further care per orthopedic surgery and they are recommending advancing her diet, up with therapy and weightbearing as tolerated she is a danger to herself given her significant confusion  Hyponatremia, slowly improving  -Cause not clear.  Will trend metabolic panel check urine studies including urine sodium osmolality check TSH which was 3.38 and cortisol level was 29.2.  -Sodium level is slowly improving and went  from 126 -> 129 -> 131 and is 131 again today -Was Currently getting NS at 100 mL/hr and will reduce to 75 mL/hr -Repeat CMP in the AM   Leukocytosis in the setting of Fall and UTI -In the setting of her fall and likely urinary tract infection -Patient WBC was 11.8 -> 14.9 and now has worsened to 20.3 and is now 13.4 -> 16.2 now trending down to 10.6 today -Urinalysis done and showed a hazy  appearance with moderate hemoglobin, large leukocytes, positive nitrites, many bacteria, 0-5 squamous epithelial cells, greater than 50 WBCs  -Check urine culture unfortunately she received preoperative antibiotics with cefazolin and unsure whether her urine culture will be of any value now -Urine Cx Sent and showed no Growth -Empirically start her on IV ceftriaxone but unfortunately urine culture no growth to date  Elevated blood pressure readings  -we will keep patient on as needed IV hydralazine.  Follow blood pressure trends. -Last blood pressure reading was not done as patient refused Vital Signs -Continue to monitor blood pressures per protocol -Last blood pressure was 122/51  Near Syncope -In the setting of likely orthostatic hypotension from acute blood loss anemia -We will type and screen and transfuse 2 units of PRBCs -We will need orthostatic vital signs done -She is currently getting IV fluids with per hour -May require TED hose -If persists after blood loss has been addressed we will obtain echocardiogram and troponins and further imaging  Acute Blood Loss Anemia -Patient's Hgb/Hct went from 12.6/36.9 -> 13.0/39.6 -> 9.0/26.7 -> 7.3/22. -> 7.9/24.0 -> and today is 6.6/19.8 -We will type and screen and transfuse 2 units of PRBCs -Check Anemia Panel and showed an iron level of 12, U IBC 223, TIBC of 235, saturation ratios of 5%, ferritin level of 134, folate level 8.8, vitamin B12 257 -Could be partly dilutional drop but now likely post-surgical drop -We will obtain FOBT given her drop in hemoglobin and obtain a CT of the abdomen pelvis rule out retroperitoneal hematoma -Continue with ferrous sulfate 325 mg p.o. 3 times daily with meals per orthopedic recommendations but may need to hold if she is having a GI bleed -Continue to Monitor for S/Sx of bleeding; Currently no overt bleeding noted -Repeat CBC in the AM   History of Dementia. -Placed on delirium precautions; was  agitated this morning -Patient Sundowns at night and has been very confused and will need safety sitter and soft mitten restraints as well as a belt restraint  Scalp Hematoma  -seen in the CAT scan.  Hyperglycemia  -Patient's blood sugar ranging from 96 -132 on daily BMPs  -CBG's ranging from 120-161 -Hemoglobin A1c was checked and was 5.4 -Continue to monitor blood sugars carefully and if necessary will place on sensitive NovoLog sign scale insulin AC  Hypophosphatemia -Patient's phosphorus level is 1.5 -Replete with p.o. Phos-NAK 1 packet p.o. 3 times daily with meals and bedtime given that there is a Sport and exercise psychologist of IV sodium phosphate  DVT prophylaxis: SCDs, Aspirin 81 mg p.o. twice daily but may need to hold if she is having GI bleeding Code Status: FULL CODE Family Communication: No family present at bedside Disposition Plan: Pending improvement and work-up of her acute blood loss anemia as well as Ortho clearance and evaluation by PT OT; PT Recommending SNF so will get Social worker to assist with placement   Status is: Inpatient  Remains inpatient appropriate because:Unsafe d/c plan, IV treatments appropriate due to intensity of illness or inability to  take PO and Inpatient level of care appropriate due to severity of illness   Dispo: The patient is from: Home              Anticipated d/c is to: TBD              Anticipated d/c date is: 2 days              Patient currently is not medically stable to d/c.  Consultants:   Orthopedic Surgery   Procedures:  PROCEDURE:  right hip hemiarthroplasty utilizing DePuy component, size 5 standard Actis stem with a 44 unipolar ball with a +5 adapter  Antimicrobials:  Anti-infectives (From admission, onward)   Start     Dose/Rate Route Frequency Ordered Stop   06/26/20 1900  cefTRIAXone (ROCEPHIN) 1 g in sodium chloride 0.9 % 100 mL IVPB        1 g 200 mL/hr over 30 Minutes Intravenous Every 24 hours 06/26/20 1809      06/26/20 1430  ceFAZolin (ANCEF) IVPB 2g/100 mL premix  Status:  Discontinued        2 g 200 mL/hr over 30 Minutes Intravenous Every 6 hours 06/26/20 1134 06/26/20 1811   06/26/20 0845  ceFAZolin (ANCEF) IVPB 2g/100 mL premix        2 g 200 mL/hr over 30 Minutes Intravenous On call to O.R. 06/26/20 0756 06/26/20 0916       Subjective: Seen and examined this a.m. and she was calmer this morning but then became extremely confused and agitated this afternoon try to get out of bed and almost had a syncopal episode.  Nursing caught her at that time.  She is danger to herself so we will restrain her with a soft mitten restraints as well as a belt restraint.  Her hemoglobin has dropped significantly since admission and will work this up and obtain FOBT as well as a CT of the abdomen pelvis to rule out retroperitoneal hematoma  Objective: Vitals:   06/28/20 0407 06/28/20 1515 06/29/20 0842 06/29/20 1257  BP: (!) 116/58 (!) 148/54 (!) 125/46 (!) 122/51  Pulse: 78 97 95 85  Resp: 18 17 18 18   Temp: 97.8 F (36.6 C) 98.5 F (36.9 C) 98.3 F (36.8 C) 98 F (36.7 C)  TempSrc: Oral Oral Oral   SpO2: 100% 96% 100% 100%  Weight:      Height:       No intake or output data in the 24 hours ending 06/29/20 1408 Filed Weights   06/25/20 1940 06/26/20 0819  Weight: 51.3 kg 51.3 kg   Examination: Physical Exam:  Constitutional: WN/WD elderly pleasantly demented Caucasian female who is calm today resting Eyes: Lids and conjunctivae normal, sclerae anicteric  ENMT: External Ears, Nose appear normal. Grossly normal hearing.  Neck: Appears normal, supple, no cervical masses, normal ROM, no appreciable thyromegaly; no JVD Respiratory: Diminished to auscultation bilaterally, no wheezing, rales, rhonchi or crackles. Normal respiratory effort and patient is not tachypenic. No accessory muscle use.  Unlabored breathing Cardiovascular: RRR, no murmurs / rubs / gallops. S1 and S2 auscultated. No extremity  edema.  Abdomen: Soft, non-tender, non-distended.  Bowel sounds positive.  GU: Deferred. Musculoskeletal: No clubbing / cyanosis of digits/nails. No joint deformity upper and lower extremities.  Skin: No rashes, lesions, ulcers on limited skin evaluation and right hip incisions are covered with a Mepilex. No induration; Warm and dry.  Neurologic: CN 2-12 grossly intact with no focal deficits. Romberg sign and cerebellar  reflexes not assessed.  Psychiatric: Impaired judgment and insight.  She is awake and alert but not oriented x 3. Normal mood and appropriate affect.   Data Reviewed: I have personally reviewed following labs and imaging studies  CBC: Recent Labs  Lab 06/25/20 1948 06/26/20 0413 06/27/20 0316 06/28/20 0317 06/28/20 0928 06/29/20 0149  WBC 11.8* 14.9* 20.3* 13.4* 16.2* 10.6*  NEUTROABS 9.6*  --  16.1* 10.8* 13.6* 7.6  HGB 12.6 13.0 9.0* 7.3* 7.9* 6.6*  HCT 36.9 39.6 26.7* 22.0* 24.0* 19.8*  MCV 90.0 89.6 91.1 91.7 92.0 91.7  PLT 283 267 225 191 249 218   Basic Metabolic Panel: Recent Labs  Lab 06/26/20 0413 06/26/20 0647 06/27/20 0316 06/28/20 0317 06/29/20 0149  NA 126* 126* 129* 131* 131*  K 3.8 3.8 4.0 4.4 4.5  CL 93* 93* 96* 103 103  CO2 22 24 22 22 22   GLUCOSE 124* 132* 132* 116* 96  BUN 20 18 24* 34* 31*  CREATININE 0.77 0.77 0.91 0.89 0.73  CALCIUM 9.1 9.3 8.4* 8.2* 8.0*  MG  --   --  2.0 2.3 2.1  PHOS  --   --  3.9 2.5 1.5*   GFR: Estimated Creatinine Clearance: 41.6 mL/min (by C-G formula based on SCr of 0.73 mg/dL). Liver Function Tests: Recent Labs  Lab 06/27/20 0316 06/28/20 0317 06/29/20 0149  AST 27 23 31   ALT 16 12 17   ALKPHOS 42 35* 38  BILITOT 0.5 0.2* 0.9  PROT 5.5* 5.0* 4.5*  ALBUMIN 3.1* 2.6* 2.3*   No results for input(s): LIPASE, AMYLASE in the last 168 hours. No results for input(s): AMMONIA in the last 168 hours. Coagulation Profile: No results for input(s): INR, PROTIME in the last 168 hours. Cardiac Enzymes: No  results for input(s): CKTOTAL, CKMB, CKMBINDEX, TROPONINI in the last 168 hours. BNP (last 3 results) No results for input(s): PROBNP in the last 8760 hours. HbA1C: Recent Labs    06/28/20 0317  HGBA1C 5.4   CBG: Recent Labs  Lab 06/27/20 1247 06/27/20 1755 06/29/20 0011 06/29/20 0705 06/29/20 1230  GLUCAP 135* 161* 101* 93 99   Lipid Profile: No results for input(s): CHOL, HDL, LDLCALC, TRIG, CHOLHDL, LDLDIRECT in the last 72 hours. Thyroid Function Tests: No results for input(s): TSH, T4TOTAL, FREET4, T3FREE, THYROIDAB in the last 72 hours. Anemia Panel: Recent Labs    06/28/20 0317  VITAMINB12 257  FOLATE 8.8  FERRITIN 134  TIBC 235*  IRON 12*  RETICCTPCT 1.6   Sepsis Labs: No results for input(s): PROCALCITON, LATICACIDVEN in the last 168 hours.  Recent Results (from the past 240 hour(s))  Resp Panel by RT-PCR (Flu A&B, Covid) Nasopharyngeal Swab     Status: None   Collection Time: 06/25/20  8:18 PM   Specimen: Nasopharyngeal Swab; Nasopharyngeal(NP) swabs in vial transport medium  Result Value Ref Range Status   SARS Coronavirus 2 by RT PCR NEGATIVE NEGATIVE Final    Comment: (NOTE) SARS-CoV-2 target nucleic acids are NOT DETECTED.  The SARS-CoV-2 RNA is generally detectable in upper respiratory specimens during the acute phase of infection. The lowest concentration of SARS-CoV-2 viral copies this assay can detect is 138 copies/mL. A negative result does not preclude SARS-Cov-2 infection and should not be used as the sole basis for treatment or other patient management decisions. A negative result may occur with  improper specimen collection/handling, submission of specimen other than nasopharyngeal swab, presence of viral mutation(s) within the areas targeted by this assay, and inadequate number  of viral copies(<138 copies/mL). A negative result must be combined with clinical observations, patient history, and epidemiological information. The expected  result is Negative.  Fact Sheet for Patients:  BloggerCourse.com  Fact Sheet for Healthcare Providers:  SeriousBroker.it  This test is no t yet approved or cleared by the [place] FDA and  has been authorized for detection and/or diagnosis of SARS-CoV-2 by FDA under an Emergency Use Authorization (EUA). This EUA will remain  in effect (meaning this test can be used) for the duration of the COVID-19 declaration under Section 564(b)(1) of the Act, 21 U.S.C.section 360bbb-3(b)(1), unless the authorization is terminated  or revoked sooner.       Influenza A by PCR NEGATIVE NEGATIVE Final   Influenza B by PCR NEGATIVE NEGATIVE Final    Comment: (NOTE) The Xpert Xpress SARS-CoV-2/FLU/RSV plus assay is intended as an aid in the diagnosis of influenza from Nasopharyngeal swab specimens and should not be used as a sole basis for treatment. Nasal washings and aspirates are unacceptable for Xpert Xpress SARS-CoV-2/FLU/RSV testing.  Fact Sheet for Patients: BloggerCourse.com  Fact Sheet for Healthcare Providers: SeriousBroker.it  This test is not yet approved or cleared by the [place] FDA and has been authorized for detection and/or diagnosis of SARS-CoV-2 by FDA under an Emergency Use Authorization (EUA). This EUA will remain in effect (meaning this test can be used) for the duration of the COVID-19 declaration under Section 564(b)(1) of the Act, 21 U.S.C. section 360bbb-3(b)(1), unless the authorization is terminated or revoked.  Performed at [facility], [address] [place], [place] [address]   Surgical pcr screen     Status: None   Collection Time: 06/26/20  8:01 AM   Specimen: Nasal Mucosa; Nasal Swab  Result Value Ref Range Status   MRSA, PCR NEGATIVE NEGATIVE Final   Staphylococcus aureus NEGATIVE NEGATIVE Final    Comment: (NOTE) The Xpert SA Assay  (FDA approved for NASAL specimens in patients 30 years of age and older), is one component of a comprehensive surveillance program. It is not intended to diagnose infection nor to guide or monitor treatment. Performed at [facility], [address] [place], [place] [address]   Culture, Urine     Status: None   Collection Time: 06/27/20 11:29 AM   Specimen: Urine, Clean Catch  Result Value Ref Range Status   Specimen Description URINE, CLEAN CATCH  Final   Special Requests NONE  Final   Culture   Final    NO GROWTH Performed at [facility], [address] [place], [place] [address]    Report Status 06/28/2020 FINAL  Final     RN Pressure Injury Documentation:     Estimated body mass index is 18.8 kg/m as calculated from the following:   Height as of this encounter: 5\' 5"  (1.651 m).   Weight as of this encounter: 51.3 kg.  Malnutrition Type: Nutrition Problem: Increased nutrient needs Etiology: hip fracture,post-op healing Malnutrition Characteristics: Signs/Symptoms: estimated needs Nutrition Interventions: Interventions: Ensure Enlive (each supplement provides 350kcal and 20 grams of protein),MVI   Radiology Studies: No results found. Scheduled Meds: . sodium chloride   Intravenous Once  . docusate sodium  100 mg Oral BID  . feeding supplement  237 mL Oral TID BM  . ferrous sulfate  325 mg Oral TID PC  . multivitamin with minerals  1 tablet Oral Daily  . polyethylene glycol  17 g Oral BID  . potassium & sodium  phosphates  1 packet Oral TID WC & HS   Continuous Infusions: . sodium chloride 75 mL/hr at 06/27/20 1752  . cefTRIAXone (ROCEPHIN)  IV 200 mL/hr at 06/28/20 1854  . lactated ringers 10 mL/hr at 06/26/20 0835  . methocarbamol (ROBAXIN) IV      LOS: 4 days   Merlene Laughter, DO [facility] PAGER is on AMION  If 7PM-7AM, please contact night-coverage [email or web address]

## 2020-06-29 NOTE — Evaluation (Signed)
Occupational Therapy Evaluation Patient Details Name: Suzanne Hall MRN: [id number] DOB: [birth year] Today's Date: 06/29/2020    History of Present Illness The pt is an 87 yo female presenting with R hip fx after an unwitnessed fall, and is now s/p R THA. CT of head also revealed frontal hematoma. PMH includes: CKD, dementia, HLD, and osteopenia.   Clinical Impression   Pt PTA: Pt from ALF and was nearly independent for mobility and ADL with dementia at baseline. Pt currently limited by decreased strength, decreased ability to care for self and decreased mobility. Pt unable to recall precautions or comprehend not getting OOB.  Pt's son present for session and helpful. RN in room assisting as pt nearly "passed out" and abruptly sitting down on EOB requiring immediate assist back in bed. BP 127/47, HR 80s, O2 100%. Pt with no LOC, just extremely fatigued. Pt ambulating ~75' in room with 1 seated rest break with RW for stability and modA overall to navigate through room. Pt would greatly benefit from continued OT skilled services for ADL, mobility and safety in SNF setting. OT following acutely.     Follow Up Recommendations  SNF;Supervision/Assistance - 24 hour    Equipment Recommendations  3 in 1 bedside commode    Recommendations for Other Services       Precautions / Restrictions Precautions Precautions: Posterior Hip;Fall Precaution Booklet Issued: Yes (comment) Precaution Comments: handout hung in room and given to pt's son Restrictions Weight Bearing Restrictions: Yes RLE Weight Bearing: Weight bearing as tolerated      Mobility Bed Mobility Overal bed mobility: Needs Assistance Bed Mobility: Supine to Sit;Sit to Supine     Supine to sit: Max assist;HOB elevated Sit to supine: Max assist;HOB elevated   General bed mobility comments: maxA for BLE to EOB    Transfers Overall transfer level: Needs assistance Equipment used: None Transfers: Stand Pivot Transfers    Stand pivot transfers: Mod assist;From elevated surface       General transfer comment: Pt transferring to commode; pt ambulating back to recliner and requiring assist to get back in bed due to pt prematurely sitting down.    Balance Overall balance assessment: Needs assistance Sitting-balance support: Single extremity supported;Feet supported Sitting balance-Leahy Scale: Poor     Standing balance support: Bilateral upper extremity supported Standing balance-Leahy Scale: Poor Standing balance comment: use of RW for mobility                           ADL either performed or assessed with clinical judgement   ADL Overall ADL's : Needs assistance/impaired Eating/Feeding: Set up;Sitting   Grooming: Minimal assistance;Sitting   Upper Body Bathing: Minimal assistance;Sitting   Lower Body Bathing: Maximal assistance;Sitting/lateral leans;Sit to/from stand   Upper Body Dressing : Minimal assistance;Sitting   Lower Body Dressing: Maximal assistance;Sitting/lateral leans;Sit to/from stand   Toilet Transfer: Min guard;RW;Stand-pivot   Toileting- Architect and Hygiene: Maximal assistance;Sitting/lateral lean;Sit to/from stand       Functional mobility during ADLs: Moderate assistance;Rolling walker (difficulty using RW and requiring assist to bed for premature sitting down) General ADL Comments: Pt limited by decreased strength, decreased ability to care for self and decreased mobility. Pt unable to recall precautions or comprehend not getting OOB.     Vision Baseline Vision/History: No visual deficits Patient Visual Report: No change from baseline Vision Assessment?: No apparent visual deficits     Perception     Praxis  Pertinent Vitals/Pain Pain Assessment: Faces Faces Pain Scale: Hurts little more Pain Location: R hip with movement Pain Descriptors / Indicators: Grimacing;Discomfort;Moaning Pain Intervention(s): Monitored during session      Hand Dominance Right   Extremity/Trunk Assessment Upper Extremity Assessment Upper Extremity Assessment: Generalized weakness   Lower Extremity Assessment Lower Extremity Assessment: Generalized weakness;RLE deficits/detail RLE Deficits / Details: s/p hip sx   Cervical / Trunk Assessment Cervical / Trunk Assessment: Kyphotic   Communication Communication Communication: No difficulties   Cognition Arousal/Alertness: Awake/alert Behavior During Therapy: WFL for tasks assessed/performed Overall Cognitive Status: History of cognitive impairments - at baseline                                 General Comments: pt with dementia at baseline, noted STM deficits through session as pt unable to remember any precautions despite repeated simple instructions. Pt seems to forget conversation after ~5 minutes, asking son same questions multiple times through session   General Comments  Pt's son present for session and helpful. RN in room assisting as pt nearly "passed out" and abruptly sitting down on EOB requiring immediate assist back in bed. BP 127/47, HR 80s, O2 100%. Pt with no LOC, just extremely fatigued. (first time pt ambulating ~75' in room with 1 seated rest break)    Exercises     Shoulder Instructions      Home Living Family/patient expects to be discharged to:: Skilled nursing facility                                 Additional Comments: pt unreliable historian. per chart, pt from assisted living      Prior Functioning/Environment Level of Independence: Needs assistance  Gait / Transfers Assistance Needed: per son, pt was ambulatory in ALF with no AD ADL's / Homemaking Assistance Needed: Pt's son reporting that pt could perform own ADL and IADL was provided for pt.            OT Problem List: Decreased strength;Decreased activity tolerance;Impaired balance (sitting and/or standing);Decreased safety awareness;Pain;Increased  edema;Decreased knowledge of use of DME or AE;Decreased knowledge of precautions;Decreased cognition      OT Treatment/Interventions: Self-care/ADL training;Therapeutic exercise;Energy conservation;DME and/or AE instruction;Therapeutic activities;Patient/family education;Balance training;Cognitive remediation/compensation    OT Goals(Current goals can be found in the care plan section) Acute Rehab OT Goals Patient Stated Goal: to walk OT Goal Formulation: With patient/family Time For Goal Achievement: 07/13/20 Potential to Achieve Goals: Good ADL Goals Pt Will Perform Grooming: with supervision;standing Pt Will Perform Lower Body Dressing: with min assist;sit to/from stand;sitting/lateral leans Pt Will Transfer to Toilet: with min guard assist;ambulating Pt Will Perform Toileting - Clothing Manipulation and hygiene: with min guard assist;sitting/lateral leans;sit to/from stand Pt/caregiver will Perform Home Exercise Program: Increased strength;Both right and left upper extremity;With Supervision Additional ADL Goal #1: Pt will increase to minguardA for bed mobility as pre ADL tasks.  OT Frequency: Min 2X/week   Barriers to D/C:            Co-evaluation              AM-PAC OT "6 Clicks" Daily Activity     Outcome Measure Help from another person eating meals?: A Little Help from another person taking care of personal grooming?: A Little Help from another person toileting, which includes using toliet, bedpan, or urinal?: A Lot Help  from another person bathing (including washing, rinsing, drying)?: A Lot Help from another person to put on and taking off regular upper body clothing?: A Little Help from another person to put on and taking off regular lower body clothing?: A Lot 6 Click Score: 15   End of Session Equipment Utilized During Treatment: Gait belt;Rolling walker Nurse Communication: Mobility status;Precautions;Weight bearing status  Activity Tolerance: Patient  tolerated treatment well;Patient limited by pain Patient left: in bed;with call bell/phone within reach;with bed alarm set;with nursing/sitter in room;with family/visitor present  OT Visit Diagnosis: Unsteadiness on feet (R26.81);Muscle weakness (generalized) (M62.81);Pain                Time: 1000-1038 OT Time Calculation (min): 38 min Charges:  OT General Charges $OT Visit: 1 Visit OT Evaluation $OT Eval Moderate Complexity: 1 Mod OT Treatments $Self Care/Home Management : 8-22 mins $Therapeutic Activity: 8-22 mins  Flora Lipps, OTR/L Acute Rehabilitation Services Pager: [phone] Office: [phone]   Suzanne Hall C 06/29/2020, 3:44 PM

## 2020-06-29 NOTE — Progress Notes (Signed)
Text paged [facility] x2 about Hgb 6.6. Awaiting orders.

## 2020-06-29 NOTE — Progress Notes (Signed)
CRITICAL VALUE ALERT  Critical Value:  Hgb 6.6  Date & Time Notied: 06/29/2020  Provider Notified: [facility]  Orders Received/Actions taken: awaiting orders

## 2020-06-29 NOTE — Progress Notes (Signed)
Patient ID: Suzanne Hall, female   DOB: 07-03-1932, 87 y.o.   MRN: [id number] Subjective: 3 Days Post-Op Procedure(s) (LRB): ARTHROPLASTY BIPOLAR HIP (HEMIARTHROPLASTY) (Right)    Patient reports pain as mild. Confused, pulled out IV  Objective:   VITALS:   Vitals:   06/28/20 0407 06/28/20 1515  BP: (!) 116/58 (!) 148/54  Pulse: 78 97  Resp: 18 17  Temp: 97.8 F (36.6 C) 98.5 F (36.9 C)  SpO2: 100% 96%    Neurovascular intact Incision: dressing C/D/I - minor ecchymosis but not dressing saturation  LABS Recent Labs    06/28/20 0317 06/28/20 0928 06/29/20 0149  HGB 7.3* 7.9* 6.6*  HCT 22.0* 24.0* 19.8*  WBC 13.4* 16.2* 10.6*  PLT 191 249 218    Recent Labs    06/27/20 0316 06/28/20 0317 06/29/20 0149  NA 129* 131* 131*  K 4.0 4.4 4.5  BUN 24* 34* 31*  CREATININE 0.91 0.89 0.73  GLUCOSE 132* 116* 96    No results for input(s): LABPT, INR in the last 72 hours.   Assessment/Plan: 3 Days Post-Op Procedure(s) (LRB): ARTHROPLASTY BIPOLAR HIP (HEMIARTHROPLASTY) (Right)   Up with therapy   ABLA - current Hgb is 6.6 Should probably get 2 units PRBCs but getting IV and giving blood may prove to be a challenge From right hip standpoint I am not concerned about any active bleeding Source of blood loss not readily evident, currently on 81mg  ASA for DVT prophylaxis - this could be held and SCDs used if concern for GI bleed  Dementia with confusion and compliance issues - may need bilateral mittens to prevent her from pulling out any IV when receiving PRBCs

## 2020-06-30 ENCOUNTER — Inpatient Hospital Stay (HOSPITAL_COMMUNITY): Payer: Medicare Other

## 2020-06-30 ENCOUNTER — Encounter (HOSPITAL_COMMUNITY): Payer: Self-pay | Admitting: Orthopedic Surgery

## 2020-06-30 DIAGNOSIS — E871 Hypo-osmolality and hyponatremia: Secondary | ICD-10-CM | POA: Diagnosis not present

## 2020-06-30 DIAGNOSIS — D62 Acute posthemorrhagic anemia: Secondary | ICD-10-CM | POA: Diagnosis not present

## 2020-06-30 DIAGNOSIS — N390 Urinary tract infection, site not specified: Secondary | ICD-10-CM | POA: Diagnosis not present

## 2020-06-30 DIAGNOSIS — S72001A Fracture of unspecified part of neck of right femur, initial encounter for closed fracture: Secondary | ICD-10-CM | POA: Diagnosis not present

## 2020-06-30 LAB — CBC WITH DIFFERENTIAL/PLATELET
Abs Immature Granulocytes: 0.02 10*3/uL (ref 0.00–0.07)
Basophils Absolute: 0 10*3/uL (ref 0.0–0.1)
Basophils Relative: 0 %
Eosinophils Absolute: 0.2 10*3/uL (ref 0.0–0.5)
Eosinophils Relative: 2 %
HCT: 23.3 % — ABNORMAL LOW (ref 36.0–46.0)
Hemoglobin: 7.7 g/dL — ABNORMAL LOW (ref 12.0–15.0)
Immature Granulocytes: 0 %
Lymphocytes Relative: 15 %
Lymphs Abs: 1 10*3/uL (ref 0.7–4.0)
MCH: 30 pg (ref 26.0–34.0)
MCHC: 33 g/dL (ref 30.0–36.0)
MCV: 90.7 fL (ref 80.0–100.0)
Monocytes Absolute: 1.1 10*3/uL — ABNORMAL HIGH (ref 0.1–1.0)
Monocytes Relative: 17 %
Neutro Abs: 4.4 10*3/uL (ref 1.7–7.7)
Neutrophils Relative %: 66 %
Platelets: 256 10*3/uL (ref 150–400)
RBC: 2.57 MIL/uL — ABNORMAL LOW (ref 3.87–5.11)
RDW: 14.4 % (ref 11.5–15.5)
WBC: 6.8 10*3/uL (ref 4.0–10.5)
nRBC: 0 % (ref 0.0–0.2)

## 2020-06-30 LAB — COMPREHENSIVE METABOLIC PANEL
ALT: 15 U/L (ref 0–44)
AST: 24 U/L (ref 15–41)
Albumin: 2.3 g/dL — ABNORMAL LOW (ref 3.5–5.0)
Alkaline Phosphatase: 39 U/L (ref 38–126)
Anion gap: 9 (ref 5–15)
BUN: 18 mg/dL (ref 8–23)
CO2: 25 mmol/L (ref 22–32)
Calcium: 8.2 mg/dL — ABNORMAL LOW (ref 8.9–10.3)
Chloride: 96 mmol/L — ABNORMAL LOW (ref 98–111)
Creatinine, Ser: 0.76 mg/dL (ref 0.44–1.00)
GFR, Estimated: >60 mL/min (ref >60)
Glucose, Bld: 96 mg/dL (ref 70–99)
Potassium: 4.3 mmol/L (ref 3.5–5.1)
Sodium: 130 mmol/L — ABNORMAL LOW (ref 135–145)
Total Bilirubin: 0.6 mg/dL (ref 0.3–1.2)
Total Protein: 4.8 g/dL — ABNORMAL LOW (ref 6.5–8.1)

## 2020-06-30 LAB — GLUCOSE, CAPILLARY
Glucose-Capillary: 116 mg/dL — ABNORMAL HIGH (ref 70–99)
Glucose-Capillary: 89 mg/dL (ref 70–99)
Glucose-Capillary: 94 mg/dL (ref 70–99)

## 2020-06-30 LAB — HEMOGLOBIN AND HEMATOCRIT, BLOOD
HCT: 33.3 % — ABNORMAL LOW (ref 36.0–46.0)
Hemoglobin: 11.5 g/dL — ABNORMAL LOW (ref 12.0–15.0)

## 2020-06-30 LAB — MAGNESIUM: Magnesium: 2 mg/dL (ref 1.7–2.4)

## 2020-06-30 LAB — PREPARE RBC (CROSSMATCH)

## 2020-06-30 LAB — PATHOLOGIST SMEAR REVIEW

## 2020-06-30 LAB — PHOSPHORUS: Phosphorus: 3 mg/dL (ref 2.5–4.6)

## 2020-06-30 MED ORDER — SENNOSIDES-DOCUSATE SODIUM 8.6-50 MG PO TABS
1.0000 | ORAL_TABLET | Freq: Two times a day (BID) | ORAL | Status: DC
  Administered 2020-06-30 – 2020-07-07 (×11): 1 via ORAL
  Filled 2020-06-30 (×11): qty 1

## 2020-06-30 MED ORDER — SODIUM CHLORIDE 0.9% IV SOLUTION: Freq: Once | INTRAVENOUS | Status: AC

## 2020-06-30 NOTE — Plan of Care (Signed)
  Problem: Education: Goal: Knowledge of General Education information will improve Description: Including pain rating scale, medication(s)/side effects and non-pharmacologic comfort measures Outcome: Progressing   Problem: Health Behavior/Discharge Planning: Goal: Ability to manage health-related needs will improve Outcome: Progressing   Problem: Clinical Measurements: Goal: Will remain free from infection Outcome: Progressing   

## 2020-06-30 NOTE — Progress Notes (Signed)
Non violent restraint discontinued this am because patient is calm and asleep, will continue to monitor.

## 2020-06-30 NOTE — Progress Notes (Signed)
Non violent restraint discontinued at 6am this morning, pt is more relax and asleep this morning

## 2020-06-30 NOTE — Progress Notes (Signed)
PROGRESS NOTE    Suzanne Hall  BJY:[id number] DOB: Oct 30, 1932 DOA: 06/25/2020 PCP: Patient, No Pcp Per   Brief Narrative:  HPI per Dr. Midge MiniumArshad Kakrakandy on 06/25/20 Suzanne Hall is a 87 y.o. female with history of dementia was brought to the ER after patient was found to have unwitnessed fall and was found on the floor.  The exact circumstances of the fall is not clear.  After the fall patient was complaining of right hip pain.  ED Course: In the ER patient's x-ray showed right hip fracture and also CT head shows frontal scalp hematoma.  Labs are significant for sodium of 128 and no old labs to compare.  WBC count 11.8 Covid testing negative EKG shows sinus tachycardia with a first-degree AV block.  Dr. Victorino DikeHewitt on-call orthopedic surgeon has been consulted and plan is to have surgery in the morning.  **Interim History Patient underwent a right hip hemiarthroplasty utilizing the DePuy component By Dr. Charlann Boxerlin on 06/26/20. She came back to the floor and she is doing okay but does complain of significant hip pain.  Patient sundowns and had to be restrained initially but was removed and now reinitiated as she is a danger to fall.    Sodium and WBC improving.  Also her hemoglobin dropped 6 g since the day before and could be a postsurgical drop with a partial dilutional component as she is getting IV fluid hydration but will need to rule out GI Bleeding and Retroperitoneal Hematoma.  Will reduce IV fluids to 75 mL's per hour for now.  She was typed and screened and transfused 2 units of PRBCs and will get another additional 1 unit today given that her hemoglobin remained lower and was 7.7 this a.m.  Continue to monitor for further signs and symptoms of bleeding.  TOC will be consulted given that PT recommending SNF.  Patient also appears to have a urinary tract infection however no urine cultures were obtained despite being ordered and being initiated on antibiotics.  Urine cultures finally resulted  and of no surprise it showed no growth as she had already been placed on antibiotics.  She continues to be extremely confused and does sundown quite a bit and she is a danger to herself from the fall given her acute issues so she was placed in restraints yesterday but will remove them today as she appears more calm.  CT of the abdomen pelvis was ordered yesterday to rule out retroperitoneal hematoma still pending.  FOBT is not collected yet.  Assessment & Plan:   Principal Problem:   Closed right hip fracture (HCC) Active Problems:   Hyponatremia   Dementia (HCC)   Hip fracture (HCC)  Right hip fracture unwitnessed fall.   -Orthopedic surgery was consulted and she kept n.p.o. after midnight for surgical intervention -She underwent a right hip hemiarthroplasty today done by Dr. Horton FinerIllness- -continue pain control with acetaminophen, fentanyl was given IV once, IV morphine, p.o. tramadol 50 to 100 mg every 6 hours for severe pain, methocarbamol and bowel regimen with bisacodyl 10 mg rectally as needed for moderate constipation and also is on MiraLAX 17 g p.o. twice daily -Head CT done and showed "Small right frontal scalp hematoma. No acute intracranial process." -VTE prophylaxis per orthopedic surgery and they are recommending aspirin 81 mg p.o. twice daily GI bleed is suspected -Antiemetics with ondansetron and metoclopramide if ineffective -we will need PT OT evaluation for further disposition planning and they are recommending SNF -Further care per orthopedic  surgery and they are recommending advancing her diet, up with therapy and weightbearing as tolerated she is a danger to herself given her significant confusion  Hyponatremia, slowly improving  -Cause not clear.  Will trend metabolic panel check urine studies including urine sodium osmolality check TSH which was 3.38 and cortisol level was 29.2.  -Sodium level is slowly improving and went from 126 -> 129 -> 131 and has now trended down to  130 -Was Currently getting NS at 100 mL/hr and will reduce to 75 mL/hr and may stop IV fluid hydration altogether. -Repeat CMP in the AM   Leukocytosis in the setting of Fall and UTI -In the setting of her fall and likely urinary tract infection -Patient WBC was 11.8 -> 14.9 and now has worsened to 20.3 and is now 13.4 -> 16.2 now trending down to 10.6 yesterday and today 6.9 -Urinalysis done and showed a hazy appearance with moderate hemoglobin, large leukocytes, positive nitrites, many bacteria, 0-5 squamous epithelial cells, greater than 50 WBCs  -Check urine culture unfortunately she received preoperative antibiotics with cefazolin and unsure whether her urine culture will be of any value now -Urine Cx Sent and showed no Growth -Empirically start her on IV ceftriaxone but unfortunately urine culture no growth to date  Elevated blood pressure readings  -we will keep patient on as needed IV hydralazine.  Follow blood pressure trends. -Last blood pressure reading was not done as patient refused Vital Signs -Continue to monitor blood pressures per protocol -Last blood pressure was 122/51  Near Syncope -In the setting of likely orthostatic hypotension from acute blood loss anemia -We will type and screen and transfuse 2 units of PRBCs -We will need orthostatic vital signs done -She is currently getting IV fluids with per hour -May require TED hose -If persists after blood loss has been addressed we will obtain echocardiogram and troponins and further imaging  Acute Blood Loss Anemia -Patient's Hgb/Hct went from 12.6/36.9 -> 13.0/39.6 -> 9.0/26.7 -> 7.3/22. -> 7.9/24.0 -> 6.6/19.8 and today it is 7.7/23.3 -She was Typed and Screened and transfuse 2 units of PRBCs on 06/29/2020; will transfuse 1 more additional unit today -Check Anemia Panel and showed an iron level of 12, U IBC 223, TIBC of 235, saturation ratios of 5%, ferritin level of 134, folate level 8.8, vitamin B12 257 -Could be  partly dilutional drop but now likely post-surgical drop -We will obtain FOBT given her drop in hemoglobin and obtain a CT of the abdomen pelvis rule out retroperitoneal hematoma; his abdomen pelvis is still pending -Continue with ferrous sulfate 325 mg p.o. 3 times daily with meals per orthopedic recommendations but may need to hold if she is having a GI bleed -Continue to Monitor for S/Sx of bleeding; Currently no overt bleeding noted -Repeat CBC in the AM   History of Dementia. -Placed on delirium precautions; was agitated this morning -Patient Sundowns at night and has been very confused and will need safety sitter and soft mitten restraints as well as a belt restraint if she continues to sundown; soft restraints were removed this morning.  Scalp Hematoma  -seen in the CAT scan.  Hyperglycemia  -Patient's blood sugar ranging from 96 -132 on daily BMPs  -CBG's ranging from 89-1 01 -Hemoglobin A1c was checked and was 5.4 -Continue to monitor blood sugars carefully and if necessary will place on sensitive NovoLog sign scale insulin AC  Hypophosphatemia -Patient's phosphorus level was 1.5 and is improved to 3.0 after repletion -Replete with p.o.  Phos-NAK 1 packet p.o. 3 times daily with meals and bedtime given that there is a Sport and exercise psychologist of IV sodium phosphate yesterday and now will stop D5 and continue to monitor and replete as necessary -Repeat phosphorus level in the a.m.  DVT prophylaxis: SCDs, Aspirin 81 mg p.o. twice daily but may need to hold if she is having GI bleeding Code Status: FULL CODE Family Communication: No family present at bedside Disposition Plan: Pending improvement and work-up of her acute blood loss anemia as well as Ortho clearance and evaluation by PT OT; PT Recommending SNF so will get Social worker to assist with placement and currently still working her up for her acute drop in anemia  Status is: Inpatient  Remains inpatient appropriate because:Unsafe  d/c plan, IV treatments appropriate due to intensity of illness or inability to take PO and Inpatient level of care appropriate due to severity of illness   Dispo: The patient is from: Home              Anticipated d/c is to: TBD              Anticipated d/c date is: 2 days              Patient currently is not medically stable to d/c.  Consultants:   Orthopedic Surgery   Procedures:  PROCEDURE:  right hip hemiarthroplasty utilizing DePuy component, size 5 standard Actis stem with a 44 unipolar ball with a +5 adapter  Antimicrobials:  Anti-infectives (From admission, onward)   Start     Dose/Rate Route Frequency Ordered Stop   06/26/20 1900  cefTRIAXone (ROCEPHIN) 1 g in sodium chloride 0.9 % 100 mL IVPB        1 g 200 mL/hr over 30 Minutes Intravenous Every 24 hours 06/26/20 1809     06/26/20 1430  ceFAZolin (ANCEF) IVPB 2g/100 mL premix  Status:  Discontinued        2 g 200 mL/hr over 30 Minutes Intravenous Every 6 hours 06/26/20 1134 06/26/20 1811   06/26/20 0845  ceFAZolin (ANCEF) IVPB 2g/100 mL premix        2 g 200 mL/hr over 30 Minutes Intravenous On call to O.R. 06/26/20 0756 06/26/20 0916       Subjective: Seen and examined this a.m. and she is resting and asleep and nursing states that she is calmer this morning.  CT scan of the abdomen pelvis still not done yet as patient did not really drink her contrast yesterday but will try again today.  Will transfuse 1 more unit of PRBCs today.  Nursing reported no other concerns or complaints at this time.  Patient remains calm at this point.  Objective: Vitals:   06/30/20 0531 06/30/20 0734 06/30/20 0943 06/30/20 1008  BP: (!) 123/55 (!) 152/62 (!) 151/98 (!) 127/58  Pulse: 72 82 91 67  Resp: 16 17  15   Temp: 98.4 F (36.9 C) 98 F (36.7 C) 98 F (36.7 C) 98.7 F (37.1 C)  TempSrc: Oral Axillary Axillary Axillary  SpO2: 98% 100% 100% 99%  Weight:      Height:        Intake/Output Summary (Last 24 hours) at  06/30/2020 1345 Last data filed at 06/30/2020 1231 Gross per 24 hour  Intake 1226.5 ml  Output 1150 ml  Net 76.5 ml   Filed Weights   06/25/20 1940 06/26/20 0819  Weight: 51.3 kg 51.3 kg   Examination: Physical Exam:  Constitutional: WN/WD elderly Caucasian female  who is somnolent and drowsy and asleep appears calm and comfortable Eyes: Lids appear normal ENMT: External Ears, Nose appear normal.  Neck: Appears normal, supple, no cervical masses, normal ROM, no appreciable thyromegaly; no JVD Respiratory: Diminished to auscultation bilaterally, no wheezing, rales, rhonchi or crackles. Normal respiratory effort and patient is not tachypenic. No accessory muscle use.  Unlabored breathing Cardiovascular: RRR, no murmurs / rubs / gallops. S1 and S2 auscultated.  No peripheral extremity edema Abdomen: Soft, non-tender, non-distended. Bowel sounds positive.  GU: Deferred. Musculoskeletal: No clubbing / cyanosis of digits/nails. No joint deformity upper and lower extremities.  Skin: Right hip is slightly bruised and has a Mepilex pad on where her incisions are.. No induration; Warm and dry.  Neurologic: Patient is somnolent and drowsy and does not participate in her examination she wants to rest and sleep.  Psychiatric: Impaired judgment and insight.  She is somnolent and drowsy.    Data Reviewed: I have personally reviewed following labs and imaging studies  CBC: Recent Labs  Lab 06/27/20 0316 06/28/20 0317 06/28/20 0928 06/29/20 0149 06/30/20 0449  WBC 20.3* 13.4* 16.2* 10.6* 6.8  NEUTROABS 16.1* 10.8* 13.6* 7.6 4.4  HGB 9.0* 7.3* 7.9* 6.6* 7.7*  HCT 26.7* 22.0* 24.0* 19.8* 23.3*  MCV 91.1 91.7 92.0 91.7 90.7  PLT 225 191 249 218 256   Basic Metabolic Panel: Recent Labs  Lab 06/26/20 0647 06/27/20 0316 06/28/20 0317 06/29/20 0149 06/30/20 0449  NA 126* 129* 131* 131* 130*  K 3.8 4.0 4.4 4.5 4.3  CL 93* 96* 103 103 96*  CO2 24 22 22 22 25   GLUCOSE 132* 132* 116* 96 96   BUN 18 24* 34* 31* 18  CREATININE 0.77 0.91 0.89 0.73 0.76  CALCIUM 9.3 8.4* 8.2* 8.0* 8.2*  MG  --  2.0 2.3 2.1 2.0  PHOS  --  3.9 2.5 1.5* 3.0   GFR: Estimated Creatinine Clearance: 41.6 mL/min (by C-G formula based on SCr of 0.76 mg/dL). Liver Function Tests: Recent Labs  Lab 06/27/20 0316 06/28/20 0317 06/29/20 0149 06/30/20 0449  AST 27 23 31 24   ALT 16 12 17 15   ALKPHOS 42 35* 38 39  BILITOT 0.5 0.2* 0.9 0.6  PROT 5.5* 5.0* 4.5* 4.8*  ALBUMIN 3.1* 2.6* 2.3* 2.3*   No results for input(s): LIPASE, AMYLASE in the last 168 hours. No results for input(s): AMMONIA in the last 168 hours. Coagulation Profile: No results for input(s): INR, PROTIME in the last 168 hours. Cardiac Enzymes: No results for input(s): CKTOTAL, CKMB, CKMBINDEX, TROPONINI in the last 168 hours. BNP (last 3 results) No results for input(s): PROBNP in the last 8760 hours. HbA1C: Recent Labs    06/28/20 0317  HGBA1C 5.4   CBG: Recent Labs  Lab 06/29/20 0705 06/29/20 1230 06/29/20 1631 06/30/20 0030 06/30/20 1124  GLUCAP 93 99 101* 89 94   Lipid Profile: No results for input(s): CHOL, HDL, LDLCALC, TRIG, CHOLHDL, LDLDIRECT in the last 72 hours. Thyroid Function Tests: No results for input(s): TSH, T4TOTAL, FREET4, T3FREE, THYROIDAB in the last 72 hours. Anemia Panel: Recent Labs    06/28/20 0317  VITAMINB12 257  FOLATE 8.8  FERRITIN 134  TIBC 235*  IRON 12*  RETICCTPCT 1.6   Sepsis Labs: No results for input(s): PROCALCITON, LATICACIDVEN in the last 168 hours.  Recent Results (from the past 240 hour(s))  Resp Panel by RT-PCR (Flu A&B, Covid) Nasopharyngeal Swab     Status: None   Collection Time: 06/25/20  8:18  PM   Specimen: Nasopharyngeal Swab; Nasopharyngeal(NP) swabs in vial transport medium  Result Value Ref Range Status   SARS Coronavirus 2 by RT PCR NEGATIVE NEGATIVE Final    Comment: (NOTE) SARS-CoV-2 target nucleic acids are NOT DETECTED.  The SARS-CoV-2 RNA is  generally detectable in upper respiratory specimens during the acute phase of infection. The lowest concentration of SARS-CoV-2 viral copies this assay can detect is 138 copies/mL. A negative result does not preclude SARS-Cov-2 infection and should not be used as the sole basis for treatment or other patient management decisions. A negative result may occur with  improper specimen collection/handling, submission of specimen other than nasopharyngeal swab, presence of viral mutation(s) within the areas targeted by this assay, and inadequate number of viral copies(<138 copies/mL). A negative result must be combined with clinical observations, patient history, and epidemiological information. The expected result is Negative.  Fact Sheet for Patients:  EntrepreneurPulse.com.au  Fact Sheet for Healthcare Providers:  IncredibleEmployment.be  This test is no t yet approved or cleared by the [place] FDA and  has been authorized for detection and/or diagnosis of SARS-CoV-2 by FDA under an Emergency Use Authorization (EUA). This EUA will remain  in effect (meaning this test can be used) for the duration of the COVID-19 declaration under Section 564(b)(1) of the Act, 21 U.S.C.section 360bbb-3(b)(1), unless the authorization is terminated  or revoked sooner.       Influenza A by PCR NEGATIVE NEGATIVE Final   Influenza B by PCR NEGATIVE NEGATIVE Final    Comment: (NOTE) The Xpert Xpress SARS-CoV-2/FLU/RSV plus assay is intended as an aid in the diagnosis of influenza from Nasopharyngeal swab specimens and should not be used as a sole basis for treatment. Nasal washings and aspirates are unacceptable for Xpert Xpress SARS-CoV-2/FLU/RSV testing.  Fact Sheet for Patients: EntrepreneurPulse.com.au  Fact Sheet for Healthcare Providers: IncredibleEmployment.be  This test is not yet approved or cleared by the [place] FDA and has been authorized for detection and/or diagnosis of SARS-CoV-2 by FDA under an Emergency Use Authorization (EUA). This EUA will remain in effect (meaning this test can be used) for the duration of the COVID-19 declaration under Section 564(b)(1) of the Act, 21 U.S.C. section 360bbb-3(b)(1), unless the authorization is terminated or revoked.  Performed at [facility], [place] [address], [place], [place] [address]   Surgical pcr screen     Status: None   Collection Time: 06/26/20  8:01 AM   Specimen: Nasal Mucosa; Nasal Swab  Result Value Ref Range Status   MRSA, PCR NEGATIVE NEGATIVE Final   Staphylococcus aureus NEGATIVE NEGATIVE Final    Comment: (NOTE) The Xpert SA Assay (FDA approved for NASAL specimens in patients 71 years of age and older), is one component of a comprehensive surveillance program. It is not intended to diagnose infection nor to guide or monitor treatment. Performed at [facility], [place] [address] [place], [place] [address]   Culture, Urine     Status: None   Collection Time: 06/27/20 11:29 AM   Specimen: Urine, Clean Catch  Result Value Ref Range Status   Specimen Description URINE, CLEAN CATCH  Final   Special Requests NONE  Final   Culture   Final    NO GROWTH Performed at [facility], [place] [address] [place], [place] [address]    Report Status 06/28/2020 FINAL  Final     RN Pressure Injury Documentation:     Estimated body mass index is  18.8 kg/m as calculated from the following:   Height as of this encounter: 5\' 5"  (1.651 m).   Weight as of this encounter: 51.3 kg.  Malnutrition Type: Nutrition Problem: Increased nutrient needs Etiology: hip fracture,post-op healing Malnutrition Characteristics: Signs/Symptoms: estimated needs Nutrition Interventions: Interventions: Ensure Enlive (each supplement provides 350kcal and 20 grams of protein),MVI   Radiology Studies: No results found. Scheduled Meds: .  sodium chloride   Intravenous Once  . docusate sodium  100 mg Oral BID  . feeding supplement  237 mL Oral TID BM  . ferrous sulfate  325 mg Oral TID PC  . multivitamin with minerals  1 tablet Oral Daily  . polyethylene glycol  17 g Oral BID   Continuous Infusions: . sodium chloride 75 mL/hr at 06/27/20 1752  . cefTRIAXone (ROCEPHIN)  IV 1 g (06/29/20 2246)  . lactated ringers 10 mL/hr at 06/26/20 0835  . methocarbamol (ROBAXIN) IV      LOS: 5 days   06/28/20, DO [facility] PAGER is on AMION  If 7PM-7AM, please contact night-coverage [email or web address]

## 2020-07-01 ENCOUNTER — Inpatient Hospital Stay (HOSPITAL_COMMUNITY): Payer: Medicare Other

## 2020-07-01 DIAGNOSIS — D62 Acute posthemorrhagic anemia: Secondary | ICD-10-CM | POA: Diagnosis not present

## 2020-07-01 DIAGNOSIS — S72001A Fracture of unspecified part of neck of right femur, initial encounter for closed fracture: Secondary | ICD-10-CM | POA: Diagnosis not present

## 2020-07-01 DIAGNOSIS — N9489 Other specified conditions associated with female genital organs and menstrual cycle: Secondary | ICD-10-CM

## 2020-07-01 DIAGNOSIS — K5900 Constipation, unspecified: Secondary | ICD-10-CM

## 2020-07-01 DIAGNOSIS — N390 Urinary tract infection, site not specified: Secondary | ICD-10-CM | POA: Diagnosis not present

## 2020-07-01 DIAGNOSIS — I951 Orthostatic hypotension: Secondary | ICD-10-CM

## 2020-07-01 DIAGNOSIS — E871 Hypo-osmolality and hyponatremia: Secondary | ICD-10-CM | POA: Diagnosis not present

## 2020-07-01 DIAGNOSIS — K5641 Fecal impaction: Secondary | ICD-10-CM

## 2020-07-01 LAB — TYPE AND SCREEN
ABO/RH(D): O POS
Antibody Screen: NEGATIVE
Unit division: 0
Unit division: 0

## 2020-07-01 LAB — COMPREHENSIVE METABOLIC PANEL
ALT: 17 U/L (ref 0–44)
AST: 29 U/L (ref 15–41)
Albumin: 3.1 g/dL — ABNORMAL LOW (ref 3.5–5.0)
Alkaline Phosphatase: 57 U/L (ref 38–126)
Anion gap: 12 (ref 5–15)
BUN: 15 mg/dL (ref 8–23)
CO2: 25 mmol/L (ref 22–32)
Calcium: 9.3 mg/dL (ref 8.9–10.3)
Chloride: 90 mmol/L — ABNORMAL LOW (ref 98–111)
Creatinine, Ser: 0.83 mg/dL (ref 0.44–1.00)
GFR, Estimated: >60 mL/min (ref >60)
Glucose, Bld: 135 mg/dL — ABNORMAL HIGH (ref 70–99)
Potassium: 3.4 mmol/L — ABNORMAL LOW (ref 3.5–5.1)
Sodium: 127 mmol/L — ABNORMAL LOW (ref 135–145)
Total Bilirubin: 1.1 mg/dL (ref 0.3–1.2)
Total Protein: 6.6 g/dL (ref 6.5–8.1)

## 2020-07-01 LAB — CBC WITH DIFFERENTIAL/PLATELET
Abs Immature Granulocytes: 0.04 10*3/uL (ref 0.00–0.07)
Basophils Absolute: 0 10*3/uL (ref 0.0–0.1)
Basophils Relative: 0 %
Eosinophils Absolute: 0.1 10*3/uL (ref 0.0–0.5)
Eosinophils Relative: 1 %
HCT: 37.2 % (ref 36.0–46.0)
Hemoglobin: 13 g/dL (ref 12.0–15.0)
Immature Granulocytes: 0 %
Lymphocytes Relative: 9 %
Lymphs Abs: 0.8 10*3/uL (ref 0.7–4.0)
MCH: 30.9 pg (ref 26.0–34.0)
MCHC: 34.9 g/dL (ref 30.0–36.0)
MCV: 88.4 fL (ref 80.0–100.0)
Monocytes Absolute: 0.6 10*3/uL (ref 0.1–1.0)
Monocytes Relative: 7 %
Neutro Abs: 7.6 10*3/uL (ref 1.7–7.7)
Neutrophils Relative %: 83 %
Platelets: 418 10*3/uL — ABNORMAL HIGH (ref 150–400)
RBC: 4.21 MIL/uL (ref 3.87–5.11)
RDW: 13.7 % (ref 11.5–15.5)
WBC: 9.2 10*3/uL (ref 4.0–10.5)
nRBC: 0 % (ref 0.0–0.2)

## 2020-07-01 LAB — BPAM RBC
Blood Product Expiration Date: 202201092359
Blood Product Expiration Date: 202202072359
ISSUE DATE / TIME: 202201021659
ISSUE DATE / TIME: 202201030944
Unit Type and Rh: 5100
Unit Type and Rh: 5100

## 2020-07-01 LAB — MAGNESIUM: Magnesium: 2.2 mg/dL (ref 1.7–2.4)

## 2020-07-01 LAB — PHOSPHORUS: Phosphorus: 2.8 mg/dL (ref 2.5–4.6)

## 2020-07-01 LAB — GLUCOSE, CAPILLARY
Glucose-Capillary: 92 mg/dL (ref 70–99)
Glucose-Capillary: 101 mg/dL — ABNORMAL HIGH (ref 70–99)
Glucose-Capillary: 136 mg/dL — ABNORMAL HIGH (ref 70–99)

## 2020-07-01 MED ORDER — SODIUM CHLORIDE 0.9 % IV BOLUS
500.0000 mL | Freq: Once | INTRAVENOUS | Status: AC
  Administered 2020-07-01: 500 mL via INTRAVENOUS

## 2020-07-01 MED ORDER — POTASSIUM CHLORIDE CRYS ER 20 MEQ PO TBCR
40.0000 meq | EXTENDED_RELEASE_TABLET | Freq: Once | ORAL | Status: AC
  Administered 2020-07-01: 40 meq via ORAL
  Filled 2020-07-01: qty 2

## 2020-07-01 NOTE — Progress Notes (Signed)
Nutrition Follow-up  DOCUMENTATION CODES:   Not applicable  INTERVENTION:   -Continue Ensure Enlive po TID, each supplement provides 350 kcal and 20 grams of protein -Continue MVI with minerals daily -Magic cup TID with meals, each supplement provides 290 kcal and 9 grams of protein -Downgrade diet to dysphagia 3 (advanced mechanical soft) for ease of intake  NUTRITION DIAGNOSIS:   Increased nutrient needs related to hip fracture,post-op healing as evidenced by estimated needs.  Ongoing  GOAL:   Patient will meet greater than or equal to 90% of their needs  Progressing   MONITOR:   PO intake,Supplement acceptance,Labs,Weight trends,Skin  REASON FOR ASSESSMENT:   Consult Hip fracture protocol  ASSESSMENT:   87 year old female who presented to the ED on 12/29 after an unwitnessed fall. PMH of dementia, HLD. Pt found to have a right hip fracture.  12/30- s/p PROCEDURE:  right hip hemiarthroplasty utilizing DePuy component, size 5 standard Actis stem with a 44 unipolar ball with a +5 adapter. 1/1- s/p BSE- recommending regular diet with thin liquids  Reviewed I/O's: +3.7 L x 24 hours and +5.9 L since admission  Pt sitting in recliner chair at time of visit. She was pleasant, but responded "yeah" to most of the questions this RD asked.   Noted lunch tray in front of pt untouched. Noted meal completion 0-50%. Pt reports that she was eating and drinking well PTA, however, RD suspects this is inaccurate.   Pt currently on a soft diet, which is a surgical soft diet (low finer) designed for pts with acute/chronic GI illnesses and for those pt who recently underwent GI surgeries. Pt may better benefit from a mechanically after diet for ease of intake.   Medications reviewed and include ferrous sulfate, miralax, and senokot.   Per MD notes, plan to d/c to SNF once medically stable.   Labs reviewed: CBGS: 92-116.   NUTRITION - FOCUSED PHYSICAL EXAM:  Flowsheet Row Most  Recent Value  Orbital Region Severe depletion  Upper Arm Region Severe depletion  Thoracic and Lumbar Region Moderate depletion  Buccal Region Severe depletion  Temple Region Severe depletion  Clavicle Bone Region Severe depletion  Clavicle and Acromion Bone Region Severe depletion  Scapular Bone Region Severe depletion  Dorsal Hand Moderate depletion  Patellar Region Moderate depletion  Anterior Thigh Region Moderate depletion  Posterior Calf Region Moderate depletion  Edema (RD Assessment) Mild  Hair Reviewed  Eyes Reviewed  Mouth Reviewed  Skin Reviewed  Nails Reviewed       Diet Order:   Diet Order            DIET SOFT Room service appropriate? Yes; Fluid consistency: Thin  Diet effective now                 EDUCATION NEEDS:   Not appropriate for education at this time  Skin:  Skin Assessment: Skin Integrity Issues: Skin Integrity Issues:: Incisions Incisions: right hip  Last BM:  06/30/20  Height:   Ht Readings from Last 1 Encounters:  06/26/20 5\' 5"  (1.651 m)    Weight:   Wt Readings from Last 1 Encounters:  06/26/20 51.3 kg   BMI:  Body mass index is 18.8 kg/m.  Estimated Nutritional Needs:   Kcal:  1500-1700  Protein:  70-85 grams  Fluid:  1.5-1.7 L    06/28/20, RD, LDN, CDCES Registered Dietitian II Certified Diabetes Care and Education Specialist Please refer to [facility] for RD and/or RD on-call/weekend/after hours pager

## 2020-07-01 NOTE — Progress Notes (Signed)
PROGRESS NOTE    Suzanne Hall  YDX:[id number] DOB: 1933/04/29 DOA: 06/25/2020 PCP: Patient, No Pcp Per   Brief Narrative:  HPI per Dr. Midge Minium on 06/25/20 Suzanne Hall is a 87 y.o. female with history of dementia was brought to the ER after patient was found to have unwitnessed fall and was found on the floor.  The exact circumstances of the fall is not clear.  After the fall patient was complaining of right hip pain.  ED Course: In the ER patient's x-ray showed right hip fracture and also CT head shows frontal scalp hematoma.  Labs are significant for sodium of 128 and no old labs to compare.  WBC count 11.8 Covid testing negative EKG shows sinus tachycardia with a first-degree AV block.  Dr. Victorino Dike on-call orthopedic surgeon has been consulted and plan is to have surgery in the morning.  **Interim History Patient underwent a right hip hemiarthroplasty utilizing the DePuy component By Dr. Charlann Boxer on 06/26/20. She came back to the floor and she is doing okay but does complain of significant hip pain.  Patient sundowns and had to be restrained initially but was removed and now reinitiated as she is a danger to fall.    Sodium and WBC improving.  Also her hemoglobin dropped 6 g since the day before and could be a postsurgical drop with a partial dilutional component as she is getting IV fluid hydration but will need to rule out GI Bleeding and Retroperitoneal Hematoma.  Will reduce IV fluids to 75 mL's per hour for now.  She was typed and screened and transfused 2 units of PRBCs and will get another additional 1 unit today given that her hemoglobin remained lower and was 7.7 this a.m.  Continue to monitor for further signs and symptoms of bleeding.  TOC will be consulted given that PT recommending SNF.  Patient also appears to have a urinary tract infection however no urine cultures were obtained despite being ordered and being initiated on antibiotics.  Urine cultures finally resulted  and of no surprise it showed no growth as she had already been placed on antibiotics.  She continues to be extremely confused and does sundown quite a bit and she is a danger to herself from the fall given her acute issues so she was placed in restraints yesterday but will remove them today as she appears more calm.  CT of the abdomen pelvis was ordered was finally done and did show a 9.7 cm deep subcutaneous hematoma laterally in the right hip as well as a 4.6 cm left cystic adnexal process that could not be adequately characterized.  Will obtain a pelvic ultrasound for this.  She also was found to have descending and sigmoid diverticulosis and fecal dilatation of the rectum up to 7.8 cm in diameter.  She was also noted to have a T12 compression fracture deformity as well as a 1.2 cm low-tension mid left renal lesion possibly a cyst but it could be incompletely characterized.  We will give her a bowel regimen for her fecal dilatation and continue to monitor her blood count closely.  This morning she was extremely orthostatic and dropped her blood pressure so placed TED hose on her and give her 500 mL bolus and started back on maintenance IV fluid.  We will also obtain a pelvic ultrasound given her pelvic adnexal lesion.  Will need to continue PT OT efforts and ensure that she is not orthostatic prior to discharging to skilled nursing facility.  Assessment & Plan:   Principal Problem:   Closed right hip fracture (HCC) Active Problems:   Hyponatremia   Dementia (HCC)   Hip fracture (HCC)  Right hip fracture unwitnessed fall.   -Orthopedic surgery was consulted and she kept n.p.o. after midnight for surgical intervention -She underwent a right hip hemiarthroplasty today done by Dr. Horton FinerIllness- -continue pain control with acetaminophen, fentanyl was given IV once, IV morphine, p.o. tramadol 50 to 100 mg every 6 hours for severe pain, methocarbamol and bowel regimen with bisacodyl 10 mg rectally as needed  for moderate constipation and also is on MiraLAX 17 g p.o. twice daily; will adjust bowel regimen given her constipation -Head CT done and showed "Small right frontal scalp hematoma. No acute intracranial process." -VTE prophylaxis per orthopedic surgery and they are recommending aspirin 81 mg p.o. twice daily GI bleed is suspected -Antiemetics with ondansetron and metoclopramide if ineffective -we will need PT OT evaluation for further disposition planning and they are recommending SNF -Further care per orthopedic surgery and they are recommending advancing her diet, up with therapy and weightbearing as tolerated she is a danger to herself given her significant confusion  Hyponatremia, was slowly improving -Cause not clear.  Will trend metabolic panel check urine studies including urine sodium osmolality check TSH which was 3.38 and cortisol level was 29.2.  -Sodium level is slowly improving and went from 126 -> 129 -> 131 and has now trended down to 130 yesterday and today is 127 -Was Currently getting NS at 100 mL/hr and will reduce to 75 mL/hr. may need to stop hydration altogether but should be getting a 500 mL bolus given her orthostatic hypotension -She may have a component of SIADH if she continues to drop her sodium may need nephrology involvement -Repeat CMP in the AM   Constipation and fecal dilatation of the rectum -CT scan of the abdomen pelvis showed "Descending and sigmoid diverticulosis. Fecal dilatation of the rectum up to 7.8 cm diameter." -We will initiate and adjust bowel regimen and started her on senna docusate 1 tab p.o. twice daily, MiraLAX 17 g p.o. twice daily, total 10 mg rectally suppository as needed and if necessary will give her a Fleet enema or a smog enema  Leukocytosis in the setting of Fall and UTI -In the setting of her fall and likely urinary tract infection -Patient WBC was 11.8 -> 14.9 and now has worsened to 20.3 and is now 13.4 -> 16.2 now trending down to  10.6 the day before yesterday and yesterday was 6.9 and today is 9.2 -Urinalysis done and showed a hazy appearance with moderate hemoglobin, large leukocytes, positive nitrites, many bacteria, 0-5 squamous epithelial cells, greater than 50 WBCs  -Check urine culture unfortunately she received preoperative antibiotics with cefazolin and unsure whether her urine culture will be of any value now -Urine Cx Sent and showed no Growth -Empirically start her on IV ceftriaxone but unfortunately urine culture no growth to date and can stop antibiotics  Elevated blood pressure readings  -we will keep patient on as needed IV hydralazine.  Follow blood pressure trends. -Last blood pressure reading was not done as patient refused Vital Signs -Continue to monitor blood pressures per protocol -Last blood pressure was 148/63  Hypokalemia -Patient's K+ this AM was 3.4 -Mag Level was 2.2 -Replete with po KCl 40 mEQ x1 -Continue to Monitor and Replete as Necessary -Repeat CMP in the AM   Pelvic Adnexal Lesion -CT Scan of the Abd/Pelvis showed "Uterus unremarkable.  4.6 cm hypodense appearing left cystic adnexal process." This was not adequately characterized on CT Scan  -Obtain U/S TransAbdominal Pelvis U/S  Near Syncope and Orthostatic Hypotension -In the setting of likely orthostatic hypotension from acute blood loss anemia -We will type and screen and transfuse 3 units of PRBCs -We will need orthostatic vital signs done and she was significantly orthostatic today with PT OT; patient blood pressure dropped from 132/53 lying, to 126/64 sitting, and then to 88/70 on standing with significant anxiety and lightheadedness -She is currently getting IV fluids normal saline at 75 MLS per hour; will give her a bolus of 500 mL and order TED hose -If continues to persists after blood loss has been addressed we will obtain echocardiogram and troponins and further imaging -Will try TED Hose and Bolus; May need  Abdominal Binder   Acute Blood Loss Anemia -Patient's Hgb/Hct went from 12.6/36.9 -> 13.0/39.6 -> 9.0/26.7 -> 7.3/22. -> 7.9/24.0 -> 6.6/19.8 and yesterday it is 7.7/23.3; after her blood was transfused repeat was 11.5/33.3 and today it is 13.0/37.2 -She was Typed and Screened and transfuse 2 units of PRBCs on 06/29/2020; will transfuse 1 on 06/30/2021 -Check Anemia Panel and showed an iron level of 12, U IBC 223, TIBC of 235, saturation ratios of 5%, ferritin level of 134, folate level 8.8, vitamin B12 257 -Could be partly dilutional drop but now likely post-surgical drop -We will obtain FOBT given her drop in hemoglobin and obtain a CT of the abdomen pelvis rule out retroperitoneal hematoma; CT of the abdomen pelvis showed a 9.7 cm deep subcutaneous hematoma laterally in the hip -Continue with ferrous sulfate 325 mg p.o. 3 times daily with meals per orthopedic recommendations and will hold her aspirin for now -Continue to Monitor for S/Sx of bleeding; Currently no overt bleeding noted -Repeat CBC in the AM   History of Dementia. -Placed on delirium precautions; was agitated this morning -Patient Sundowns at night and has been very confused and will need safety sitter and soft mitten restraints as well as a belt restraint if she continues to sundown; soft restraints were removed this morning.  Thrombocytosis -Likely Reactive -Patient's Platelet Count went from 256 -> 418 -Continue to Monitor and Trend -Repeat CBC in the AM  Scalp Hematoma  -seen in the CAT scan.  Hyperglycemia  -Patient's blood sugar ranging from 96 -132 on daily BMPs  -CBG's ranging from 89-1 01 -Hemoglobin A1c was checked and was 5.4 -Continue to monitor blood sugars carefully and if necessary will place on sensitive NovoLog sign scale insulin AC  Hypophosphatemia -Patient's phosphorus level was 1.5 and is improved to 3.0 after repletion -Replete with p.o. Phos-NAK 1 packet p.o. 3 times daily with meals and bedtime  given that there is a Sport and exercise psychologistnational shortage of IV sodium phosphate yesterday and now will stop D5 and continue to monitor and replete as necessary -Repeat phosphorus level in the a.m.  DVT prophylaxis: SCDs, Aspirin 81 mg p.o. twice daily but may need to hold if she is having GI bleeding Code Status: FULL CODE Family Communication: No family present at bedside Disposition Plan: Pending improvement and work-up of her acute blood loss anemia as well as Ortho clearance and evaluation by PT OT; PT Recommending SNF so will get Social worker to assist with placement and currently still working her up for her acute drop in anemia  Status is: Inpatient  Remains inpatient appropriate because:Unsafe d/c plan, IV treatments appropriate due to intensity of illness or inability to take  PO and Inpatient level of care appropriate due to severity of illness   Dispo: The patient is from: Home              Anticipated d/c is to: TBD              Anticipated d/c date is: 2 days              Patient currently is not medically stable to d/c.  Consultants:   Orthopedic Surgery   Procedures:  PROCEDURE:  right hip hemiarthroplasty utilizing DePuy component, size 5 standard Actis stem with a 44 unipolar ball with a +5 adapter  Antimicrobials:  Anti-infectives (From admission, onward)   Start     Dose/Rate Route Frequency Ordered Stop   06/26/20 1900  cefTRIAXone (ROCEPHIN) 1 g in sodium chloride 0.9 % 100 mL IVPB        1 g 200 mL/hr over 30 Minutes Intravenous Every 24 hours 06/26/20 1809     06/26/20 1430  ceFAZolin (ANCEF) IVPB 2g/100 mL premix  Status:  Discontinued        2 g 200 mL/hr over 30 Minutes Intravenous Every 6 hours 06/26/20 1134 06/26/20 1811   06/26/20 0845  ceFAZolin (ANCEF) IVPB 2g/100 mL premix        2 g 200 mL/hr over 30 Minutes Intravenous On call to O.R. 06/26/20 0756 06/26/20 0916       Subjective: Seen and examined this morning and she was sitting up in the chair bedside in  no acute distress.  She is more pleasant today and calmer.  Denies any hip pain.  No nausea or vomiting.  When she started working with physical therapy she became extremely orthostatic and dropped her blood pressure.  We will order TED hose for her.  She denies any other concerns or points at this time.  She remains pleasantly confused.  Will discharge to SNF when she is medically stable and ensure that she is not orthostatic, has a bowel movement, and make sure that she is not bleeding.  Objective: Vitals:   06/30/20 1245 06/30/20 1502 07/01/20 0448 07/01/20 0819  BP: (!) 132/56 (!) 134/53 (!) 148/65 (!) 148/63  Pulse: 68 66 72 75  Resp: 17 14 15 17   Temp: 98.6 F (37 C) 97.7 F (36.5 C) 98.2 F (36.8 C) 98.2 F (36.8 C)  TempSrc: Axillary Oral Oral Oral  SpO2: 99% 95% 98% 91%  Weight:      Height:        Intake/Output Summary (Last 24 hours) at 07/01/2020 1443 Last data filed at 07/01/2020 0845 Gross per 24 hour  Intake 3144.8 ml  Output 1 ml  Net 3143.8 ml   Filed Weights   06/25/20 1940 06/26/20 0819  Weight: 51.3 kg 51.3 kg   Examination: Physical Exam:  Constitutional: WN/WD elderly Caucasian female who is more awake and alert and sitting in the chair at bedside and pleasantly confused and appears to be in no acute distress. Eyes: Lids and conjunctivae normal, sclerae anicteric  ENMT: External Ears, Nose appear normal. Grossly normal hearing.  Neck: Appears normal, supple, no cervical masses, normal ROM, no appreciable thyromegaly; no JVD Respiratory: Diminished to auscultation bilaterally, no wheezing, rales, rhonchi or crackles. Normal respiratory effort and patient is not tachypenic. No accessory muscle use.  Unlabored breathing Cardiovascular: RRR, no murmurs / rubs / gallops. S1 and S2 auscultated.  No appreciable extremity edema noted. Abdomen: Soft, non-tender, non-distended. Bowel sounds positive.  GU: Deferred.  Musculoskeletal: No clubbing / cyanosis of  digits/nails. No joint deformity upper and lower extremities.  Skin: Right hip is bruised and she has a Mepilex pad where her surgical incisions are.. No induration; Warm and dry.  Neurologic: CN 2-12 grossly intact with no focal deficits.  Romberg sign and cerebellar reflexes were not assessed Psychiatric: Impaired judgment and insight. Alert and oriented x 2.  Pleasantly demented and has a normal mood and appropriate affect today.   Data Reviewed: I have personally reviewed following labs and imaging studies  CBC: Recent Labs  Lab 06/28/20 0317 06/28/20 0928 06/29/20 0149 06/30/20 0449 06/30/20 1804 07/01/20 0913  WBC 13.4* 16.2* 10.6* 6.8  --  9.2  NEUTROABS 10.8* 13.6* 7.6 4.4  --  7.6  HGB 7.3* 7.9* 6.6* 7.7* 11.5* 13.0  HCT 22.0* 24.0* 19.8* 23.3* 33.3* 37.2  MCV 91.7 92.0 91.7 90.7  --  88.4  PLT 191 249 218 256  --  418*   Basic Metabolic Panel: Recent Labs  Lab 06/27/20 0316 06/28/20 0317 06/29/20 0149 06/30/20 0449 07/01/20 0913  NA 129* 131* 131* 130* 127*  K 4.0 4.4 4.5 4.3 3.4*  CL 96* 103 103 96* 90*  CO2 22 22 22 25 25   GLUCOSE 132* 116* 96 96 135*  BUN 24* 34* 31* 18 15  CREATININE 0.91 0.89 0.73 0.76 0.83  CALCIUM 8.4* 8.2* 8.0* 8.2* 9.3  MG 2.0 2.3 2.1 2.0 2.2  PHOS 3.9 2.5 1.5* 3.0 2.8   GFR: Estimated Creatinine Clearance: 40.1 mL/min (by C-G formula based on SCr of 0.83 mg/dL). Liver Function Tests: Recent Labs  Lab 06/27/20 0316 06/28/20 0317 06/29/20 0149 06/30/20 0449 07/01/20 0913  AST 27 23 31 24 29   ALT 16 12 17 15 17   ALKPHOS 42 35* 38 39 57  BILITOT 0.5 0.2* 0.9 0.6 1.1  PROT 5.5* 5.0* 4.5* 4.8* 6.6  ALBUMIN 3.1* 2.6* 2.3* 2.3* 3.1*   No results for input(s): LIPASE, AMYLASE in the last 168 hours. No results for input(s): AMMONIA in the last 168 hours. Coagulation Profile: No results for input(s): INR, PROTIME in the last 168 hours. Cardiac Enzymes: No results for input(s): CKTOTAL, CKMB, CKMBINDEX, TROPONINI in the last 168  hours. BNP (last 3 results) No results for input(s): PROBNP in the last 8760 hours. HbA1C: No results for input(s): HGBA1C in the last 72 hours. CBG: Recent Labs  Lab 06/30/20 0030 06/30/20 1124 06/30/20 1838 07/01/20 0442 07/01/20 1117  GLUCAP 89 94 116* 92 101*   Lipid Profile: No results for input(s): CHOL, HDL, LDLCALC, TRIG, CHOLHDL, LDLDIRECT in the last 72 hours. Thyroid Function Tests: No results for input(s): TSH, T4TOTAL, FREET4, T3FREE, THYROIDAB in the last 72 hours. Anemia Panel: No results for input(s): VITAMINB12, FOLATE, FERRITIN, TIBC, IRON, RETICCTPCT in the last 72 hours. Sepsis Labs: No results for input(s): PROCALCITON, LATICACIDVEN in the last 168 hours.  Recent Results (from the past 240 hour(s))  Resp Panel by RT-PCR (Flu A&B, Covid) Nasopharyngeal Swab     Status: None   Collection Time: 06/25/20  8:18 PM   Specimen: Nasopharyngeal Swab; Nasopharyngeal(NP) swabs in vial transport medium  Result Value Ref Range Status   SARS Coronavirus 2 by RT PCR NEGATIVE NEGATIVE Final    Comment: (NOTE) SARS-CoV-2 target nucleic acids are NOT DETECTED.  The SARS-CoV-2 RNA is generally detectable in upper respiratory specimens during the acute phase of infection. The lowest concentration of SARS-CoV-2 viral copies this assay can detect is 138 copies/mL. A negative  result does not preclude SARS-Cov-2 infection and should not be used as the sole basis for treatment or other patient management decisions. A negative result may occur with  improper specimen collection/handling, submission of specimen other than nasopharyngeal swab, presence of viral mutation(s) within the areas targeted by this assay, and inadequate number of viral copies(<138 copies/mL). A negative result must be combined with clinical observations, patient history, and epidemiological information. The expected result is Negative.  Fact Sheet for Patients:   BloggerCourse.com  Fact Sheet for Healthcare Providers:  SeriousBroker.it  This test is no t yet approved or cleared by the [place] FDA and  has been authorized for detection and/or diagnosis of SARS-CoV-2 by FDA under an Emergency Use Authorization (EUA). This EUA will remain  in effect (meaning this test can be used) for the duration of the COVID-19 declaration under Section 564(b)(1) of the Act, 21 U.S.C.section 360bbb-3(b)(1), unless the authorization is terminated  or revoked sooner.       Influenza A by PCR NEGATIVE NEGATIVE Final   Influenza B by PCR NEGATIVE NEGATIVE Final    Comment: (NOTE) The Xpert Xpress SARS-CoV-2/FLU/RSV plus assay is intended as an aid in the diagnosis of influenza from Nasopharyngeal swab specimens and should not be used as a sole basis for treatment. Nasal washings and aspirates are unacceptable for Xpert Xpress SARS-CoV-2/FLU/RSV testing.  Fact Sheet for Patients: BloggerCourse.com  Fact Sheet for Healthcare Providers: SeriousBroker.it  This test is not yet approved or cleared by the [place] FDA and has been authorized for detection and/or diagnosis of SARS-CoV-2 by FDA under an Emergency Use Authorization (EUA). This EUA will remain in effect (meaning this test can be used) for the duration of the COVID-19 declaration under Section 564(b)(1) of the Act, 21 U.S.C. section 360bbb-3(b)(1), unless the authorization is terminated or revoked.  Performed at [facility], [address], [place], [place] [address]   Surgical pcr screen     Status: None   Collection Time: 06/26/20  8:01 AM   Specimen: Nasal Mucosa; Nasal Swab  Result Value Ref Range Status   MRSA, PCR NEGATIVE NEGATIVE Final   Staphylococcus aureus NEGATIVE NEGATIVE Final    Comment: (NOTE) The Xpert SA Assay (FDA approved for NASAL specimens in patients  51 years of age and older), is one component of a comprehensive surveillance program. It is not intended to diagnose infection nor to guide or monitor treatment. Performed at [facility], [address] [place], [place] [address]   Culture, Urine     Status: None   Collection Time: 06/27/20 11:29 AM   Specimen: Urine, Clean Catch  Result Value Ref Range Status   Specimen Description URINE, CLEAN CATCH  Final   Special Requests NONE  Final   Culture   Final    NO GROWTH Performed at [facility], [address] [place], [place] [address]    Report Status 06/28/2020 FINAL  Final     RN Pressure Injury Documentation:     Estimated body mass index is 18.8 kg/m as calculated from the following:   Height as of this encounter: 5\' 5"  (1.651 m).   Weight as of this encounter: 51.3 kg.  Malnutrition Type: Nutrition Problem: Increased nutrient needs Etiology: hip fracture,post-op healing Malnutrition Characteristics: Signs/Symptoms: estimated needs Nutrition Interventions: Interventions: Ensure Enlive (each supplement provides 350kcal and 20 grams of protein),MVI   Radiology Studies: CT ABDOMEN PELVIS WO CONTRAST  Result Date: 06/30/2020 CLINICAL DATA:  Acute blood loss anemia EXAM: CT ABDOMEN AND PELVIS WITHOUT CONTRAST TECHNIQUE: Multidetector CT imaging of the abdomen and pelvis was performed following the standard protocol without IV contrast. COMPARISON:  None available FINDINGS: Lower chest: No pleural effusion.  Small volume pericardial fluid. Hepatobiliary: No focal liver abnormality is seen. No gallstones, gallbladder wall thickening, or biliary dilatation. Pancreas: Unremarkable. No pancreatic ductal dilatation or surrounding inflammatory changes. Spleen: Normal in size without focal abnormality. Adrenals/Urinary Tract: Adrenal glands unremarkable. 1.2 cm low-attenuation mid left renal lesion possibly cyst but incompletely characterized. Prominent bilateral  extrarenal collecting systems. The urinary bladder is distended. Stomach/Bowel: Stomach decompressed. Small bowel nondilated. Normal appendix. The colon is physiologically distended proximally. Multiple descending and sigmoid diverticula without significant adjacent inflammatory change. Fecal dilatation of the rectum up to 7.8 cm diameter. Vascular/Lymphatic: Coarse aortoiliac arterial calcifications without aneurysm. No abdominopelvic adenopathy. Reproductive: Uterus unremarkable. 4.6 cm hypodense appearing left cystic adnexal process. Other: No ascites.  No free air.  No retroperitoneal hematoma. Musculoskeletal: Lumbar dextroscoliosis with multilevel spondylitic change. T12 compression fracture deformity with approximately 40% loss of height anteriorly, age indeterminate. Left hip arthroplasty components project in expected location. The distal aspect of the femoral component is not visualized. 9.7 cm hematoma laterally in the deep subcutaneous tissues of the right hip. IMPRESSION: 1. 9.7 cm deep subcutaneous hematoma laterally in the right hip. 2. 4.6 cm left cystic adnexal process, not adequately characterized. Recommend pelvic ultrasound for further evaluation. 3. Descending and sigmoid diverticulosis. 4. Fecal dilatation of the rectum up to 7.8 cm diameter. 5. T12 compression fracture deformity, age indeterminate. 6. 1.2 cm low-attenuation mid left renal lesion possibly cyst but incompletely characterized. Aortic Atherosclerosis (ICD10-I70.0). Electronically Signed   By: Corlis Leak M.D.   On: 06/30/2020 16:02   Scheduled Meds: . sodium chloride   Intravenous Once  . feeding supplement  237 mL Oral TID BM  . ferrous sulfate  325 mg Oral TID PC  . multivitamin with minerals  1 tablet Oral Daily  . polyethylene glycol  17 g Oral BID  . senna-docusate  1 tablet Oral BID   Continuous Infusions: . sodium chloride 75 mL/hr at 06/27/20 1752  . cefTRIAXone (ROCEPHIN)  IV 1 g (06/30/20 1831)  . lactated  ringers 10 mL/hr at 06/26/20 0835  . methocarbamol (ROBAXIN) IV    . sodium chloride      LOS: 6 days   Merlene Laughter, DO [facility] PAGER is on AMION  If 7PM-7AM, please contact night-coverage [email or web address]

## 2020-07-01 NOTE — Care Management Important Message (Signed)
Important Message  Patient Details  Name: Suzanne Hall MRN: [id number] Date of Birth: 1933/05/23   Medicare Important Message Given:  Yes     Tetsuo Coppola P Blaike Vickers 07/01/2020, 2:50 PM

## 2020-07-01 NOTE — Plan of Care (Signed)
Patient refused to have blood drawn by phlebotomy this morning. Attempted twice so far.

## 2020-07-01 NOTE — Progress Notes (Signed)
Physical Therapy Treatment Patient Details Name: Suzanne Hall MRN: [id number] DOB: 24-Jul-1932 Today's Date: 07/01/2020    History of Present Illness The pt is an 87 yo female presenting with R hip fx after an unwitnessed fall, and is now s/p R THA. CT of head also revealed frontal hematoma. PMH includes: CKD, dementia, HLD, and osteopenia.    PT Comments    Continuing work on functional mobility and activity tolerance;  Lots of encouragement to participate, and noted pt was incontinent of stool with the first sti to stand; Noted pt had limited standing tolerance, and opted to obtain orthostatic BPs which were positive for a significant SBP drop ( see general comments); Dr. Marland Mcalpine and Marcelino Duster, RN aware  Follow Up Recommendations  SNF;Supervision/Assistance - 24 hour     Equipment Recommendations  Rolling walker with 5" wheels;3in1 (PT)    Recommendations for Other Services       Precautions / Restrictions Precautions Precautions: Posterior Hip;Fall Restrictions RLE Weight Bearing: Weight bearing as tolerated    Mobility  Bed Mobility Overal bed mobility: Needs Assistance Bed Mobility: Supine to Sit     Supine to sit: Min assist;Mod assist     General bed mobility comments: Mod assist to elevate trunk; Cues and encouragement ot participate  Transfers Overall transfer level: Needs assistance Equipment used: Rolling walker (2 wheeled) Transfers: Sit to/from [employer] Sit to Stand: Min assist Stand pivot transfers: Min assist (second person helpful for monitoring Post Hip Prec)       General transfer comment: Lots of encouragement to participate; Mod assist for initial stand, and pt was incontinent of stool, assisted to [facility]; stood from [facility] with min asssit and pivoted to recliner; stood from recliner to obtain orthostaticsa  Ambulation/Gait             General Gait Details: Pt unable due to BP drop in standing   Stairs              Wheelchair Mobility    Modified Rankin (Stroke Patients Only)       Balance     Sitting balance-Leahy Scale: Poor       Standing balance-Leahy Scale: Poor                              Cognition Arousal/Alertness: Awake/alert Behavior During Therapy: WFL for tasks assessed/performed Overall Cognitive Status: History of cognitive impairments - at baseline                                 General Comments: pt with dementia at baseline, noted STM deficits through session as pt unable to remember any precautions despite repeated simple instructions. Pt seems to forget conversation after ~5 minutes, asking son same questions multiple times through session      Exercises      General Comments General comments (skin integrity, edema, etc.):   07/01/20 1107  Orthostatic Lying   BP- Lying 132/53  Pulse- Lying 78  Orthostatic Sitting  BP- Sitting 126/64  Pulse- Sitting 83  Orthostatic Standing at 0 minutes  BP- Standing at 0 minutes (!) 88/70  Pulse- Standing at 0 minutes 90        Pertinent Vitals/Pain Pain Assessment: Faces Faces Pain Scale: No hurt Pain Location: R hip with movement Pain Descriptors / Indicators: Grimacing;Discomfort;Moaning Pain Intervention(s): Monitored during session    Home  Living                      Prior Function            PT Goals (current goals can now be found in the care plan section) Acute Rehab PT Goals Patient Stated Goal: to walk PT Goal Formulation: With patient Time For Goal Achievement: 07/11/20 Potential to Achieve Goals: Good Progress towards PT goals: Progressing toward goals    Frequency    Min 2X/week      PT Plan Current plan remains appropriate    Co-evaluation              AM-PAC PT "6 Clicks" Mobility   Outcome Measure  Help needed turning from your back to your side while in a flat bed without using bedrails?: A Lot Help needed moving from lying on your  back to sitting on the side of a flat bed without using bedrails?: A Lot Help needed moving to and from a bed to a chair (including a wheelchair)?: A Lot Help needed standing up from a chair using your arms (e.g., wheelchair or bedside chair)?: A Lot Help needed to walk in hospital room?: A Little Help needed climbing 3-5 steps with a railing? : A Lot 6 Click Score: 13    End of Session Equipment Utilized During Treatment: Gait belt Activity Tolerance: Other (comment) (Limited by orthostatic hypotension) Patient left: in chair;with call bell/phone within reach;with chair alarm set Nurse Communication: Mobility status PT Visit Diagnosis: Unsteadiness on feet (R26.81);Other abnormalities of gait and mobility (R26.89);Muscle weakness (generalized) (M62.81);Pain Pain - Right/Left: Right Pain - part of body: Hip     Time: 1102-1130 PT Time Calculation (min) (ACUTE ONLY): 28 min  Charges:  $Therapeutic Activity: 23-37 mins                     Van Clines, PT  Acute Rehabilitation Services Pager [phone] Office [phone]    Suzanne Hall 07/01/2020, 5:25 PM

## 2020-07-01 NOTE — TOC Initial Note (Signed)
Transition of Care [facility]) - Initial/Assessment Note    Patient Details  Name: Suzanne Hall MRN: [id number] Date of Birth: 1933/02/28  Transition of [facility]) CM/SW Contact:    Cristobal Goldmann, LCSW Phone Number: 07/01/2020, 6:33 PM  Clinical Narrative:  Visited with patient to discuss her discharge plan and recommendation of ST rehab. Ms. Huntoon asked that CSW contact her son. Call made while in room to son Annette Stable [phone]) and informed him of PT/OT recommendations of ST rehab. When asked,son responded that his mother has not been to ST rehab before. CSW advised that patient is a resident at [facility] in the memory care unit. Rehab and the facility search process explained and son informed about [email or web address]     Per son, his preferences are (in ordr): [facility], [facility], [facility], Facilities manager                Expected Discharge Plan: Skilled Nursing Facility Barriers to Discharge: Other (comment) (PASRR number, facility selection, insurance authorization)   Patient Goals and CMS Choice Patient states their goals for this hospitalization and ongoing recovery are:: Patient and son agreeable to ST rehab CMS Medicare.gov Compare Post Acute Care list provided to:: Patient Represenative (must comment) (Son informed regarding Medicare.gov for SNF information) Choice offered to / list presented to : Adult Children  Expected Discharge Plan and Services Expected Discharge Plan: Skilled Nursing Facility In-house Referral: Clinical Social Work     Living arrangements for the past 2 months: Assisted Living Facility ([facility])                                     Prior Living Arrangements/Services Living arrangements for the past 2 months: Assisted Living Facility ([facility]) Lives with:: Facility Resident Patient language and need for interpreter reviewed:: No Do you feel safe going back to the place where you live?: No   Son in  agreement with ST rehab  Need for Family Participation in Patient Care: Yes (Comment) Care giver support system in place?: Yes (comment)   Criminal Activity/Legal Involvement Pertinent to Current Situation/Hospitalization: No - Comment as needed  Activities of Daily Living Home Assistive Devices/Equipment: Eyeglasses ADL Screening (condition at time of admission) Patient's cognitive ability adequate to safely complete daily activities?: No Is the patient deaf or have difficulty hearing?: No Does the patient have difficulty seeing, even when wearing glasses/contacts?: No Does the patient have difficulty concentrating, remembering, or making decisions?: Yes Patient able to express need for assistance with ADLs?: Yes Does the patient have difficulty dressing or bathing?: Yes Independently performs ADLs?: No Communication: Independent Dressing (OT): Needs assistance Grooming: Independent Feeding: Independent Bathing: Needs assistance Toileting: Needs assistance In/Out Bed: Needs assistance Walks in Home: Independent Does the patient have difficulty walking or climbing stairs?: No Weakness of Legs: Right Weakness of Arms/Hands: None  Permission Sought/Granted Permission sought to share information with : Family Supports Permission granted to share information with : Yes, Verbal Permission Granted  Share Information with NAME: Delena Casebeer     Permission granted to share info w Relationship: Son  Permission granted to share info w Contact Information: [phone]  Emotional Assessment Appearance:: Appears stated age Attitude/Demeanor/Rapport: Other (comment) (appropriate) Affect (typically observed): Pleasant Orientation: : Oriented to Self Alcohol / Substance Use: Tobacco Use,Alcohol Use,Illicit Drugs (Per H&P. patient does not smoke, drink or use illicit drugs) Psych Involvement: No (comment)  Admission diagnosis:  Hip fracture (HCC) [S72.009A] Fall [W19.XXXA] Closed right  hip fracture (HCC) [S72.001A] Closed fracture of right hip, initial encounter [facility]) [S72.001A] Patient Active Problem List   Diagnosis Date Noted  . Closed right hip fracture (HCC) 06/25/2020  . Hyponatremia 06/25/2020  . Dementia (HCC) 06/25/2020  . Hip fracture (HCC) 06/25/2020   PCP:  Patient, No Pcp Per Pharmacy:  No Pharmacies Listed    Social Determinants of Health (SDOH) Interventions  No SDOH interventions requested or needed at this tim   Readmission Risk Interventions No flowsheet data found.

## 2020-07-01 NOTE — Plan of Care (Signed)

## 2020-07-01 NOTE — NC FL2 (Signed)
[place] MEDICAID FL2 LEVEL OF CARE SCREENING TOOL     IDENTIFICATION  Patient Name: Suzanne Hall Birthdate: 1932-12-14 Sex: female Admission Date (Current Location): 06/25/2020  [facility] and [place] Number:  Producer, television/film/video and Address:  [facility], [address], [place], [place] [address]      Provider Number: [id number]  Attending Physician Name and Address:  Merlene Laughter, DO  Relative Name and Phone Number:  Nevae Pinnix - son - [phone]    Current Level of Care: Hospital Recommended Level of Care: Skilled Nursing Facility Prior Approval Number:    Date Approved/Denied:   PASRR [place]:  ([place] PAS[place] 1/4 - MUST CX#[id number])  Discharge Plan: SNF    Current Diagnoses: Patient Active Problem List   Diagnosis Date Noted  . Closed right hip fracture (HCC) 06/25/2020  . Hyponatremia 06/25/2020  . Dementia (HCC) 06/25/2020  . Hip fracture (HCC) 06/25/2020    Orientation RESPIRATION BLADDER Height & Weight     Self  Normal Incontinent Weight: 113 lb (51.3 kg) Height:  5\' 5"  (165.1 cm)  BEHAVIORAL SYMPTOMS/MOOD NEUROLOGICAL BOWEL NUTRITION STATUS      Continent Diet (Regular)  AMBULATORY STATUS COMMUNICATION OF NEEDS Skin   Total Care (Patient was unable to ambulate with PT during evaluation on 12/31) Verbally Other (Comment) (Incision hip with hydrocolloid dressing; Ecchymosis right hip)                       Personal Care Assistance Level of Assistance  Bathing,Feeding,Dressing Bathing Assistance: Maximum assistance Feeding assistance: Limited assistance (Assistance with set-up) Dressing Assistance: Maximum assistance Total Care Assistance: Maximum assistance   Functional Limitations Info  Sight,Hearing,Speech Sight Info: Adequate Hearing Info: Adequate Speech Info: Adequate    SPECIAL CARE FACTORS FREQUENCY  PT (By licensed PT),OT (By licensed OT),Speech therapy     PT Frequency: Evaluation  12/31. PT at SNF eval and treat, a minimum of 5 days per week OT Frequency: Evaluation 06/29/20. OT at SNF eval and treat, a minimum of 5 days per week     Speech Therapy Frequency: Evaluation 06/28/20      Contractures Contractures Info: Not present    Additional Factors Info  Code Status,Allergies Code Status Info: Full Allergies Info: No known allergies           Current Medications (07/01/2020):  This is the current hospital active medication list Current Facility-Administered Medications  Medication Dose Route Frequency Provider Last Rate Last Admin  . 0.9 %  sodium chloride infusion (Manually program via Guardrails IV Fluids)   Intravenous Once 08/29/2020 T, NP      . 0.9 %  sodium chloride infusion   Intravenous Continuous Audrea Muscat Calzada, DO 75 mL/hr at 06/27/20 1752 Infusion Verify at 06/27/20 1752  . acetaminophen (TYLENOL) tablet 325-650 mg  325-650 mg Oral Q6H PRN 06/29/20, PA-C   650 mg at 06/30/20 1322  . alum & mag hydroxide-simeth (MAALOX/MYLANTA) 200-200-20 MG/5ML suspension 15 mL  15 mL Oral Q4H PRN 01-04-2001, PA-C      . bisacodyl (DULCOLAX) suppository 10 mg  10 mg Rectal Daily PRN Tommie Ard, PA-C   10 mg at 06/30/20 1637  . cefTRIAXone (ROCEPHIN) 1 g in sodium chloride 0.9 % 100 mL IVPB  1 g Intravenous Q24H 08/28/20 Carrizo Hill, DO 200 mL/hr at 06/30/20 1831 1 g at 06/30/20 1831  . diphenhydrAMINE (BENADRYL) 12.5 MG/5ML elixir 12.5-25 mg  12.5-25 mg Oral Q4H PRN Tommie Ard, PA-C      . feeding supplement (ENSURE ENLIVE / ENSURE PLUS) liquid 237 mL  237 mL Oral TID BM Sheikh, Omair Latif, DO   237 mL at 07/01/20 1318  . ferrous sulfate tablet 325 mg  325 mg Oral TID PC Tommie Ard, PA-C   325 mg at 07/01/20 1316  . hydrALAZINE (APRESOLINE) injection 5 mg  5 mg Intravenous Q4H PRN Tommie Ard, PA-C      . lactated ringers infusion   Intravenous Continuous Achille Rich, MD 10 mL/hr at 06/26/20 4259 Continued from Pre-op  at 06/26/20 0835  . magnesium citrate solution 1 Bottle  1 Bottle Oral Once PRN Tommie Ard, PA-C      . menthol-cetylpyridinium (CEPACOL) lozenge 3 mg  1 lozenge Oral PRN Dennie Bible R, PA-C       Or  . phenol (CHLORASEPTIC) mouth spray 1 spray  1 spray Mouth/Throat PRN Tommie Ard, PA-C      . methocarbamol (ROBAXIN) tablet 500 mg  500 mg Oral Q6H PRN Tommie Ard, PA-C       Or  . methocarbamol (ROBAXIN) 500 mg in dextrose 5 % 50 mL IVPB  500 mg Intravenous Q6H PRN Tommie Ard, PA-C      . metoCLOPramide (REGLAN) tablet 5-10 mg  5-10 mg Oral Q8H PRN Tommie Ard, PA-C       Or  . metoCLOPramide (REGLAN) injection 5-10 mg  5-10 mg Intravenous Q8H PRN Tommie Ard, PA-C      . morphine 2 MG/ML injection 0.5-1 mg  0.5-1 mg Intravenous Q2H PRN Tommie Ard, PA-C   1 mg at 06/29/20 1743  . multivitamin with minerals tablet 1 tablet  1 tablet Oral Daily Marguerita Merles [place], [place]   1 tablet at 07/01/20 0831  . ondansetron (ZOFRAN) tablet 4 mg  4 mg Oral Q6H PRN Dennie Bible R, PA-C       Or  . ondansetron [facility]) injection 4 mg  4 mg Intravenous Q6H PRN Tommie Ard, PA-C   4 mg at 06/29/20 1049  . polyethylene glycol (MIRALAX / GLYCOLAX) packet 17 g  17 g Oral BID Tommie Ard, PA-C   17 g at 07/01/20 0831  . senna-docusate (Senokot-S) tablet 1 tablet  1 tablet Oral BID Marguerita Merles [place], DO   1 tablet at 07/01/20 0831  . traMADol (ULTRAM) tablet 50-100 mg  50-100 mg Oral Q6H PRN Tommie Ard, PA-C   50 mg at 07/01/20 1316     Discharge Medications: Please see discharge summary for a list of discharge medications.  Relevant Imaging Results:  Relevant Lab Results:   Additional Information ss#[id number]  Cristobal Goldmann, LCSW

## 2020-07-02 DIAGNOSIS — E871 Hypo-osmolality and hyponatremia: Secondary | ICD-10-CM | POA: Diagnosis not present

## 2020-07-02 LAB — GLUCOSE, CAPILLARY
Glucose-Capillary: 177 mg/dL — ABNORMAL HIGH (ref 70–99)
Glucose-Capillary: 85 mg/dL (ref 70–99)

## 2020-07-02 LAB — COMPREHENSIVE METABOLIC PANEL
ALT: 14 U/L (ref 0–44)
AST: 22 U/L (ref 15–41)
Albumin: 2.4 g/dL — ABNORMAL LOW (ref 3.5–5.0)
Alkaline Phosphatase: 52 U/L (ref 38–126)
Anion gap: 9 (ref 5–15)
BUN: 25 mg/dL — ABNORMAL HIGH (ref 8–23)
CO2: 25 mmol/L (ref 22–32)
Calcium: 8.2 mg/dL — ABNORMAL LOW (ref 8.9–10.3)
Chloride: 94 mmol/L — ABNORMAL LOW (ref 98–111)
Creatinine, Ser: 0.67 mg/dL (ref 0.44–1.00)
GFR, Estimated: >60 mL/min (ref >60)
Glucose, Bld: 94 mg/dL (ref 70–99)
Potassium: 4.6 mmol/L (ref 3.5–5.1)
Sodium: 128 mmol/L — ABNORMAL LOW (ref 135–145)
Total Bilirubin: 0.8 mg/dL (ref 0.3–1.2)
Total Protein: 5 g/dL — ABNORMAL LOW (ref 6.5–8.1)

## 2020-07-02 LAB — CBC WITH DIFFERENTIAL/PLATELET
Abs Immature Granulocytes: 0.06 10*3/uL (ref 0.00–0.07)
Basophils Absolute: 0 10*3/uL (ref 0.0–0.1)
Basophils Relative: 0 %
Eosinophils Absolute: 0.2 10*3/uL (ref 0.0–0.5)
Eosinophils Relative: 2 %
HCT: 30.2 % — ABNORMAL LOW (ref 36.0–46.0)
Hemoglobin: 10.6 g/dL — ABNORMAL LOW (ref 12.0–15.0)
Immature Granulocytes: 1 %
Lymphocytes Relative: 13 %
Lymphs Abs: 1.2 10*3/uL (ref 0.7–4.0)
MCH: 30.9 pg (ref 26.0–34.0)
MCHC: 35.1 g/dL (ref 30.0–36.0)
MCV: 88 fL (ref 80.0–100.0)
Monocytes Absolute: 1.4 10*3/uL — ABNORMAL HIGH (ref 0.1–1.0)
Monocytes Relative: 15 %
Neutro Abs: 6.3 10*3/uL (ref 1.7–7.7)
Neutrophils Relative %: 69 %
Platelets: 311 10*3/uL (ref 150–400)
RBC: 3.43 MIL/uL — ABNORMAL LOW (ref 3.87–5.11)
RDW: 13.6 % (ref 11.5–15.5)
WBC: 9.2 10*3/uL (ref 4.0–10.5)
nRBC: 0 % (ref 0.0–0.2)

## 2020-07-02 LAB — PHOSPHORUS: Phosphorus: 2.6 mg/dL (ref 2.5–4.6)

## 2020-07-02 LAB — MAGNESIUM: Magnesium: 2 mg/dL (ref 1.7–2.4)

## 2020-07-02 MED ORDER — SODIUM CHLORIDE 0.9 % IV SOLN: INTRAVENOUS | Status: DC

## 2020-07-02 NOTE — Progress Notes (Signed)
Physical Therapy Treatment Patient Details Name: Suzanne Hall MRN: [id number] DOB: [birth year] Today's Date: 07/02/2020    History of Present Illness The pt is an 87 yo female presenting with R hip fx after an unwitnessed fall, and is now s/p R THA. CT of head also revealed frontal hematoma. PMH includes: CKD, dementia, HLD, and osteopenia.    PT Comments    Patient received in bed, she reports she is not feeling well. Declining all mobility. She agrees to some bed level exercises with R LE. No reported pain with R LE movement. Patient became agitated with continued attempts to get her to participate. Returned again in PM and patient still refusing PT. We will continue to attempt to get patient to participate with therapy to improve strength and functional mobility.       Follow Up Recommendations  SNF;Supervision/Assistance - 24 hour     Equipment Recommendations  Other (comment) (TBD next venue)    Recommendations for Other Services       Precautions / Restrictions Precautions Precautions: Posterior Hip;Fall Precaution Booklet Issued: Yes (comment) Precaution Comments: handout hanging in room; no recall of precautions Restrictions Weight Bearing Restrictions: Yes RLE Weight Bearing: Weight bearing as tolerated    Mobility  Bed Mobility               General bed mobility comments: declined OOB activities or sitting EOB to assess BP  Transfers                 General transfer comment: patient declined all oob activity despite multiple attempts and max encouragement. Patient's son was called, but he did not answer.  Ambulation/Gait                 Stairs             Wheelchair Mobility    Modified Rankin (Stroke Patients Only)       Balance                                            Cognition Arousal/Alertness: Awake/alert Behavior During Therapy: WFL for tasks assessed/performed Overall Cognitive Status: History  of cognitive impairments - at baseline                                 General Comments: dementia at baseline, kept eyes closed for most of session. Repetitive with questions, becomes agitated with increased encouragement to get out of bed      Exercises Total Joint Exercises Ankle Circles/Pumps: AAROM;Both;10 reps Heel Slides: AAROM;Both;5 reps Hip ABduction/ADduction: AAROM;Both;10 reps    General Comments General comments (skin integrity, edema, etc.): Assessed BP initially at 132/56 (75) but pt declined to sit EOB to further assess orthostatic BPs. Attempted to call son during OT's second attempt but no answer, pt declined to leave voicemail.      Pertinent Vitals/Pain Pain Assessment: Faces Faces Pain Scale: No hurt Pain Intervention(s): Monitored during session    Home Living                      Prior Function            PT Goals (current goals can now be found in the care plan section) Acute Rehab PT Goals Patient Stated Goal: to rest PT  Goal Formulation: With patient Time For Goal Achievement: 07/11/20 Potential to Achieve Goals: Poor Progress towards PT goals: Not progressing toward goals - comment (declining mobility despite max encouragement and multiple attempts)    Frequency    Min 2X/week      PT Plan Current plan remains appropriate    Co-evaluation              AM-PAC PT "6 Clicks" Mobility   Outcome Measure  Help needed turning from your back to your side while in a flat bed without using bedrails?: Total Help needed moving from lying on your back to sitting on the side of a flat bed without using bedrails?: Total Help needed moving to and from a bed to a chair (including a wheelchair)?: Total Help needed standing up from a chair using your arms (e.g., wheelchair or bedside chair)?: Total Help needed to walk in hospital room?: Total Help needed climbing 3-5 steps with a railing? : Total 6 Click Score: 6    End  of Session   Activity Tolerance: Other (comment) (patient reports she is not feeling well) Patient left: in bed;with bed alarm set;with call bell/phone within reach   PT Visit Diagnosis: Other abnormalities of gait and mobility (R26.89);Muscle weakness (generalized) (M62.81)     Time: 4536-4680 PT Time Calculation (min) (ACUTE ONLY): 16 min  Charges:  $Therapeutic Exercise: 8-22 mins                     Dameer Speiser, PT, GCS 07/02/20,2:09 PM

## 2020-07-02 NOTE — Progress Notes (Signed)
Occupational Therapy Treatment Patient Details Name: Suzanne Hall MRN: [id number] DOB: 11/30/1932 Today's Date: 07/02/2020    History of present illness The pt is an 87 yo female presenting with R hip fx after an unwitnessed fall, and is now s/p R THA. CT of head also revealed frontal hematoma. PMH includes: CKD, dementia, HLD, and osteopenia.   OT comments  Pt with slow progress towards OT goals, declined OOB activities despite encouragement (and attempted to call son for pt encouragement as well). Pt able to demo simple grooming and self feeding tasks bed level, but declined further ADL attempts. Reinforced hip precautions (poor carryover), importance of movement as pt reports being ambulatory before. Hope to progress to further OOB ADLs during next session as appropriate.    Follow Up Recommendations  SNF;Supervision/Assistance - 24 hour    Equipment Recommendations  3 in 1 bedside commode    Recommendations for Other Services      Precautions / Restrictions Precautions Precautions: Posterior Hip;Fall Precaution Booklet Issued: Yes (comment) Precaution Comments: handout hanging in room; no recall of precautions Restrictions Weight Bearing Restrictions: Yes RLE Weight Bearing: Weight bearing as tolerated       Mobility Bed Mobility               General bed mobility comments: declined OOB activities or sitting EOB to assess BP  Transfers                 General transfer comment: pt declined despite encouragement, reports not feeling well    Balance                                           ADL either performed or assessed with clinical judgement   ADL Overall ADL's : Needs assistance/impaired Eating/Feeding: Set up;Sitting Eating/Feeding Details (indicate cue type and reason): Setup to eat breakfast though noted did not eat much Grooming: Supervision/safety;Bed level;Wash/dry face Grooming Details (indicate cue type and reason):  Supervision for cueing to initiate washing face, did so quickly                               General ADL Comments: Limited by cognition primarily. demo minimal signs of pain with LE movement/ROM     Vision   Vision Assessment?: No apparent visual deficits   Perception     Praxis      Cognition Arousal/Alertness: Awake/alert;Lethargic Behavior During Therapy: WFL for tasks assessed/performed Overall Cognitive Status: History of cognitive impairments - at baseline                                 General Comments: dementia at baseline, kept eyes closed for most of session. Repetitive with questions, becomes agitated with increased encouragement to get out of bed        Exercises     Shoulder Instructions       General Comments Assessed BP initially at 132/56 (75) but pt declined to sit EOB to further assess orthostatic BPs. Attempted to call son during OT's second attempt but no answer, pt declined to leave voicemail.    Pertinent Vitals/ Pain       Pain Assessment: Faces Faces Pain Scale: No hurt Pain Intervention(s): Monitored during session  Home Living  Prior Functioning/Environment              Frequency  Min 2X/week        Progress Toward Goals  OT Goals(current goals can now be found in the care plan section)  Progress towards OT goals: OT to reassess next treatment  Acute Rehab OT Goals Patient Stated Goal: to rest OT Goal Formulation: With patient/family Time For Goal Achievement: 07/13/20 Potential to Achieve Goals: Good ADL Goals Pt Will Perform Grooming: with supervision;standing Pt Will Perform Lower Body Dressing: with min assist;sit to/from stand;sitting/lateral leans Pt Will Transfer to Toilet: with min guard assist;ambulating Pt Will Perform Toileting - Clothing Manipulation and hygiene: with min guard assist;sitting/lateral leans;sit to/from  stand Pt/caregiver will Perform Home Exercise Program: Increased strength;Both right and left upper extremity;With Supervision Additional ADL Goal #1: Pt will increase to minguardA for bed mobility as pre ADL tasks.  Plan Discharge plan remains appropriate    Co-evaluation                 AM-PAC OT "6 Clicks" Daily Activity     Outcome Measure   Help from another person eating meals?: A Little Help from another person taking care of personal grooming?: A Little Help from another person toileting, which includes using toliet, bedpan, or urinal?: A Lot Help from another person bathing (including washing, rinsing, drying)?: A Lot Help from another person to put on and taking off regular upper body clothing?: A Little Help from another person to put on and taking off regular lower body clothing?: A Lot 6 Click Score: 15    End of Session    OT Visit Diagnosis: Unsteadiness on feet (R26.81);Muscle weakness (generalized) (M62.81);Pain   Activity Tolerance Other (comment) (limited by cognition)   Patient Left in bed;with call bell/phone within reach;with bed alarm set;Other (comment) (fall mats)   Nurse Communication Mobility status        Time: 7035-0093 OT Time Calculation (min): 12 min  Charges: OT General Charges $OT Visit: 1 Visit OT Treatments $Self Care/Home Management : 8-22 mins  Lorre Munroe, OTR/L   Lorre Munroe 07/02/2020, 11:10 AM

## 2020-07-02 NOTE — Progress Notes (Signed)
PROGRESS NOTE   Suzanne Hall  NOM:[id number]    DOB: 1933-06-14    DOA: 06/25/2020  PCP: Patient, No Pcp Per   I have briefly reviewed patients previous medical records in [facility].  Chief Complaint  Patient presents with  . Fall/Hip Pain     Brief Narrative:  87 year old female with PMH of dementia, brought to the ED after found to have an unwitnessed fall and was found on the floor.  She complained of right hip pain.  In the ED, x-ray showed right hip fracture and CT head showed frontal scalp hematoma.  Lab work significant for sodium of 128.  Orthopedics consulted and s/p right hip hemiarthroplasty 06/26/2020.  Course complicated by postop delirium/sundowning, needed restraints, now off.  Postop acute blood loss anemia, s/p blood transfusion.  Treated for UTI.  CT abdomen and pelvis showed 9.7 cm deep subcutaneous hematoma laterally in the right hip and 4.6 cm left cystic adnexal process and fecal dilatation of rectum up to 7.8 cm.  Developed orthostatic hypotension 1/4, bolused with IVF.  Ongoing poor oral intake.   Assessment & Plan:  Principal Problem:   Closed right hip fracture [facility]) Active Problems:   Hyponatremia   Dementia (HCC)   Hip fracture (HCC)   Right hip fracture following unwitnessed fall Orthopedics consulted.  S/p right hip hemiarthroplasty 06/26/2020.  Head CT on admission showed small right frontal scalp hematoma but no acute intracranial process.  Orthopedics recommends aspirin 81 mg twice daily for VTE prophylaxis postop.  Weightbearing as tolerated.  TOC exploring SNF options.  Hyponatremia Cause not clear. TSH: 3.38.  Cortisol level 29.2.  May have an element of SIADH related to pain.  Serum sodium stable in the high 120s.  Follow BMP in a.m.  If worse or not improving, consider further evaluation including serum and urine osmolarity.  Clinically appears on the dry side due to ongoing poor oral intake.  We will hydrate briefly with IV normal  saline.  Constipation/fecal impaction CT abdomen and pelvis 1/4 showed fecal dilatation of the rectum up to 7.8 cm diameter.  As per RN, patient had a very large BM on 1/5.  Consider KUB to assess rectal stool burden in a.m.  Aggressive bowel regimen.  Leukocytosis Secondary to stress margination from fall and from UTI.  Resolved.  Suspected UTI Urine culture post antibiotics was negative.  Has already received 5 days of IV ceftriaxone.  Discontinued antibiotics.  Elevated BP Blood pressures look okay.  Hypokalemia Replaced.  Magnesium 2.  Pelvic adnexal lesion CT abdomen pelvis 1/4 showed uterus unremarkable, 4.6 cm hypodense appearing left cystic adnexal process.  Ultrasound was recommended.  Transabdominal pelvic ultrasound shows bilateral ovarian cysts, measuring up to 5.1 cm in the left adnexa.  Recommend outpatient follow-up with GYN.  Near syncope and orthostatic hypotension Near syncope in the setting of orthostatic hypotension from acute blood loss anemia. S/p 3 units PRBC transfusion.  She was significantly orthostatic on 1/4 requiring IV fluid bolus and maintenance IV fluids.  TED hose ordered but did not see this morning.  Did not see orthostatic blood pressures rechecked today despite order.  Recheck it in a.m.  Postop acute blood loss anemia Hemoglobin dropped to a low of 6.6.  S/p 3 units PRBC thus far.  Hemoglobin stable/10.6 today.  Anemia panel appreciated, low normal B12 levels.  Likely multifactorial due to surgical blood loss and dilutional.  Also CT abdomen and pelvis showed 9.7 cm deep subcutaneous hematoma laterally in  the hip.  Continue ferrous sulfate supplements.  As per orthopedics, hold aspirin for now.  No overt bleeding noted.  Continue to follow CBC.  History of dementia Delirium precautions.  Had issues with hospital delirium.  Required restraints but not on today.  Reactive thrombocytosis Resolved  Scalp hematoma Noted on CT head on  admission.  Hyperglycemia A1c 5.4.  No diagnosis of diabetes.  Hypophosphatemia Replaced.  Body mass index is 18.8 kg/m.  Nutritional Status Nutrition Problem: Severe Malnutrition Etiology: chronic illness (dementia) Signs/Symptoms: severe fat depletion,moderate fat depletion,moderate muscle depletion,severe muscle depletion Interventions: Ensure Enlive (each supplement provides 350kcal and 20 grams of protein),Magic cup,MVI  Pressure Ulcer:     DVT prophylaxis: Place TED hose Start: 07/01/20 1449 Place and maintain sequential compression device Start: 06/29/20 0735 SCDs Start: 06/26/20 1135 Place TED hose Start: 06/26/20 1135 SCDs Start: 06/25/20 2308     Code Status: Full Code Family Communication:  Disposition:  Status is: Inpatient  Remains inpatient appropriate because:Inpatient level of care appropriate due to severity of illness   Dispo: The patient is from: Home              Anticipated d/c is to: SNF              Anticipated d/c date is: 1 day              Patient currently is not medically stable to d/c.        Consultants:   Orthopedics  Procedures:   As above  Antimicrobials:    Anti-infectives (From admission, onward)   Start     Dose/Rate Route Frequency Ordered Stop   06/26/20 1900  cefTRIAXone (ROCEPHIN) 1 g in sodium chloride 0.9 % 100 mL IVPB        1 g 200 mL/hr over 30 Minutes Intravenous Every 24 hours 06/26/20 1809     06/26/20 1430  ceFAZolin (ANCEF) IVPB 2g/100 mL premix  Status:  Discontinued        2 g 200 mL/hr over 30 Minutes Intravenous Every 6 hours 06/26/20 1134 06/26/20 1811   06/26/20 0845  ceFAZolin (ANCEF) IVPB 2g/100 mL premix        2 g 200 mL/hr over 30 Minutes Intravenous On call to O.R. 06/26/20 0756 06/26/20 0916        Subjective:  Patient reports BM 1/4.  Per RN, large BM this morning.  Noted breakfast tray without patient having eaten much.  RN collaborates poor oral intake.  No abdominal pain reported.   States that her postop hip is sore but no significant pain reported  Objective:   Vitals:   07/01/20 1505 07/01/20 1951 07/02/20 0803 07/02/20 1454  BP: (!) 122/53 119/61 (!) 133/55 (!) 138/55  Pulse: 78 74 71 75  Resp: 16  16 16   Temp: 97.9 F (36.6 C) 98.7 F (37.1 C) (!) 97.3 F (36.3 C) 98.6 F (37 C)  TempSrc: Oral Oral Oral Oral  SpO2: 96% 96% 99% 97%  Weight:      Height:        General exam: Elderly female, small built and frail lying comfortably propped up in bed.  Taking a nap this morning.  Oral mucosa borderline hydration. Respiratory system: Clear to auscultation. Respiratory effort normal. Cardiovascular system: S1 & S2 heard, RRR. No JVD, murmurs, rubs, gallops or clicks. No pedal edema. Gastrointestinal system: Abdomen is nondistended, soft and nontender. No organomegaly or masses felt. Normal bowel sounds heard. Central nervous system: Sleeping  but easily arousable, oriented x2. No focal neurological deficits. Extremities: Symmetric 5 x 5 power.  Left hip postop dressing clean, dry and intact Skin: No rashes, lesions or ulcers Psychiatry: Judgement and insight appear somewhat impaired. Mood & affect appropriate, did not appear confused or agitated.     Data Reviewed:   I have personally reviewed following labs and imaging studies   CBC: Recent Labs  Lab 06/30/20 0449 06/30/20 1804 07/01/20 0913 07/02/20 0216  WBC 6.8  --  9.2 9.2  NEUTROABS 4.4  --  7.6 6.3  HGB 7.7* 11.5* 13.0 10.6*  HCT 23.3* 33.3* 37.2 30.2*  MCV 90.7  --  88.4 88.0  PLT 256  --  418* 311    Basic Metabolic Panel: Recent Labs  Lab 06/30/20 0449 07/01/20 0913 07/02/20 0216  NA 130* 127* 128*  K 4.3 3.4* 4.6  CL 96* 90* 94*  CO2 25 25 25   GLUCOSE 96 135* 94  BUN 18 15 25*  CREATININE 0.76 0.83 0.67  CALCIUM 8.2* 9.3 8.2*  MG 2.0 2.2 2.0  PHOS 3.0 2.8 2.6    Liver Function Tests: Recent Labs  Lab 06/30/20 0449 07/01/20 0913 07/02/20 0216  AST 24 29 22   ALT 15  17 14   ALKPHOS 39 57 52  BILITOT 0.6 1.1 0.8  PROT 4.8* 6.6 5.0*  ALBUMIN 2.3* 3.1* 2.4*    CBG: Recent Labs  Lab 07/01/20 1801 07/02/20 0018 07/02/20 1158  GLUCAP 136* 85 177*    Microbiology Studies:   Recent Results (from the past 240 hour(s))  Resp Panel by RT-PCR (Flu A&B, Covid) Nasopharyngeal Swab     Status: None   Collection Time: 06/25/20  8:18 PM   Specimen: Nasopharyngeal Swab; Nasopharyngeal(NP) swabs in vial transport medium  Result Value Ref Range Status   SARS Coronavirus 2 by RT PCR NEGATIVE NEGATIVE Final    Comment: (NOTE) SARS-CoV-2 target nucleic acids are NOT DETECTED.  The SARS-CoV-2 RNA is generally detectable in upper respiratory specimens during the acute phase of infection. The lowest concentration of SARS-CoV-2 viral copies this assay can detect is 138 copies/mL. A negative result does not preclude SARS-Cov-2 infection and should not be used as the sole basis for treatment or other patient management decisions. A negative result may occur with  improper specimen collection/handling, submission of specimen other than nasopharyngeal swab, presence of viral mutation(s) within the areas targeted by this assay, and inadequate number of viral copies(<138 copies/mL). A negative result must be combined with clinical observations, patient history, and epidemiological information. The expected result is Negative.  Fact Sheet for Patients:  08/30/20  Fact Sheet for Healthcare Providers:  08/30/20  This test is no t yet approved or cleared by the 06/27/20 FDA and  has been authorized for detection and/or diagnosis of SARS-CoV-2 by FDA under an Emergency Use Authorization (EUA). This EUA will remain  in effect (meaning this test can be used) for the duration of the COVID-19 declaration under Section 564(b)(1) of the Act, 21 U.S.C.section 360bbb-3(b)(1), unless the authorization is  terminated  or revoked sooner.       Influenza A by PCR NEGATIVE NEGATIVE Final   Influenza B by PCR NEGATIVE NEGATIVE Final    Comment: (NOTE) The Xpert Xpress SARS-CoV-2/FLU/RSV plus assay is intended as an aid in the diagnosis of influenza from Nasopharyngeal swab specimens and should not be used as a sole basis for treatment. Nasal washings and aspirates are unacceptable for Xpert Xpress SARS-CoV-2/FLU/RSV testing.  Fact Sheet for Patients: BloggerCourse.com  Fact Sheet for Healthcare Providers: SeriousBroker.it  This test is not yet approved or cleared by the [place] FDA and has been authorized for detection and/or diagnosis of SARS-CoV-2 by FDA under an Emergency Use Authorization (EUA). This EUA will remain in effect (meaning this test can be used) for the duration of the COVID-19 declaration under Section 564(b)(1) of the Act, 21 U.S.C. section 360bbb-3(b)(1), unless the authorization is terminated or revoked.  Performed at [facility], [address], [place], [place] [address]   Surgical pcr screen     Status: None   Collection Time: 06/26/20  8:01 AM   Specimen: Nasal Mucosa; Nasal Swab  Result Value Ref Range Status   MRSA, PCR NEGATIVE NEGATIVE Final   Staphylococcus aureus NEGATIVE NEGATIVE Final    Comment: (NOTE) The Xpert SA Assay (FDA approved for NASAL specimens in patients 75 years of age and older), is one component of a comprehensive surveillance program. It is not intended to diagnose infection nor to guide or monitor treatment. Performed at [facility], [address], [place], [place] [address]   Culture, Urine     Status: None   Collection Time: 06/27/20 11:29 AM   Specimen: Urine, Clean Catch  Result Value Ref Range Status   Specimen Description URINE, CLEAN CATCH  Final   Special Requests NONE  Final   Culture   Final    NO GROWTH Performed at [facility],  [address], [place], [place] [address]    Report Status 06/28/2020 FINAL  Final     Radiology Studies:  US PELVIS (TRANSABDOMINAL ONLY)  Result Date: 07/01/2020 CLINICAL DATA:  Adnexal lesion on CT EXAM: TRANSABDOMINAL ULTRASOUND OF PELVIS TECHNIQUE: Transabdominal ultrasound examination of the pelvis was performed including evaluation of the uterus, ovaries, adnexal regions, and pelvic cul-de-sac. COMPARISON:  CT 06/30/2020 FINDINGS: Uterus Measurements: 4.6 x 3 x 3.6 cm = volume: 45.7 mL. No fibroids or other mass visualized. Endometrium Thickness: 5.2 mm.  No focal abnormality visualized. Right ovary Measurements: 2.4 x 2 x 2.1 cm = volume: 5.3 mL. 2 x 1.7 x 1.9 cm anechoic cyst. Left ovary Measurements: 4.8 x 4.4 x 5 cm = volume: 54.9 mL. 5.1 by 4.7 x 4.5 cm anechoic cyst in the left adnexa. Other findings:  No abnormal free fluid. IMPRESSION: 1. Bilateral ovarian cysts, measuring up to 5.1 cm in the left adnexa. Consider GYN consult and followup US in 3-6 months, or pelvis MRI w/o and w/ contrast for improved characterization. Note: This recommendation does not apply to premenarchal patients or to those with increased risk (genetic, family history, elevated tumor markers or other high-risk factors) of ovarian cancer. Reference: Radiology 2019 Nov; 293(2):359-371. Electronically Signed   By: Jasmine Pang M.D.   On: 07/01/2020 19:48     Scheduled Meds:   . sodium chloride   Intravenous Once  . feeding supplement  237 mL Oral TID BM  . ferrous sulfate  325 mg Oral TID PC  . multivitamin with minerals  1 tablet Oral Daily  . polyethylene glycol  17 g Oral BID  . senna-docusate  1 tablet Oral BID    Continuous Infusions:   . sodium chloride 75 mL/hr at 06/27/20 1752  . cefTRIAXone (ROCEPHIN)  IV 1 g (07/01/20 1813)  . lactated ringers 10 mL/hr at 06/26/20 0835  . methocarbamol (ROBAXIN) IV       LOS: 7 days  Vernell Leep, MD, [place], [facility]. [facility]    To contact the  attending provider between 7A-7P or the covering provider during after hours 7P-7A, please log into the web site [email or web address] and access using universal [place] password for that web site. If you do not have the password, please call the hospital operator.  07/02/2020, 6:56 PM

## 2020-07-02 NOTE — TOC Progression Note (Signed)
Transition of Care [facility]) - Progression Note    Patient Details  Name: DELILAH MULGREW MRN: [id number] Date of Birth: 1933-02-17  Transition of Care [facility]) CM/SW Contact  Okey Dupre Lazaro Arms, LCSW Phone Number: 07/02/2020, 5:19 PM  Clinical Narrative:  Received call from patient's son Avryl Roehm [phone]) regarding facility search. He was provided with facility bed offers and responses for his preferences: [facility] declined as no bed availability; [facility] declined due to "not eligible" and [facility] did not respond. Mr. Pangilinan requested that [facility] and [facility] be contacted and clinicals sent to these facilities.       Expected Discharge Plan: Skilled Nursing Facility Barriers to Discharge: Other (comment) (PASRR number, facility selection, insurance authorization)  Expected Discharge Plan and Services Expected Discharge Plan: Skilled Nursing Facility In-house Referral: Clinical Social Work     Living arrangements for the past 2 months: Assisted Living Facility ([facility])                                     Social Determinants of Health (SDOH) Interventions  No SDOH interventions requested or needed at this time  Readmission Risk Interventions No flowsheet data found.

## 2020-07-02 NOTE — Progress Notes (Signed)
Occupational Therapy Treatment Patient Details Name: Suzanne Hall MRN: [id number] DOB: 1932/07/09 Today's Date: 07/02/2020    History of present illness The pt is an 87 yo female presenting with R hip fx after an unwitnessed fall, and is now s/p R THA. CT of head also revealed frontal hematoma. PMH includes: CKD, dementia, HLD, and osteopenia.   OT comments  Patient bed alarm going off, found trying to get out of bed.  OT asked what she needed, patient requested use of bathroom for BM.  OT assisted her via RW to ambulate to toilet with Min A, verbal cues for safety and RW management.  Patient with loose stool on back of legs.  OT had patient stand at sink to wash legs, patient able to wash hands post hygiene with supervision.  Patient escorted back to bed, NT notified of BM, patient able to lie down with min a for R leg.  Patient does not remember fracturing her leg, but knows it hurts.  Bed alarm set and all needs within reach.  Patient will need 24 hour assist for safety regarding posterior hip precautions, ADL/toileting and fall prevention.  SNF has been recommended.    Follow Up Recommendations  SNF;Supervision/Assistance - 24 hour    Equipment Recommendations  3 in 1 bedside commode    Recommendations for Other Services      Precautions / Restrictions Precautions Precautions: Posterior Hip;Fall Precaution Comments: handout hanging in room; no recall of precautions Restrictions Weight Bearing Restrictions: Yes RLE Weight Bearing: Weight bearing as tolerated       Mobility Bed Mobility Overal bed mobility: Needs Assistance Bed Mobility: Supine to Sit;Sit to Supine     Supine to sit: Min guard Sit to supine: Min guard   General bed mobility comments: patient found sitting on edge of bed trying to get to the bathroom  Transfers Overall transfer level: Needs assistance Equipment used: Rolling walker (2 wheeled) Transfers: Sit to/from [employer] Sit to  Stand: Min guard         General transfer comment: patient declined all oob activity despite multiple attempts and max encouragement. Patient's son was called, but he did not answer.    Balance Overall balance assessment: Needs assistance Sitting-balance support: Single extremity supported;Feet supported Sitting balance-Leahy Scale: Fair     Standing balance support: Bilateral upper extremity supported Standing balance-Leahy Scale: Poor Standing balance comment: use of RW for mobility                           ADL either performed or assessed with clinical judgement   ADL       Grooming: Wash/dry hands;Min guard;Standing       Lower Body Bathing: Maximal assistance;Sit to/from stand           Toilet Transfer: Minimal assistance;Regular Toilet;RW;Ambulation   Psychiatrist and Hygiene: Min guard;Sitting/lateral lean       Functional mobility during ADLs: Minimal assistance;Rolling walker                         Cognition Arousal/Alertness: Awake/alert Behavior During Therapy: WFL for tasks assessed/performed Overall Cognitive Status: History of cognitive impairments - at baseline                                 General Comments: dementia at baseline, kept eyes closed for most  of session. Repetitive with questions, becomes agitated with increased encouragement to get out of bed        Exercises Total Joint Exercises Ankle Circles/Pumps: AAROM;Both;10 reps Heel Slides: AAROM;Both;5 reps Hip ABduction/ADduction: AAROM;Both;10 reps   Shoulder Instructions       General Comments      Pertinent Vitals/ Pain       Pain Assessment: Faces Faces Pain Scale: Hurts little more Pain Location: R hip with movement Pain Descriptors / Indicators: Grimacing;Discomfort;Moaning Pain Intervention(s): Monitored during session                                                           Frequency  Min 2X/week        Progress Toward Goals  OT Goals(current goals can now be found in the care plan section)  Progress towards OT goals: Progressing toward goals  Acute Rehab OT Goals Patient Stated Goal: I need something to drink OT Goal Formulation: With patient Time For Goal Achievement: 07/13/20 Potential to Achieve Goals: Fair  Plan      Co-evaluation                 AM-PAC OT "6 Clicks" Daily Activity     Outcome Measure   Help from another person eating meals?: A Little Help from another person taking care of personal grooming?: A Little Help from another person toileting, which includes using toliet, bedpan, or urinal?: A Lot Help from another person bathing (including washing, rinsing, drying)?: A Lot Help from another person to put on and taking off regular upper body clothing?: A Little Help from another person to put on and taking off regular lower body clothing?: A Lot 6 Click Score: 15    End of Session Equipment Utilized During Treatment: Rolling walker  OT Visit Diagnosis: Unsteadiness on feet (R26.81);Muscle weakness (generalized) (M62.81);Pain Pain - Right/Left: Right Pain - part of body: Leg   Activity Tolerance     Patient Left in bed;with call bell/phone within reach;with bed alarm set   Nurse Communication Other (comment) (patient used BSC over toilet for BM)        Time: 1500-1521 OT Time Calculation (min): 21 min  Charges: OT General Charges $OT Visit: 1 Visit OT Treatments $Self Care/Home Management : 8-22 mins  07/02/2020  Rich, OTR/L  Acute Rehabilitation Services  Office:  [phone]    Suzanne Hall 07/02/2020, 3:29 PM

## 2020-07-03 DIAGNOSIS — E43 Unspecified severe protein-calorie malnutrition: Secondary | ICD-10-CM | POA: Insufficient documentation

## 2020-07-03 DIAGNOSIS — E871 Hypo-osmolality and hyponatremia: Secondary | ICD-10-CM | POA: Diagnosis not present

## 2020-07-03 LAB — BASIC METABOLIC PANEL
Anion gap: 9 (ref 5–15)
BUN: 22 mg/dL (ref 8–23)
CO2: 25 mmol/L (ref 22–32)
Calcium: 8.5 mg/dL — ABNORMAL LOW (ref 8.9–10.3)
Chloride: 94 mmol/L — ABNORMAL LOW (ref 98–111)
Creatinine, Ser: 0.67 mg/dL (ref 0.44–1.00)
GFR, Estimated: >60 mL/min (ref >60)
Glucose, Bld: 94 mg/dL (ref 70–99)
Potassium: 4.1 mmol/L (ref 3.5–5.1)
Sodium: 128 mmol/L — ABNORMAL LOW (ref 135–145)

## 2020-07-03 LAB — GLUCOSE, CAPILLARY
Glucose-Capillary: 162 mg/dL — ABNORMAL HIGH (ref 70–99)
Glucose-Capillary: 83 mg/dL (ref 70–99)
Glucose-Capillary: 90 mg/dL (ref 70–99)
Glucose-Capillary: 97 mg/dL (ref 70–99)

## 2020-07-03 LAB — OSMOLALITY, URINE: Osmolality, Ur: 521 mOsm/kg (ref 300–900)

## 2020-07-03 LAB — OSMOLALITY: Osmolality: 285 mOsm/kg (ref 275–295)

## 2020-07-03 NOTE — Progress Notes (Signed)
PROGRESS NOTE   Suzanne Hall  ZHY:[id number]    DOB: 1932/08/22    DOA: 06/25/2020  PCP: Patient, No Pcp Per   I have briefly reviewed patients previous medical [facility] in [facility]  Chief Complaint  Patient presents with  . Fall/Hip Pain     Brief Narrative:  87 year old female with PMH of dementia, brought to the ED after found to have an unwitnessed fall and was found on the floor.  She complained of right hip pain.  In the ED, x-ray showed right hip fracture and CT head showed frontal scalp hematoma.  Lab work significant for sodium of 128.  Orthopedics consulted and s/p right hip hemiarthroplasty 06/26/2020.  Course complicated by postop delirium/sundowning, needed restraints, now off.  Postop acute blood loss anemia, s/p blood transfusion.  Treated for UTI.  CT abdomen and pelvis showed 9.7 cm deep subcutaneous hematoma laterally in the right hip and 4.6 cm left cystic adnexal process and fecal dilatation of rectum up to 7.8 cm.  Developed orthostatic hypotension 1/4, bolused with IVF.  Improving.  Possible DC to SNF 1/7 pending bed and insurance.   Assessment & Plan:  Principal Problem:   Closed right hip fracture (HCC) Active Problems:   Hyponatremia   Dementia (HCC)   Hip fracture (HCC)   Protein-calorie malnutrition, severe   Right hip fracture following unwitnessed fall Orthopedics consulted.  S/p right hip hemiarthroplasty 06/26/2020.  Head CT on admission showed small right frontal scalp hematoma but no acute intracranial process.  Orthopedics recommends aspirin 81 mg twice daily for VTE prophylaxis postop.  Weightbearing as tolerated.  TOC exploring SNF options-possibly DC 1/7.  Hyponatremia Cause not clear. TSH: 3.38.  Cortisol level 29.2.  May have an element of SIADH related to pain.  Serum sodium stable in the high 120s.  Serum sodium stable in the 127-128 range for the last 3 days.  I reviewed her case in detail with nephrology on 1/6.  Urine osmolarity  521, urine sodium on 12/30: 81.  He suggested that she likely has some degree of SIADH and recommended fluid restriction and it may take several days to gradually improve but she seems to be stable and tolerating current sodium levels.  Outpatient follow-up at SNF  Constipation/fecal impaction CT abdomen and pelvis 1/4 showed fecal dilatation of the rectum up to 7.8 cm diameter.  As per RN attempt to digitally disimpact on 1/6, no fecal impaction noted.  Patient has had couple of large BMs yesterday and this morning.  Continue aggressive bowel regimen.  Leukocytosis Secondary to stress margination from fall and from UTI.  Resolved.  Suspected UTI Urine culture post antibiotics was negative.  Has already received 5 days of IV ceftriaxone.  Discontinued antibiotics.  Elevated BP Blood pressures look okay.  Hypokalemia Replaced.  Magnesium 2.  Pelvic adnexal lesion CT abdomen pelvis 1/4 showed uterus unremarkable, 4.6 cm hypodense appearing left cystic adnexal process.  Ultrasound was recommended.  Transabdominal pelvic ultrasound shows bilateral ovarian cysts, measuring up to 5.1 cm in the left adnexa.  Recommend outpatient follow-up with GYN.  Near syncope and orthostatic hypotension Near syncope in the setting of orthostatic hypotension from acute blood loss anemia. S/p 3 units PRBC transfusion.  She was significantly orthostatic on 1/4 requiring IV fluid bolus and maintenance IV fluids.  Still orthostatic on 1/5.  Denies orthostatic symptoms and no orthostatic vitals done today.  Bilateral lower extremity compression stockings.  Postop acute blood loss anemia Hemoglobin dropped to a  low of 6.6.  S/p 3 units PRBC thus far.  Hemoglobin stable/10.6 today.  Anemia panel appreciated, low normal B12 levels.  Likely multifactorial due to surgical blood loss and dilutional.  Also CT abdomen and pelvis showed 9.7 cm deep subcutaneous hematoma laterally in the hip.  Continue ferrous sulfate  supplements.  As per orthopedics, hold aspirin for now.  No overt bleeding noted.  Continue to follow CBC.  History of dementia Delirium precautions.  Had issues with hospital delirium.  Required restraints.  I discussed with patient's son at bedside today.  He indicates that patient's mental status has improved and close to baseline.  She does have periods of confusion.  Reactive thrombocytosis Resolved  Scalp hematoma Noted on CT head on admission.  Hyperglycemia A1c 5.4.  No diagnosis of diabetes.  Hypophosphatemia Replaced.  Body mass index is 18.8 kg/m.  Nutritional Status Nutrition Problem: Severe Malnutrition Etiology: chronic illness (dementia) Signs/Symptoms: severe fat depletion,moderate fat depletion,moderate muscle depletion,severe muscle depletion Interventions: Ensure Enlive (each supplement provides 350kcal and 20 grams of protein),Magic cup,MVI  Pressure Ulcer:     DVT prophylaxis: Place TED hose Start: 07/01/20 1449 Place and maintain sequential compression device Start: 06/29/20 0735 SCDs Start: 06/26/20 1135 Place TED hose Start: 06/26/20 1135 SCDs Start: 06/25/20 2308     Code Status: Full Code Family Communication:  Disposition:  Status is: Inpatient  Remains inpatient appropriate because:Inpatient level of care appropriate due to severity of illness   Dispo: The patient is from: Home              Anticipated d/c is to: SNF              Anticipated d/c date is: 1 day              Patient currently is not medically stable to d/c.        Consultants:   Orthopedics  Procedures:   As above  Antimicrobials:    Anti-infectives (From admission, onward)   Start     Dose/Rate Route Frequency Ordered Stop   06/26/20 1900  cefTRIAXone (ROCEPHIN) 1 g in sodium chloride 0.9 % 100 mL IVPB  Status:  Discontinued        1 g 200 mL/hr over 30 Minutes Intravenous Every 24 hours 06/26/20 1809 07/02/20 1916   06/26/20 1430  ceFAZolin (ANCEF) IVPB  2g/100 mL premix  Status:  Discontinued        2 g 200 mL/hr over 30 Minutes Intravenous Every 6 hours 06/26/20 1134 06/26/20 1811   06/26/20 0845  ceFAZolin (ANCEF) IVPB 2g/100 mL premix        2 g 200 mL/hr over 30 Minutes Intravenous On call to O.R. 06/26/20 0756 06/26/20 0916        Subjective:  Patient seen sitting up in reading newspaper this morning.  Denied complaints including pain.  Alert and oriented x2 but at times gets confused.  Keeps asking provider's name despite telling her multiple times.  Son at bedside.  Patient appears to be in good spirits.  As per RN, agitated overnight and pulled out IV line at at midnight  Objective:   Vitals:   07/02/20 1454 07/03/20 0100 07/03/20 1116 07/03/20 1632  BP: (!) 138/55 (!) 149/69 (!) 109/52 (!) 136/51  Pulse: 75 82 81 92  Resp: 16 15 16 16   Temp: 98.6 F (37 C) 98.4 F (36.9 C) 98.4 F (36.9 C) 98.3 F (36.8 C)  TempSrc: Oral Oral Oral Oral  SpO2: 97% 100% 97% 94%  Weight:      Height:        General exam: Elderly female, small built and frail sitting up comfortably in bed reading a newspaper. Respiratory system: Clear to auscultation.  No increased work of breathing. Cardiovascular system: S1 & S2 heard, RRR. No JVD, murmurs, rubs, gallops or clicks. No pedal edema. Gastrointestinal system: Abdomen is nondistended, soft and nontender. No organomegaly or masses felt. Normal bowel sounds heard. Central nervous system: Alert and oriented x2 today. No focal neurological deficits. Extremities: Symmetric 5 x 5 power.  Left hip postop dressing clean, dry and intact Skin: No rashes, lesions or ulcers Psychiatry: Judgement and insight appear somewhat impaired but improving. Mood & affect appropriate, no agitation    Data Reviewed:   I have personally reviewed following labs and imaging studies   CBC: Recent Labs  Lab 06/30/20 0449 06/30/20 1804 07/01/20 0913 07/02/20 0216  WBC 6.8  --  9.2 9.2  NEUTROABS 4.4  --   7.6 6.3  HGB 7.7* 11.5* 13.0 10.6*  HCT 23.3* 33.3* 37.2 30.2*  MCV 90.7  --  88.4 88.0  PLT 256  --  418* 311    Basic Metabolic Panel: Recent Labs  Lab 06/30/20 0449 07/01/20 0913 07/02/20 0216 07/03/20 0136  NA 130* 127* 128* 128*  K 4.3 3.4* 4.6 4.1  CL 96* 90* 94* 94*  CO2 25 25 25 25   GLUCOSE 96 135* 94 94  BUN 18 15 25* 22  CREATININE 0.76 0.83 0.67 0.67  CALCIUM 8.2* 9.3 8.2* 8.5*  MG 2.0 2.2 2.0  --   PHOS 3.0 2.8 2.6  --     Liver Function Tests: Recent Labs  Lab 06/30/20 0449 07/01/20 0913 07/02/20 0216  AST 24 29 22   ALT 15 17 14   ALKPHOS 39 57 52  BILITOT 0.6 1.1 0.8  PROT 4.8* 6.6 5.0*  ALBUMIN 2.3* 3.1* 2.4*    CBG: Recent Labs  Lab 07/03/20 0634 07/03/20 1204 07/03/20 1630  GLUCAP 90 97 162*    Microbiology Studies:   Recent Results (from the past 240 hour(s))  Resp Panel by RT-PCR (Flu A&B, Covid) Nasopharyngeal Swab     Status: None   Collection Time: 06/25/20  8:18 PM   Specimen: Nasopharyngeal Swab; Nasopharyngeal(NP) swabs in vial transport medium  Result Value Ref Range Status   SARS Coronavirus 2 by RT PCR NEGATIVE NEGATIVE Final    Comment: (NOTE) SARS-CoV-2 target nucleic acids are NOT DETECTED.  The SARS-CoV-2 RNA is generally detectable in upper respiratory specimens during the acute phase of infection. The lowest concentration of SARS-CoV-2 viral copies this assay can detect is 138 copies/mL. A negative result does not preclude SARS-Cov-2 infection and should not be used as the sole basis for treatment or other patient management decisions. A negative result may occur with  improper specimen collection/handling, submission of specimen other than nasopharyngeal swab, presence of viral mutation(s) within the areas targeted by this assay, and inadequate number of viral copies(<138 copies/mL). A negative result must be combined with clinical observations, patient history, and epidemiological information. The expected  result is Negative.  Fact Sheet for Patients:  08/31/20  Fact Sheet for Healthcare Providers:  08/31/20  This test is no t yet approved or cleared by the 06/27/20 FDA and  has been authorized for detection and/or diagnosis of SARS-CoV-2 by FDA under an Emergency Use Authorization (EUA). This EUA will remain  in effect (meaning this test can  be used) for the duration of the COVID-19 declaration under Section 564(b)(1) of the Act, 21 U.S.C.section 360bbb-3(b)(1), unless the authorization is terminated  or revoked sooner.       Influenza A by PCR NEGATIVE NEGATIVE Final   Influenza B by PCR NEGATIVE NEGATIVE Final    Comment: (NOTE) The Xpert Xpress SARS-CoV-2/FLU/RSV plus assay is intended as an aid in the diagnosis of influenza from Nasopharyngeal swab specimens and should not be used as a sole basis for treatment. Nasal washings and aspirates are unacceptable for Xpert Xpress SARS-CoV-2/FLU/RSV testing.  Fact Sheet for Patients: EntrepreneurPulse.com.au  Fact Sheet for Healthcare Providers: IncredibleEmployment.be  This test is not yet approved or cleared by the [place] FDA and has been authorized for detection and/or diagnosis of SARS-CoV-2 by FDA under an Emergency Use Authorization (EUA). This EUA will remain in effect (meaning this test can be used) for the duration of the COVID-19 declaration under Section 564(b)(1) of the Act, 21 U.S.C. section 360bbb-3(b)(1), unless the authorization is terminated or revoked.  Performed at [facility], [place] [address], [place], [place] [address]   Surgical pcr screen     Status: None   Collection Time: 06/26/20  8:01 AM   Specimen: Nasal Mucosa; Nasal Swab  Result Value Ref Range Status   MRSA, PCR NEGATIVE NEGATIVE Final   Staphylococcus aureus NEGATIVE NEGATIVE Final    Comment: (NOTE) The Xpert SA Assay  (FDA approved for NASAL specimens in patients 33 years of age and older), is one component of a comprehensive surveillance program. It is not intended to diagnose infection nor to guide or monitor treatment. Performed at [facility], [place] [address] [place], [place] [address]   Culture, Urine     Status: None   Collection Time: 06/27/20 11:29 AM   Specimen: Urine, Clean Catch  Result Value Ref Range Status   Specimen Description URINE, CLEAN CATCH  Final   Special Requests NONE  Final   Culture   Final    NO GROWTH Performed at [facility], [place] [address] [place], [place] [address]    Report Status 06/28/2020 FINAL  Final     Radiology Studies:  US PELVIS (TRANSABDOMINAL ONLY)  Result Date: 07/01/2020 CLINICAL DATA:  Adnexal lesion on CT EXAM: TRANSABDOMINAL ULTRASOUND OF PELVIS TECHNIQUE: Transabdominal ultrasound examination of the pelvis was performed including evaluation of the uterus, ovaries, adnexal regions, and pelvic cul-de-sac. COMPARISON:  CT 06/30/2020 FINDINGS: Uterus Measurements: 4.6 x 3 x 3.6 cm = volume: 45.7 mL. No fibroids or other mass visualized. Endometrium Thickness: 5.2 mm.  No focal abnormality visualized. Right ovary Measurements: 2.4 x 2 x 2.1 cm = volume: 5.3 mL. 2 x 1.7 x 1.9 cm anechoic cyst. Left ovary Measurements: 4.8 x 4.4 x 5 cm = volume: 54.9 mL. 5.1 by 4.7 x 4.5 cm anechoic cyst in the left adnexa. Other findings:  No abnormal free fluid. IMPRESSION: 1. Bilateral ovarian cysts, measuring up to 5.1 cm in the left adnexa. Consider GYN consult and followup US in 3-6 months, or pelvis MRI w/o and w/ contrast for improved characterization. Note: This recommendation does not apply to premenarchal patients or to those with increased risk (genetic, family history, elevated tumor markers or other high-risk factors) of ovarian cancer. Reference: Radiology 2019 Nov; 293(2):359-371. Electronically Signed   By: Donavan Foil M.D.   On: 07/01/2020 19:48      Scheduled Meds:   . feeding supplement  237 mL Oral  TID BM  . ferrous sulfate  325 mg Oral TID PC  . multivitamin with minerals  1 tablet Oral Daily  . polyethylene glycol  17 g Oral BID  . senna-docusate  1 tablet Oral BID    Continuous Infusions:   . methocarbamol (ROBAXIN) IV       LOS: 8 days     Marcellus Scott, MD, [place], [facility]. [facility]    To contact the attending provider between 7A-7P or the covering provider during after hours 7P-7A, please log into the web site [email or web address] and access using universal [place] password for that web site. If you do not have the password, please call the hospital operator.  07/03/2020, 5:48 PM

## 2020-07-03 NOTE — Plan of Care (Addendum)
Pt pulled out IV on L arm, no PIV access at this time, MD made aware.  Problem: Clinical Measurements: Goal: Ability to maintain clinical measurements within normal limits will improve Outcome: Progressing   Problem: Activity: Goal: Risk for activity intolerance will decrease Outcome: Progressing   Problem: Nutrition: Goal: Adequate nutrition will be maintained Outcome: Progressing   Problem: Coping: Goal: Level of anxiety will decrease Outcome: Progressing   Problem: Elimination: Goal: Will not experience complications related to bowel motility Outcome: Progressing   Problem: Pain Managment: Goal: General experience of comfort will improve Outcome: Progressing   Problem: Safety: Goal: Ability to remain free from injury will improve Outcome: Progressing   Problem: Skin Integrity: Goal: Risk for impaired skin integrity will decrease Outcome: Progressing

## 2020-07-03 NOTE — TOC Progression Note (Addendum)
Transition of Care [facility]) - Progression Note    Patient Details  Name: Suzanne Hall MRN: [id number] Date of Birth: 1933-02-06  [facility] of [facility]) CM/SW Contact  Okey Dupre Lazaro Arms, LCSW Phone Number: 07/03/2020, 4:38 PM  Clinical Narrative:  CSW received a call from patient's son Nirvana Blanchett informing CSW that he would be out-of-town and his wife Lanora Manis could be contacted - [phone].  CSW received a call from Carlos American with [employer] [phone]) and was informed that he is a Geriatric Case Production designer, theatre/television/film and had been hired by the family to assist with placement efforts. Mr. Letitia Libra was advised that CSW could not discuss patient until family was contacted. Call made to daughter-in-law Simone Rodenbeck [phone]) regarding Mr. Letitia Libra and confirmed they did contact this company to assist with placement, etc for patient. Mrs. Was informed that [employer] [facility]	[place], [facility] and [place] declined. Mrs. Haworth was again given facility choices and advised that patient was nearing readiness for discharge based on MD's 1/5 note and Mrs. Pall  expressed understanding.  4:55 pm - Talked again with Mr. Letitia Libra with [employer] [facility] and he explained his role with the patient/family. He was provided with names of the SNF's that accepted, declined (and reason if provided) and SNF's that did not respond. Mr. Letitia Libra was advised that I need to be ready to discharge patient when MD indicates that patient is medically ready for discharge, and Mr. Letitia Libra expressed understanding.     Expected Discharge Plan: Skilled Nursing Facility Barriers to Discharge: Other (comment) (PASRR number, facility selection, insurance authorization)  Expected Discharge Plan and Services Expected Discharge Plan: Skilled Nursing Facility In-house Referral: Clinical Social Work     Living arrangements for the past 2 months: Assisted Living Facility ([facility])                                      Social Determinants of Health (SDOH) Interventions  No SDOH interventions requested or needed at this time  Readmission Risk Interventions No flowsheet data found.

## 2020-07-04 DIAGNOSIS — E871 Hypo-osmolality and hyponatremia: Secondary | ICD-10-CM | POA: Diagnosis not present

## 2020-07-04 LAB — GLUCOSE, CAPILLARY
Glucose-Capillary: 104 mg/dL — ABNORMAL HIGH (ref 70–99)
Glucose-Capillary: 116 mg/dL — ABNORMAL HIGH (ref 70–99)

## 2020-07-04 LAB — BASIC METABOLIC PANEL
Anion gap: 9 (ref 5–15)
BUN: 23 mg/dL (ref 8–23)
CO2: 25 mmol/L (ref 22–32)
Calcium: 8.7 mg/dL — ABNORMAL LOW (ref 8.9–10.3)
Chloride: 96 mmol/L — ABNORMAL LOW (ref 98–111)
Creatinine, Ser: 0.68 mg/dL (ref 0.44–1.00)
GFR, Estimated: >60 mL/min (ref >60)
Glucose, Bld: 100 mg/dL — ABNORMAL HIGH (ref 70–99)
Potassium: 4.3 mmol/L (ref 3.5–5.1)
Sodium: 130 mmol/L — ABNORMAL LOW (ref 135–145)

## 2020-07-04 LAB — CBC
HCT: 32.2 % — ABNORMAL LOW (ref 36.0–46.0)
Hemoglobin: 10.7 g/dL — ABNORMAL LOW (ref 12.0–15.0)
MCH: 30.1 pg (ref 26.0–34.0)
MCHC: 33.2 g/dL (ref 30.0–36.0)
MCV: 90.7 fL (ref 80.0–100.0)
Platelets: 387 10*3/uL (ref 150–400)
RBC: 3.55 MIL/uL — ABNORMAL LOW (ref 3.87–5.11)
RDW: 13.5 % (ref 11.5–15.5)
WBC: 9.2 10*3/uL (ref 4.0–10.5)
nRBC: 0 % (ref 0.0–0.2)

## 2020-07-04 NOTE — Progress Notes (Signed)
PROGRESS NOTE   TUNISIA LANDGREBE  OJJ:[id number]    DOB: March 24, 1933    DOA: 06/25/2020  PCP: Patient, No Pcp Per   I have briefly reviewed patients previous medical records in [facility].  Chief Complaint  Patient presents with  . Fall/Hip Pain     Brief Narrative:  87 year old female with PMH of dementia, brought to the ED after found to have an unwitnessed fall and was found on the floor.  She complained of right hip pain.  In the ED, x-ray showed right hip fracture and CT head showed frontal scalp hematoma.  Lab work significant for sodium of 128.  Orthopedics consulted and s/p right hip hemiarthroplasty 06/26/2020.  Course complicated by postop delirium/sundowning, needed restraints, now off.  Postop acute blood loss anemia, s/p blood transfusion.  Treated for UTI.  CT abdomen and pelvis showed 9.7 cm deep subcutaneous hematoma laterally in the right hip and 4.6 cm left cystic adnexal process and fecal dilatation of rectum up to 7.8 cm.  Developed orthostatic hypotension 1/4, bolused with IVF.  Improved.  Patient medically ready for DC to SNF pending bed and insurance.  Communicated with patient's RN, Engineer, building services.   Assessment & Plan:  Principal Problem:   Closed right hip fracture ([place]) Active Problems:   Hyponatremia   Dementia (HCC)   Hip fracture (HCC)   Protein-calorie malnutrition, severe   Right hip fracture following unwitnessed fall Orthopedics consulted.  S/p right hip hemiarthroplasty 06/26/2020.  Head CT on admission showed small right frontal scalp hematoma but no acute intracranial process.  Orthopedics recommends aspirin 81 mg twice daily for VTE prophylaxis postop.  Weightbearing as tolerated.  Patient at this time is medically optimized for DC pending SNF bed.  Outpatient follow-up with orthopedics.  Hyponatremia Cause not clear. TSH: 3.38.  Cortisol level 29.2.  May have an element of SIADH related to pain.  Serum sodium stable in the high 120s.   Serum sodium stable in the 127-128 range for the last 3 days.  I reviewed her case in detail with nephrology on 1/6.  Urine osmolarity 521, urine sodium on 12/30: 81, serum osmolarity 285.  He suggested that she likely has some degree of SIADH and recommended fluid restriction and it may take several days to gradually improve but she seems to be stable and tolerating current sodium levels.  Outpatient follow-up at SNF.  Sodium improved to 130.  Constipation/fecal impaction CT abdomen and pelvis 1/4 showed fecal dilatation of the rectum up to 7.8 cm diameter.  As per RN attempt to digitally disimpact on 1/6, no fecal impaction noted.  Patient has had couple of large BMs yesterday and this morning.  Continue aggressive bowel regimen.  Had BM today.  Leukocytosis Secondary to stress margination from fall and from UTI.  Resolved.  Suspected UTI Urine culture post antibiotics was negative.  Has already received 5 days of IV ceftriaxone.  Discontinued antibiotics.  Elevated BP Blood pressures look okay.  Hypokalemia Replaced.  Magnesium 2.  Pelvic adnexal lesion CT abdomen pelvis 1/4 showed uterus unremarkable, 4.6 cm hypodense appearing left cystic adnexal process.  Ultrasound was recommended.  Transabdominal pelvic ultrasound shows bilateral ovarian cysts, measuring up to 5.1 cm in the left adnexa.  Recommend outpatient follow-up with GYN.  Near syncope and orthostatic hypotension Near syncope in the setting of orthostatic hypotension from acute blood loss anemia. S/p 3 units PRBC transfusion.  She was significantly orthostatic on 1/4 requiring IV fluid bolus and maintenance  IV fluids.  Still orthostatic on 1/5.  Denies orthostatic symptoms and no orthostatic vitals over last 2 days despite order, unsure if patient is not cooperating.  Bilateral lower extremity compression stockings.  Postop acute blood loss anemia Hemoglobin dropped to a low of 6.6.  S/p 3 units PRBC thus far.  Hemoglobin  stable/10.6 today.  Anemia panel appreciated, low normal B12 levels.  Likely multifactorial due to surgical blood loss and dilutional.  Also CT abdomen and pelvis showed 9.7 cm deep subcutaneous hematoma laterally in the hip.  Continue ferrous sulfate supplements.  As per orthopedics, hold aspirin for now.  No overt bleeding noted.  Continue to follow CBC.  Need to check with orthopedics at time of discharge regarding resuming aspirin.  History of dementia with behavioral disturbance Delirium precautions.  Had issues with hospital delirium.  Required restraints.  I discussed with patient's son at bedside 1/6.  He indicated that patient's mental status has improved and close to baseline.  She does have periods of confusion.  As per RNs discussion with patient's daughter today, mental status at baseline which includes ongoing intermittent confusion  Reactive thrombocytosis Resolved  Scalp hematoma Noted on CT head on admission.  Hyperglycemia A1c 5.4.  No diagnosis of diabetes.  Hypophosphatemia Replaced.  Body mass index is 18.8 kg/m.  Nutritional Status Nutrition Problem: Severe Malnutrition Etiology: chronic illness (dementia) Signs/Symptoms: severe fat depletion,moderate fat depletion,moderate muscle depletion,severe muscle depletion Interventions: Ensure Enlive (each supplement provides 350kcal and 20 grams of protein),Magic cup,MVI  Pressure Ulcer:     DVT prophylaxis: Place TED hose Start: 07/01/20 1449 Place and maintain sequential compression device Start: 06/29/20 0735 SCDs Start: 06/26/20 1135 Place TED hose Start: 06/26/20 1135 SCDs Start: 06/25/20 2308     Code Status: Full Code Family Communication:  Disposition:  Status is: Inpatient  Remains inpatient appropriate because:Inpatient level of care appropriate due to severity of illness   Dispo: The patient is from: Home              Anticipated d/c is to: SNF              Anticipated d/c date is: Pending  SNF bed              Patient currently is medically stable for DC pending SNF bed.        Consultants:   Orthopedics  Procedures:   As above  Antimicrobials:    Anti-infectives (From admission, onward)   Start     Dose/Rate Route Frequency Ordered Stop   06/26/20 1900  cefTRIAXone (ROCEPHIN) 1 g in sodium chloride 0.9 % 100 mL IVPB  Status:  Discontinued        1 g 200 mL/hr over 30 Minutes Intravenous Every 24 hours 06/26/20 1809 07/02/20 1916   06/26/20 1430  ceFAZolin (ANCEF) IVPB 2g/100 mL premix  Status:  Discontinued        2 g 200 mL/hr over 30 Minutes Intravenous Every 6 hours 06/26/20 1134 06/26/20 1811   06/26/20 0845  ceFAZolin (ANCEF) IVPB 2g/100 mL premix        2 g 200 mL/hr over 30 Minutes Intravenous On call to O.R. 06/26/20 0756 06/26/20 0916        Subjective:  Oriented to self and partly to place.  Denies complaints.  No pain reported.  As per RN, no acute issues noted.  Objective:   Vitals:   07/03/20 1939 07/04/20 0345 07/04/20 0857 07/04/20 1532  BP: 135/60 (!) 125/48 Marland Kitchen)  134/51 (!) 124/58  Pulse: 84 83 78 75  Resp: 17 15 16 14   Temp: 98.4 F (36.9 C) 97.6 F (36.4 C) 97.7 F (36.5 C) 97.6 F (36.4 C)  TempSrc: Oral Oral Oral Oral  SpO2: 96% 98% 96% 96%  Weight:      Height:        General exam: Elderly female, small built and frail lying comfortably supine in bed without distress. Respiratory system: Clear to auscultation.  No increased work of breathing. Cardiovascular system: S1 & S2 heard, RRR. No JVD, murmurs, rubs, gallops or clicks. No pedal edema. Gastrointestinal system: Abdomen is nondistended, soft and nontender. No organomegaly or masses felt. Normal bowel sounds heard. Central nervous system: Alert and oriented to self and partly to place. No focal neurological deficits. Extremities: Symmetric 5 x 5 power.  Left hip postop dressing clean, dry and intact Skin: No rashes, lesions or ulcers Psychiatry: Judgement and insight  appear overall impaired, consistent with her history of dementia. Mood & affect appropriate, no agitation    Data Reviewed:   I have personally reviewed following labs and imaging studies   CBC: Recent Labs  Lab 06/30/20 0449 06/30/20 1804 07/01/20 0913 07/02/20 0216 07/04/20 0319  WBC 6.8  --  9.2 9.2 9.2  NEUTROABS 4.4  --  7.6 6.3  --   HGB 7.7*   < > 13.0 10.6* 10.7*  HCT 23.3*   < > 37.2 30.2* 32.2*  MCV 90.7  --  88.4 88.0 90.7  PLT 256  --  418* 311 387   < > = values in this interval not displayed.    Basic Metabolic Panel: Recent Labs  Lab 06/30/20 0449 07/01/20 0913 07/02/20 0216 07/03/20 0136 07/04/20 0319  NA 130* 127* 128* 128* 130*  K 4.3 3.4* 4.6 4.1 4.3  CL 96* 90* 94* 94* 96*  CO2 25 25 25 25 25   GLUCOSE 96 135* 94 94 100*  BUN 18 15 25* 22 23  CREATININE 0.76 0.83 0.67 0.67 0.68  CALCIUM 8.2* 9.3 8.2* 8.5* 8.7*  MG 2.0 2.2 2.0  --   --   PHOS 3.0 2.8 2.6  --   --     Liver Function Tests: Recent Labs  Lab 06/30/20 0449 07/01/20 0913 07/02/20 0216  AST 24 29 22   ALT 15 17 14   ALKPHOS 39 57 52  BILITOT 0.6 1.1 0.8  PROT 4.8* 6.6 5.0*  ALBUMIN 2.3* 3.1* 2.4*    CBG: Recent Labs  Lab 07/03/20 1630 07/04/20 0028 07/04/20 0631  GLUCAP 162* 116* 104*    Microbiology Studies:   Recent Results (from the past 240 hour(s))  Resp Panel by RT-PCR (Flu A&B, Covid) Nasopharyngeal Swab     Status: None   Collection Time: 06/25/20  8:18 PM   Specimen: Nasopharyngeal Swab; Nasopharyngeal(NP) swabs in vial transport medium  Result Value Ref Range Status   SARS Coronavirus 2 by RT PCR NEGATIVE NEGATIVE Final    Comment: (NOTE) SARS-CoV-2 target nucleic acids are NOT DETECTED.  The SARS-CoV-2 RNA is generally detectable in upper respiratory specimens during the acute phase of infection. The lowest concentration of SARS-CoV-2 viral copies this assay can detect is 138 copies/mL. A negative result does not preclude SARS-Cov-2 infection and  should not be used as the sole basis for treatment or other patient management decisions. A negative result may occur with  improper specimen collection/handling, submission of specimen other than nasopharyngeal swab, presence of viral mutation(s) within the areas  targeted by this assay, and inadequate number of viral copies(<138 copies/mL). A negative result must be combined with clinical observations, patient history, and epidemiological information. The expected result is Negative.  Fact Sheet for Patients:  BloggerCourse.com  Fact Sheet for Healthcare Providers:  SeriousBroker.it  This test is no t yet approved or cleared by the [place] FDA and  has been authorized for detection and/or diagnosis of SARS-CoV-2 by FDA under an Emergency Use Authorization (EUA). This EUA will remain  in effect (meaning this test can be used) for the duration of the COVID-19 declaration under Section 564(b)(1) of the Act, 21 U.S.C.section 360bbb-3(b)(1), unless the authorization is terminated  or revoked sooner.       Influenza A by PCR NEGATIVE NEGATIVE Final   Influenza B by PCR NEGATIVE NEGATIVE Final    Comment: (NOTE) The Xpert Xpress SARS-CoV-2/FLU/RSV plus assay is intended as an aid in the diagnosis of influenza from Nasopharyngeal swab specimens and should not be used as a sole basis for treatment. Nasal washings and aspirates are unacceptable for Xpert Xpress SARS-CoV-2/FLU/RSV testing.  Fact Sheet for Patients: BloggerCourse.com  Fact Sheet for Healthcare Providers: SeriousBroker.it  This test is not yet approved or cleared by the [place] FDA and has been authorized for detection and/or diagnosis of SARS-CoV-2 by FDA under an Emergency Use Authorization (EUA). This EUA will remain in effect (meaning this test can be used) for the duration of the COVID-19 declaration  under Section 564(b)(1) of the Act, 21 U.S.C. section 360bbb-3(b)(1), unless the authorization is terminated or revoked.  Performed at [facility], [address], [place], [place] [address]   Surgical pcr screen     Status: None   Collection Time: 06/26/20  8:01 AM   Specimen: Nasal Mucosa; Nasal Swab  Result Value Ref Range Status   MRSA, PCR NEGATIVE NEGATIVE Final   Staphylococcus aureus NEGATIVE NEGATIVE Final    Comment: (NOTE) The Xpert SA Assay (FDA approved for NASAL specimens in patients 57 years of age and older), is one component of a comprehensive surveillance program. It is not intended to diagnose infection nor to guide or monitor treatment. Performed at [facility], [address] [place], [place] [address]   Culture, Urine     Status: None   Collection Time: 06/27/20 11:29 AM   Specimen: Urine, Clean Catch  Result Value Ref Range Status   Specimen Description URINE, CLEAN CATCH  Final   Special Requests NONE  Final   Culture   Final    NO GROWTH Performed at [facility], [address] [place], [place] [address]    Report Status 06/28/2020 FINAL  Final     Radiology Studies:  No results found.   Scheduled Meds:   . feeding supplement  237 mL Oral TID BM  . ferrous sulfate  325 mg Oral TID PC  . multivitamin with minerals  1 tablet Oral Daily  . polyethylene glycol  17 g Oral BID  . senna-docusate  1 tablet Oral BID    Continuous Infusions:   . methocarbamol (ROBAXIN) IV       LOS: 9 days     Marcellus Scott, MD, [place], [facility]. [facility]    To contact the attending provider between 7A-7P or the covering provider during after hours 7P-7A, please log into the web site [email or web address] and access using universal [place] password for that web site. If you do not have the password, please call  the hospital operator.  07/04/2020, 5:51 PM

## 2020-07-04 NOTE — Care Management Important Message (Signed)
Important Message  Patient Details  Name: Suzanne Hall MRN: [id number] Date of Birth: [birth year]   Medicare Important Message Given:  Yes     Darchelle Nunes P Kara Mierzejewski 07/04/2020, 12:55 PM

## 2020-07-04 NOTE — Progress Notes (Signed)
Physical Therapy Treatment Patient Details Name: Suzanne Hall MRN: [id number] DOB: 1932/11/13 Today's Date: 07/04/2020    History of Present Illness The pt is an 87 yo female presenting with R hip fx after an unwitnessed fall, and is now s/p R THA. CT of head also revealed frontal hematoma. PMH includes: CKD, dementia, HLD, and osteopenia.    PT Comments    Pt supine on arrival, sleeping but awoken and initially agreeable to therapy session. Pt remained drowsy throughout session, able to perform bed mobility with min guard, however once seated EOB pt became more agitated/resistant to participation and returned to supine despite max encouragement for transfer training. Pt performed supine A/AAROM therapeutic exercises with fair tolerance, pt with good quad contraction but difficulty with quality of movements 2/2 cognition. Pt oriented to self only and unable to recall posterior hip precautions, will need reinforcement for compliance. Bed left up in chair position for improved hemodynamics/pulmonary clearance and blinds opened to encourage daytime alertness and normalize sleep/wake cycle. Pt continues to benefit from PT services to progress toward functional mobility goals. D/C recs below remain appropriate.   Follow Up Recommendations  SNF;Supervision/Assistance - 24 hour     Equipment Recommendations  Other (comment) (TBD next venue; anticipate RW and 3 in 1)    Recommendations for Other Services       Precautions / Restrictions Precautions Precautions: Posterior Hip;Fall Precaution Booklet Issued: Yes (comment) Precaution Comments: handout hanging in room; no recall of precautions Restrictions Weight Bearing Restrictions: Yes RLE Weight Bearing: Weight bearing as tolerated    Mobility  Bed Mobility Overal bed mobility: Needs Assistance Bed Mobility: Supine to Sit;Sit to Supine     Supine to sit: Min guard Sit to supine: Min guard   General bed mobility comments: pt  agreeable to attempt EOB transition however once EOB pt deferring to attempt stand transfer despite max encouragement  Transfers                    Ambulation/Gait                 Stairs             Wheelchair Mobility    Modified Rankin (Stroke Patients Only)       Balance Overall balance assessment: Needs assistance Sitting-balance support: Single extremity supported;Feet supported Sitting balance-Leahy Scale: Fair Sitting balance - Comments: UE support and Supervision for static sitting                                    Cognition Arousal/Alertness:  (drowsy) Behavior During Therapy: Anxious;Flat affect Overall Cognitive Status: History of cognitive impairments - at baseline                                 General Comments: dementia at baseline, kept eyes closed for most of session. Repetitive with questions, becomes agitated with increased encouragement to get out of bed. poor short term memory, not oriented to place/situation/time      Exercises Total Joint Exercises Ankle Circles/Pumps: AROM;Strengthening;Both;10 reps;Supine Heel Slides: AROM;Strengthening;Both;Supine;15 reps Hip ABduction/ADduction: AAROM;Strengthening;Both;10 reps;Supine General Exercises - Lower Extremity Short Arc Quad: AROM;Strengthening;Right;Supine;15 reps    General Comments General comments (skin integrity, edema, etc.): significant bruising noted to R hip, no new drainage observed; pt unable to demo comprehension of education regarding precautions; VSS on  RA      Pertinent Vitals/Pain Pain Assessment: Faces Faces Pain Scale: Hurts little more Pain Location: R hip with movement Pain Descriptors / Indicators: Grimacing Pain Intervention(s): Monitored during session;Premedicated before session;Repositioned    Home Living                      Prior Function            PT Goals (current goals can now be found in the care  plan section) Acute Rehab PT Goals Patient Stated Goal: pt unable to state; to go back to bed PT Goal Formulation: With patient Time For Goal Achievement: 07/11/20 Potential to Achieve Goals: Fair Progress towards PT goals: Progressing toward goals    Frequency    Min 2X/week      PT Plan Current plan remains appropriate    Co-evaluation              AM-PAC PT "6 Clicks" Mobility   Outcome Measure  Help needed turning from your back to your side while in a flat bed without using bedrails?: A Little Help needed moving from lying on your back to sitting on the side of a flat bed without using bedrails?: A Little Help needed moving to and from a bed to a chair (including a wheelchair)?: A Little Help needed standing up from a chair using your arms (e.g., wheelchair or bedside chair)?: A Lot Help needed to walk in hospital room?: A Lot Help needed climbing 3-5 steps with a railing? : Total 6 Click Score: 14    End of Session   Activity Tolerance: Patient limited by lethargy Patient left: in bed;with bed alarm set;with call bell/phone within reach (bed in chair position, lights on and TV on) Nurse Communication: Mobility status PT Visit Diagnosis: Other abnormalities of gait and mobility (R26.89);Muscle weakness (generalized) (M62.81) Pain - Right/Left: Right Pain - part of body: Hip     Time: 3825-0539 PT Time Calculation (min) (ACUTE ONLY): 22 min  Charges:  $Therapeutic Exercise: 8-22 mins                     Suzanne Hall P., PTA Acute Rehabilitation Services Pager: [phone] Office: [phone]   Suzanne Hall 07/04/2020, 11:01 AM

## 2020-07-04 NOTE — Plan of Care (Signed)

## 2020-07-04 NOTE — TOC Progression Note (Signed)
Transition of Care [facility]) - Progression Note    Patient Details  Name: Suzanne Hall MRN: [id number] Date of Birth: 08/12/1932  [facility] CM/SW Contact  Okey Dupre Lazaro Arms, LCSW Phone Number: 07/04/2020, 7:50 PM  Clinical Narrative:  Received call from [facility] with [employer] [phone]) regarding facility choices for patient: [place] and [facility]. These facilities will be contacted regarding patient.     Expected Discharge Plan: Skilled Nursing Facility Barriers to Discharge: Other (comment) (PASRR number, facility selection, insurance authorization)  Expected Discharge Plan and Services Expected Discharge Plan: Skilled Nursing Facility In-house Referral: Clinical Social Work     Living arrangements for the past 2 months: Assisted Living Facility ([facility])                                       Social Determinants of Health (SDOH) Interventions    Readmission Risk Interventions No flowsheet data found.

## 2020-07-05 DIAGNOSIS — S72001A Fracture of unspecified part of neck of right femur, initial encounter for closed fracture: Secondary | ICD-10-CM | POA: Diagnosis not present

## 2020-07-05 LAB — BASIC METABOLIC PANEL
Anion gap: 9 (ref 5–15)
BUN: 29 mg/dL — ABNORMAL HIGH (ref 8–23)
CO2: 25 mmol/L (ref 22–32)
Calcium: 8.7 mg/dL — ABNORMAL LOW (ref 8.9–10.3)
Chloride: 96 mmol/L — ABNORMAL LOW (ref 98–111)
Creatinine, Ser: 0.72 mg/dL (ref 0.44–1.00)
GFR, Estimated: >60 mL/min (ref >60)
Glucose, Bld: 91 mg/dL (ref 70–99)
Potassium: 4.4 mmol/L (ref 3.5–5.1)
Sodium: 130 mmol/L — ABNORMAL LOW (ref 135–145)

## 2020-07-05 LAB — GLUCOSE, CAPILLARY
Glucose-Capillary: 87 mg/dL (ref 70–99)
Glucose-Capillary: 93 mg/dL (ref 70–99)

## 2020-07-05 MED ORDER — ASPIRIN EC 81 MG PO TBEC
81.0000 mg | DELAYED_RELEASE_TABLET | Freq: Two times a day (BID) | ORAL | Status: DC
  Administered 2020-07-05 – 2020-07-07 (×4): 81 mg via ORAL
  Filled 2020-07-05 (×4): qty 1

## 2020-07-05 NOTE — [place] FL2 (Signed)
[place] MEDICAID FL2 LEVEL OF CARE SCREENING TOOL     IDENTIFICATION  Patient Name: Suzanne Hall Birthdate: [birth year] Sex: female Admission Date (Current Location): 06/25/2020  [facility] and [place] Number:  Producer, television/film/video and Address:  [facility], [address], [place], [place] [address]      Provider Number: [id number]  Attending Physician Name and Address:  Dorcas Carrow, MD  Relative Name and Phone Number:  Shaunette Gassner - son - [phone]    Current Level of Care: Hospital Recommended Level of Care: Skilled Nursing Facility Prior Approval Number:    Date Approved/Denied:   PASRR Number: [id number]  Discharge Plan: SNF    Current Diagnoses: Patient Active Problem List   Diagnosis Date Noted  . Protein-calorie malnutrition, severe 07/03/2020  . Closed right hip fracture (HCC) 06/25/2020  . Hyponatremia 06/25/2020  . Dementia (HCC) 06/25/2020  . Hip fracture (HCC) 06/25/2020    Orientation RESPIRATION BLADDER Height & Weight     Self  Normal Incontinent Weight: 113 lb (51.3 kg) Height:  5\' 5"  (165.1 cm)  BEHAVIORAL SYMPTOMS/MOOD NEUROLOGICAL BOWEL NUTRITION STATUS      Continent Diet (Regular)  AMBULATORY STATUS COMMUNICATION OF NEEDS Skin   Total Care (Patient was unable to ambulate with PT during evaluation on 12/31) Verbally Other (Comment) (Incision hip with hydrocolloid dressing; Ecchymosis right hip)                       Personal Care Assistance Level of Assistance  Bathing,Feeding,Dressing Bathing Assistance: Maximum assistance Feeding assistance: Limited assistance (Assistance with set-up) Dressing Assistance: Maximum assistance Total Care Assistance: Maximum assistance   Functional Limitations Info  Sight,Hearing,Speech Sight Info: Adequate Hearing Info: Adequate Speech Info: Adequate    SPECIAL CARE FACTORS FREQUENCY  PT (By licensed PT),OT (By licensed OT),Speech therapy     PT Frequency:  Evaluation 12/31. PT at SNF eval and treat, a minimum of 5 days per week OT Frequency: Evaluation 06/29/20. OT at SNF eval and treat, a minimum of 5 days per week     Speech Therapy Frequency: Evaluation 06/28/20      Contractures Contractures Info: Not present    Additional Factors Info  Code Status,Allergies Code Status Info: Full Allergies Info: No known allergies           Current Medications (07/05/2020):  This is the current hospital active medication list Current Facility-Administered Medications  Medication Dose Route Frequency Provider Last Rate Last Admin  . acetaminophen (TYLENOL) tablet 325-650 mg  325-650 mg Oral Q6H PRN 09/02/2020, PA-C   325 mg at 07/04/20 1346  . alum & mag hydroxide-simeth (MAALOX/MYLANTA) 200-200-20 MG/5ML suspension 15 mL  15 mL Oral Q4H PRN 01-04-2001, PA-C      . aspirin EC tablet 81 mg  81 mg Oral BID Tommie Ard, MD      . bisacodyl (DULCOLAX) suppository 10 mg  10 mg Rectal Daily PRN Dorcas Carrow, PA-C   10 mg at 06/30/20 1637  . diphenhydrAMINE (BENADRYL) 12.5 MG/5ML elixir 12.5-25 mg  12.5-25 mg Oral Q4H PRN 07-28-1984, PA-C      . feeding supplement (ENSURE ENLIVE / ENSURE PLUS) liquid 237 mL  237 mL Oral TID BM Sheikh, Omair Latif, DO   237 mL at 07/05/20 0936  . ferrous sulfate tablet 325 mg  325 mg Oral TID PC 09/02/20, PA-C   325 mg at 07/05/20 09/02/20  .  hydrALAZINE (APRESOLINE) injection 5 mg  5 mg Intravenous Q4H PRN Tommie Ard, PA-C      . magnesium citrate solution 1 Bottle  1 Bottle Oral Once PRN Tommie Ard, PA-C      . menthol-cetylpyridinium (CEPACOL) lozenge 3 mg  1 lozenge Oral PRN Tommie Ard, PA-C       Or  . phenol (CHLORASEPTIC) mouth spray 1 spray  1 spray Mouth/Throat PRN Tommie Ard, PA-C      . methocarbamol (ROBAXIN) tablet 500 mg  500 mg Oral Q6H PRN Tommie Ard, PA-C       Or  . methocarbamol (ROBAXIN) 500 mg in dextrose 5 % 50 mL IVPB  500 mg  Intravenous Q6H PRN Tommie Ard, PA-C      . multivitamin with minerals tablet 1 tablet  1 tablet Oral Daily Marguerita Merles Medina, DO   1 tablet at 07/05/20 7672  . ondansetron (ZOFRAN) tablet 4 mg  4 mg Oral Q6H PRN Dennie Bible R, PA-C       Or  . ondansetron [facility]) injection 4 mg  4 mg Intravenous Q6H PRN Tommie Ard, PA-C   4 mg at 06/29/20 1049  . polyethylene glycol (MIRALAX / GLYCOLAX) packet 17 g  17 g Oral BID Tommie Ard, PA-C   17 g at 07/05/20 0947  . senna-docusate (Senokot-S) tablet 1 tablet  1 tablet Oral BID Marguerita Merles Livingston, DO   1 tablet at 07/05/20 0962  . traMADol (ULTRAM) tablet 50-100 mg  50-100 mg Oral Q6H PRN Tommie Ard, PA-C   50 mg at 07/01/20 1316     Discharge Medications: Please see discharge summary for a list of discharge medications.  Relevant Imaging Results:  Relevant Lab Results:   Additional Information ss#[id number]  Carmina Miller, LCSWA

## 2020-07-05 NOTE — Plan of Care (Signed)

## 2020-07-05 NOTE — TOC Progression Note (Signed)
Transition of Care [facility]) - Progression Note    Patient Details  Name: Suzanne Hall MRN: [id number] Date of Birth: [birth year]  Transition of Care [facility]) CM/SW Contact  Carmina Miller, LCSWA Phone Number: 07/05/2020, 11:28 AM  Clinical Narrative:    CSW spoke with pt's daughter in law via phone about current SNF choices. CSW relayed that [facility] can take pt on Monday.    Expected Discharge Plan: Skilled Nursing Facility Barriers to Discharge: Other (comment) (PASRR number, facility selection, insurance authorization)  Expected Discharge Plan and Services Expected Discharge Plan: Skilled Nursing Facility In-house Referral: Clinical Social Work     Living arrangements for the past 2 months: Assisted Living Facility ([facility])                                       Social Determinants of Health (SDOH) Interventions    Readmission Risk Interventions No flowsheet data found.

## 2020-07-05 NOTE — Progress Notes (Signed)
The patient's daughter has requested the patient to have linen change,  Care provided by NT x2  The patient's daughter has asked about the patient not having a dressing to her right hip.  The incision appears to be healing well, edges intact, bruising noted.  The patient has been removing the dressing on the right hip when placed

## 2020-07-05 NOTE — Progress Notes (Signed)
PROGRESS NOTE    MAISEY DEANDRADE  SUP:[id number] DOB: 1933/06/25 DOA: 06/25/2020 PCP: Patient, No Pcp Per    Brief Narrative:  87 year old female with history of dementia brought from home with unwitnessed fall and she was found on the floor.  She complained of right hip pain.  In the emergency room, x-ray showed right hip fracture and CT head showed frontal scalp hematoma.  Sodium 128.  Patient underwent right hip Hemi arthroplasty on 12/30.  Patient developed postop acute blood loss anemia, sundowning, agitation needing restraints and now all improved.   Assessment & Plan:   Principal Problem:   Closed right hip fracture (HCC) Active Problems:   Hyponatremia   Dementia (HCC)   Hip fracture (HCC)   Protein-calorie malnutrition, severe  Closed right hip fracture, mechanical: Status post right Hemi hip 12/30 Weightbearing as tolerated Currently pain managed with Tylenol, not using any opiates Aspirin 81 mg twice daily for DVT prophylaxis Medically stable to transfer to skilled level of care  Hyponatremia: Stabilized.  TSH normal.  Cortisol level normal.  Constipation/fecal impaction: Treated with laxatives with improvement.  Suspected UTI: Improved.  Cultures negative.  Treated with 5 days of IV ceftriaxone.  Postop acute blood loss anemia: Hemoglobin dropped to 6.6-3 units of PRBC transfusion with appropriate response and hemoglobin has stabilized. CT scan abdomen pelvis showed 9.7 cm deep subcutaneous hematoma laterally in the hip consistent with long bone fracture. Continue iron supplements.  History of dementia with behavioral disturbances: Patient developed delirium initially requiring restraints.  Currently mental status improved. Mental status improved and normalized. Avoiding opiates and benzos.  Tylenol for pain relief.  Severe protein calorie malnutrition: Nutrition Status: Nutrition Problem: Severe Malnutrition Etiology: chronic illness  (dementia) Signs/Symptoms: severe fat depletion,moderate fat depletion,moderate muscle depletion,severe muscle depletion Interventions: Ensure Enlive (each supplement provides 350kcal and 20 grams of protein),Magic cup,MVI     DVT prophylaxis: Place TED hose Start: 07/01/20 1449 Place and maintain sequential compression device Start: 06/29/20 0735 SCDs Start: 06/26/20 1135 Place TED hose Start: 06/26/20 1135 SCDs Start: 06/25/20 2308   Code Status: Full code Family Communication: None Disposition Plan: Status is: Inpatient  Remains inpatient appropriate because:Unsafe d/c plan   Dispo: The patient is from: Home              Anticipated d/c is to: SNF              Anticipated d/c date is: 1 day              Patient currently is medically stable to d/c.         Consultants:  Orthopedics  Procedures:   Right hip IM nailing 12/30  Antimicrobials:   Finished course   Subjective: No overnight events. Denies any complaints .   Objective: Vitals:   07/04/20 1532 07/04/20 1956 07/05/20 0338 07/05/20 0819  BP: (!) 124/58 (!) 122/47 135/75 130/60  Pulse: 75 85 75 72  Resp: 14 17 16 16   Temp: 97.6 F (36.4 C) 98.5 F (36.9 C) 97.7 F (36.5 C) 97.9 F (36.6 C)  TempSrc: Oral Oral Oral Oral  SpO2: 96% 97% (!) 87% 95%  Weight:      Height:        Intake/Output Summary (Last 24 hours) at 07/05/2020 0825 Last data filed at 07/04/2020 0900 Gross per 24 hour  Intake 120 ml  Output 2 ml  Net 118 ml   Filed Weights   06/25/20 1940 06/26/20 0819  Weight: 51.3 kg  51.3 kg    Examination:  General exam: Elderly female.  Lying in bed.  Frail and debilitated.  Not in any discomfort. Eating breakfast. Respiratory system: Bilateral clear. Cardiovascular system: S1 & S2 heard, RRR. No JVD, murmurs, rubs, gallops or clicks. No pedal edema. Gastrointestinal system: Soft.  Nontender. Central nervous system: Alert and oriented x2.  Mood is normal.  Affect is flat. Right  lateral thigh incision looks clean and dry. Ecchymotic but no underlying collection.    Data Reviewed: I have personally reviewed following labs and imaging studies  CBC: Recent Labs  Lab 06/28/20 0928 06/29/20 0149 06/30/20 0449 06/30/20 1804 07/01/20 0913 07/02/20 0216 07/04/20 0319  WBC 16.2* 10.6* 6.8  --  9.2 9.2 9.2  NEUTROABS 13.6* 7.6 4.4  --  7.6 6.3  --   HGB 7.9* 6.6* 7.7* 11.5* 13.0 10.6* 10.7*  HCT 24.0* 19.8* 23.3* 33.3* 37.2 30.2* 32.2*  MCV 92.0 91.7 90.7  --  88.4 88.0 90.7  PLT 249 218 256  --  418* 311 387   Basic Metabolic Panel: Recent Labs  Lab 06/29/20 0149 06/30/20 0449 07/01/20 0913 07/02/20 0216 07/03/20 0136 07/04/20 0319 07/05/20 0211  NA 131* 130* 127* 128* 128* 130* 130*  K 4.5 4.3 3.4* 4.6 4.1 4.3 4.4  CL 103 96* 90* 94* 94* 96* 96*  CO2 22 25 25 25 25 25 25   GLUCOSE 96 96 135* 94 94 100* 91  BUN 31* 18 15 25* 22 23 29*  CREATININE 0.73 0.76 0.83 0.67 0.67 0.68 0.72  CALCIUM 8.0* 8.2* 9.3 8.2* 8.5* 8.7* 8.7*  MG 2.1 2.0 2.2 2.0  --   --   --   PHOS 1.5* 3.0 2.8 2.6  --   --   --    GFR: Estimated Creatinine Clearance: 41.6 mL/min (by C-G formula based on SCr of 0.72 mg/dL). Liver Function Tests: Recent Labs  Lab 06/29/20 0149 06/30/20 0449 07/01/20 0913 07/02/20 0216  AST 31 24 29 22   ALT 17 15 17 14   ALKPHOS 38 39 57 52  BILITOT 0.9 0.6 1.1 0.8  PROT 4.5* 4.8* 6.6 5.0*  ALBUMIN 2.3* 2.3* 3.1* 2.4*   No results for input(s): LIPASE, AMYLASE in the last 168 hours. No results for input(s): AMMONIA in the last 168 hours. Coagulation Profile: No results for input(s): INR, PROTIME in the last 168 hours. Cardiac Enzymes: No results for input(s): CKTOTAL, CKMB, CKMBINDEX, TROPONINI in the last 168 hours. BNP (last 3 results) No results for input(s): PROBNP in the last 8760 hours. HbA1C: No results for input(s): HGBA1C in the last 72 hours. CBG: Recent Labs  Lab 07/03/20 1630 07/04/20 0028 07/04/20 0631 07/05/20 0045  07/05/20 0702  GLUCAP 162* 116* 104* 87 93   Lipid Profile: No results for input(s): CHOL, HDL, LDLCALC, TRIG, CHOLHDL, LDLDIRECT in the last 72 hours. Thyroid Function Tests: No results for input(s): TSH, T4TOTAL, FREET4, T3FREE, THYROIDAB in the last 72 hours. Anemia Panel: No results for input(s): VITAMINB12, FOLATE, FERRITIN, TIBC, IRON, RETICCTPCT in the last 72 hours. Sepsis Labs: No results for input(s): PROCALCITON, LATICACIDVEN in the last 168 hours.  Recent Results (from the past 240 hour(s))  Resp Panel by RT-PCR (Flu A&B, Covid) Nasopharyngeal Swab     Status: None   Collection Time: 06/25/20  8:18 PM   Specimen: Nasopharyngeal Swab; Nasopharyngeal(NP) swabs in vial transport medium  Result Value Ref Range Status   SARS Coronavirus 2 by RT PCR NEGATIVE NEGATIVE Final  Comment: (NOTE) SARS-CoV-2 target nucleic acids are NOT DETECTED.  The SARS-CoV-2 RNA is generally detectable in upper respiratory specimens during the acute phase of infection. The lowest concentration of SARS-CoV-2 viral copies this assay can detect is 138 copies/mL. A negative result does not preclude SARS-Cov-2 infection and should not be used as the sole basis for treatment or other patient management decisions. A negative result may occur with  improper specimen collection/handling, submission of specimen other than nasopharyngeal swab, presence of viral mutation(s) within the areas targeted by this assay, and inadequate number of viral copies(<138 copies/mL). A negative result must be combined with clinical observations, patient history, and epidemiological information. The expected result is Negative.  Fact Sheet for Patients:  BloggerCourse.com  Fact Sheet for Healthcare Providers:  SeriousBroker.it  This test is no t yet approved or cleared by the [place] FDA and  has been authorized for detection and/or diagnosis of SARS-CoV-2  by FDA under an Emergency Use Authorization (EUA). This EUA will remain  in effect (meaning this test can be used) for the duration of the COVID-19 declaration under Section 564(b)(1) of the Act, 21 U.S.C.section 360bbb-3(b)(1), unless the authorization is terminated  or revoked sooner.       Influenza A by PCR NEGATIVE NEGATIVE Final   Influenza B by PCR NEGATIVE NEGATIVE Final    Comment: (NOTE) The Xpert Xpress SARS-CoV-2/FLU/RSV plus assay is intended as an aid in the diagnosis of influenza from Nasopharyngeal swab specimens and should not be used as a sole basis for treatment. Nasal washings and aspirates are unacceptable for Xpert Xpress SARS-CoV-2/FLU/RSV testing.  Fact Sheet for Patients: BloggerCourse.com  Fact Sheet for Healthcare Providers: SeriousBroker.it  This test is not yet approved or cleared by the [place] FDA and has been authorized for detection and/or diagnosis of SARS-CoV-2 by FDA under an Emergency Use Authorization (EUA). This EUA will remain in effect (meaning this test can be used) for the duration of the COVID-19 declaration under Section 564(b)(1) of the Act, 21 U.S.C. section 360bbb-3(b)(1), unless the authorization is terminated or revoked.  Performed at [facility], [address] [place], [place] [address]   Surgical pcr screen     Status: None   Collection Time: 06/26/20  8:01 AM   Specimen: Nasal Mucosa; Nasal Swab  Result Value Ref Range Status   MRSA, PCR NEGATIVE NEGATIVE Final   Staphylococcus aureus NEGATIVE NEGATIVE Final    Comment: (NOTE) The Xpert SA Assay (FDA approved for NASAL specimens in patients 38 years of age and older), is one component of a comprehensive surveillance program. It is not intended to diagnose infection nor to guide or monitor treatment. Performed at [facility], [address] [place], [place] [address]   Culture, Urine     Status:  None   Collection Time: 06/27/20 11:29 AM   Specimen: Urine, Clean Catch  Result Value Ref Range Status   Specimen Description URINE, CLEAN CATCH  Final   Special Requests NONE  Final   Culture   Final    NO GROWTH Performed at [facility], [address] [place], [place] [address]    Report Status 06/28/2020 FINAL  Final         Radiology Studies: No results found.      Scheduled Meds: . aspirin EC  81 mg Oral BID  . feeding supplement  237 mL Oral TID BM  . ferrous sulfate  325 mg Oral TID PC  .  multivitamin with minerals  1 tablet Oral Daily  . polyethylene glycol  17 g Oral BID  . senna-docusate  1 tablet Oral BID   Continuous Infusions: . methocarbamol (ROBAXIN) IV       LOS: 10 days    Time spent: 25 minutes     Dorcas CarrowKuber Wilfred Siverson, MD [facility] Pager [phone]

## 2020-07-05 NOTE — Progress Notes (Signed)
The patient's daughter is present and is asking to speak to a Case Manager in regards to discharging to a nursing facility.  I provided the information written as of yesterday, so that she would be updated.  I have left a message with Morrie Sheldon, Case Manager.

## 2020-07-06 DIAGNOSIS — S72001A Fracture of unspecified part of neck of right femur, initial encounter for closed fracture: Secondary | ICD-10-CM | POA: Diagnosis not present

## 2020-07-06 LAB — SARS CORONAVIRUS 2 (TAT 6-24 HRS): SARS Coronavirus 2: NEGATIVE

## 2020-07-06 LAB — GLUCOSE, CAPILLARY
Glucose-Capillary: 85 mg/dL (ref 70–99)
Glucose-Capillary: 94 mg/dL (ref 70–99)

## 2020-07-06 NOTE — Progress Notes (Signed)
PROGRESS NOTE    KRISTEENA MEINEKE  WUJ:[id number] DOB: 1932-12-12 DOA: 06/25/2020 PCP: Patient, No Pcp Per    Brief Narrative:  87 year old female with history of dementia brought from home with unwitnessed fall and she was found on the floor.  She complained of right hip pain.  In the emergency room, x-ray showed right hip fracture and CT head showed frontal scalp hematoma.  Sodium 128.  Patient underwent right hip Hemi arthroplasty on 12/30.  Patient developed postop acute blood loss anemia, sundowning, agitation needing restraints and now all improved. 1/9, patient remains in clinically stable condition waiting for skilled nursing bed.   Assessment & Plan:   Principal Problem:   Closed right hip fracture (HCC) Active Problems:   Hyponatremia   Dementia (HCC)   Hip fracture (HCC)   Protein-calorie malnutrition, severe  Closed right hip fracture, mechanical: Status post right Hemi hip 12/30 Weightbearing as tolerated Currently pain managed with Tylenol, not using any opiates Aspirin 81 mg twice daily for DVT prophylaxis Medically stable to transfer to skilled level of care  Hyponatremia: Stabilized.  TSH normal.  Cortisol level normal.  Constipation/fecal impaction: Treated with laxatives with improvement.  Suspected UTI: Improved.  Cultures negative.  Treated with 5 days of IV ceftriaxone.  Postop acute blood loss anemia: Hemoglobin dropped to 6.6-3 units of PRBC transfusion with appropriate response and hemoglobin has stabilized. CT scan abdomen pelvis showed 9.7 cm deep subcutaneous hematoma laterally in the hip consistent with long bone fracture. Continue iron supplements.  History of dementia with behavioral disturbances: Patient developed delirium initially requiring restraints.  Currently mental status improved. Mental status improved and normalized. Avoiding opiates and benzos.  Tylenol for pain relief.  Severe protein calorie malnutrition: Nutrition  Status: Nutrition Problem: Severe Malnutrition Etiology: chronic illness (dementia) Signs/Symptoms: severe fat depletion,moderate fat depletion,moderate muscle depletion,severe muscle depletion Interventions: Ensure Enlive (each supplement provides 350kcal and 20 grams of protein),Magic cup,MVI     DVT prophylaxis: Place TED hose Start: 07/01/20 1449 Place and maintain sequential compression device Start: 06/29/20 0735 SCDs Start: 06/26/20 1135 Place TED hose Start: 06/26/20 1135 SCDs Start: 06/25/20 2308   Code Status: Full code Family Communication: None Disposition Plan: Status is: Inpatient  Remains inpatient appropriate because:Unsafe d/c plan   Dispo: The patient is from: Home              Anticipated d/c is to: SNF              Anticipated d/c date is: 1 day              Patient currently is medically stable to d/c. Stable to transfer to a skilled level of care when bed is available.        Consultants:  Orthopedics  Procedures:   Right hip IM nailing 12/30  Antimicrobials:   Finished course   Subjective: No overnight events.  When asked she answers "I am doing well and I have no problems"  Objective: Vitals:   07/05/20 1527 07/05/20 1939 07/06/20 0300 07/06/20 0801  BP: 101/79 (!) 120/49 (!) 129/56 (!) 145/52  Pulse: 78 84 80 88  Resp: 14 16 17 16   Temp: 98 F (36.7 C) 97.6 F (36.4 C) 99 F (37.2 C) 98 F (36.7 C)  TempSrc: Oral Oral Oral Oral  SpO2: 98% 96% 98% 100%  Weight:      Height:        Intake/Output Summary (Last 24 hours) at 07/06/2020 1032 Last data filed  at 07/06/2020 0900 Gross per 24 hour  Intake 240 ml  Output --  Net 240 ml   Filed Weights   06/25/20 1940 06/26/20 0819  Weight: 51.3 kg 51.3 kg    Examination:  General exam: Elderly female.  Lying in bed.  Frail and debilitated.  Not in any discomfort. Respiratory system: Bilateral clear. Cardiovascular system: S1 & S2 heard, RRR. No JVD, murmurs, rubs, gallops or  clicks. No pedal edema. Gastrointestinal system: Soft.  Nontender. Central nervous system: Alert and oriented x2.  Mood is normal.  Affect is flat. Right lateral thigh incision looks clean and dry. Ecchymotic but no underlying collection.    Data Reviewed: I have personally reviewed following labs and imaging studies  CBC: Recent Labs  Lab 06/30/20 0449 06/30/20 1804 07/01/20 0913 07/02/20 0216 07/04/20 0319  WBC 6.8  --  9.2 9.2 9.2  NEUTROABS 4.4  --  7.6 6.3  --   HGB 7.7* 11.5* 13.0 10.6* 10.7*  HCT 23.3* 33.3* 37.2 30.2* 32.2*  MCV 90.7  --  88.4 88.0 90.7  PLT 256  --  418* 311 387   Basic Metabolic Panel: Recent Labs  Lab 06/30/20 0449 07/01/20 0913 07/02/20 0216 07/03/20 0136 07/04/20 0319 07/05/20 0211  NA 130* 127* 128* 128* 130* 130*  K 4.3 3.4* 4.6 4.1 4.3 4.4  CL 96* 90* 94* 94* 96* 96*  CO2 25 25 25 25 25 25   GLUCOSE 96 135* 94 94 100* 91  BUN 18 15 25* 22 23 29*  CREATININE 0.76 0.83 0.67 0.67 0.68 0.72  CALCIUM 8.2* 9.3 8.2* 8.5* 8.7* 8.7*  MG 2.0 2.2 2.0  --   --   --   PHOS 3.0 2.8 2.6  --   --   --    GFR: Estimated Creatinine Clearance: 41.6 mL/min (by C-G formula based on SCr of 0.72 mg/dL). Liver Function Tests: Recent Labs  Lab 06/30/20 0449 07/01/20 0913 07/02/20 0216  AST 24 29 22   ALT 15 17 14   ALKPHOS 39 57 52  BILITOT 0.6 1.1 0.8  PROT 4.8* 6.6 5.0*  ALBUMIN 2.3* 3.1* 2.4*   No results for input(s): LIPASE, AMYLASE in the last 168 hours. No results for input(s): AMMONIA in the last 168 hours. Coagulation Profile: No results for input(s): INR, PROTIME in the last 168 hours. Cardiac Enzymes: No results for input(s): CKTOTAL, CKMB, CKMBINDEX, TROPONINI in the last 168 hours. BNP (last 3 results) No results for input(s): PROBNP in the last 8760 hours. HbA1C: No results for input(s): HGBA1C in the last 72 hours. CBG: Recent Labs  Lab 07/04/20 0631 07/05/20 0045 07/05/20 0702 07/06/20 0006 07/06/20 0709  GLUCAP 104* 87  93 85 94   Lipid Profile: No results for input(s): CHOL, HDL, LDLCALC, TRIG, CHOLHDL, LDLDIRECT in the last 72 hours. Thyroid Function Tests: No results for input(s): TSH, T4TOTAL, FREET4, T3FREE, THYROIDAB in the last 72 hours. Anemia Panel: No results for input(s): VITAMINB12, FOLATE, FERRITIN, TIBC, IRON, RETICCTPCT in the last 72 hours. Sepsis Labs: No results for input(s): PROCALCITON, LATICACIDVEN in the last 168 hours.  Recent Results (from the past 240 hour(s))  Culture, Urine     Status: None   Collection Time: 06/27/20 11:29 AM   Specimen: Urine, Clean Catch  Result Value Ref Range Status   Specimen Description URINE, CLEAN CATCH  Final   Special Requests NONE  Final   Culture   Final    NO GROWTH Performed at [facility],  [address], [place], [place] [address]    Report Status 06/28/2020 FINAL  Final         Radiology Studies: No results found.      Scheduled Meds: . aspirin EC  81 mg Oral BID  . feeding supplement  237 mL Oral TID BM  . ferrous sulfate  325 mg Oral TID PC  . multivitamin with minerals  1 tablet Oral Daily  . polyethylene glycol  17 g Oral BID  . senna-docusate  1 tablet Oral BID   Continuous Infusions: . methocarbamol (ROBAXIN) IV       LOS: 11 days    Time spent: 25 minutes     Dorcas Carrow, MD [facility] Pager [phone]

## 2020-07-06 NOTE — Plan of Care (Signed)

## 2020-07-07 DIAGNOSIS — G301 Alzheimer's disease with late onset: Secondary | ICD-10-CM

## 2020-07-07 DIAGNOSIS — S72001A Fracture of unspecified part of neck of right femur, initial encounter for closed fracture: Secondary | ICD-10-CM | POA: Diagnosis not present

## 2020-07-07 DIAGNOSIS — F0281 Dementia in other diseases classified elsewhere with behavioral disturbance: Secondary | ICD-10-CM

## 2020-07-07 LAB — GLUCOSE, CAPILLARY: Glucose-Capillary: 121 mg/dL — ABNORMAL HIGH (ref 70–99)

## 2020-07-07 MED ORDER — SENNOSIDES-DOCUSATE SODIUM 8.6-50 MG PO TABS: 1.0000 | ORAL_TABLET | Freq: Two times a day (BID) | ORAL | Status: AC

## 2020-07-07 MED ORDER — TRAMADOL HCL 50 MG PO TABS: 50.0000 mg | ORAL_TABLET | Freq: Two times a day (BID) | ORAL | 0 refills | Status: AC | PRN

## 2020-07-07 MED ORDER — POLYETHYLENE GLYCOL 3350 17 G PO PACK: 17.0000 g | PACK | Freq: Every day | ORAL | 0 refills | Status: AC | PRN

## 2020-07-07 MED ORDER — ASPIRIN 81 MG PO TBEC: 81.0000 mg | DELAYED_RELEASE_TABLET | Freq: Two times a day (BID) | ORAL | 0 refills | Status: AC

## 2020-07-07 MED ORDER — FERROUS SULFATE 325 (65 FE) MG PO TABS: 325.0000 mg | ORAL_TABLET | Freq: Three times a day (TID) | ORAL | 3 refills | Status: AC

## 2020-07-07 NOTE — Progress Notes (Addendum)
Pt was given AVS and went over with her. Son was in the room while going over it with her. Belongings were gathered for her to take with her. [facility] was called to transport her to facility.    I tried calling [employer] to give report twice. No answer. I left a voicemail for them to call me back.

## 2020-07-07 NOTE — TOC Transition Note (Signed)
Transition of Care [facility]) - CM/SW Discharge Note   Patient Details  Name: Suzanne Hall MRN: [id number] Date of Birth: [birth year]  [facility]) CM/SW Contact:  Carley Hammed, LCSWA Phone Number: 07/07/2020, 12:45 PM   Clinical Narrative:    Nurse to call report to [phone]. Rm. [phone]   Final next level of care: Skilled Nursing Facility Barriers to Discharge: Barriers Resolved   Patient Goals and CMS Choice Patient states their goals for this hospitalization and ongoing recovery are:: Patient and son agreeable to ST rehab CMS Medicare.gov Compare Post Acute Care list provided to:: Patient Represenative (must comment) (Son informed regarding Medicare.gov for SNF information) Choice offered to / list presented to : Adult Children  Discharge Placement              Patient chooses bed at: [facility] Patient to be transferred to facility by: [facility] Name of family member notified: Chrissie Noa Patient and family notified of of transfer: 07/07/20  Discharge Plan and Services In-house Referral: Clinical Social Work                                   Social Determinants of Health (SDOH) Interventions     Readmission Risk Interventions No flowsheet data found.

## 2020-07-07 NOTE — Anesthesia Postprocedure Evaluation (Signed)
Anesthesia Post Note  Patient: Suzanne Hall  Procedure(s) Performed: ARTHROPLASTY BIPOLAR HIP (HEMIARTHROPLASTY) (Right Hip)     Patient location during evaluation: PACU Anesthesia Type: General Level of consciousness: awake and alert Pain management: pain level controlled Vital Signs Assessment: post-procedure vital signs reviewed and stable Respiratory status: spontaneous breathing, nonlabored ventilation, respiratory function stable and patient connected to nasal cannula oxygen Cardiovascular status: blood pressure returned to baseline and stable Postop Assessment: no apparent nausea or vomiting Anesthetic complications: no   No complications documented.  Last Vitals:  Vitals:   07/06/20 2052 07/07/20 0631  BP: (!) 139/56 (!) 126/100  Pulse: 71 77  Resp: 18 18  Temp: 37.2 C 37.2 C  SpO2: 98% 100%    Last Pain:  Vitals:   07/07/20 0631  TempSrc: Oral  PainSc:                  Prentiss Hammett S

## 2020-07-07 NOTE — Discharge Summary (Signed)
Physician Discharge Summary  DANEKA LANTIGUA ZYS:[id number] DOB: [birth year] DOA: 06/25/2020  PCP: Patient, No Pcp Per  Admit date: 06/25/2020 Discharge date: 07/07/2020  Admitted From: Home Disposition: Skilled nursing facility  Recommendations for Outpatient Follow-up:  1. Follow up with PCP in 1-2 weeks 2. Please obtain BMP/CBC in one week 3. Schedule follow-up with orthopedic surgery  Home Health: Not applicable Equipment/Devices: Not applicable  Discharge Condition: Stable CODE STATUS: Full code Diet recommendation: Regular diet, fluid restriction 1200 mL/day.  Discharge summary:  87 year old female with history of dementia brought from home with unwitnessed fall and she was found on the floor.  She complained of right hip pain.  In the emergency room, x-ray showed right hip fracture and CT head showed frontal scalp hematoma.  Sodium 128.  Patient underwent right hip Hemi arthroplasty on 12/30.  Patient developed postop acute blood loss anemia, sundowning, agitation and now all improved.    Assessment & Plan of care:   Closed right hip fracture, mechanical: Status post right Hemi hip 12/30 Weightbearing as tolerated Currently pain managed with Tylenol, not using any opiates Aspirin 81 mg twice daily for DVT prophylaxis Medically stable to transfer to skilled level of care  Hyponatremia: Stabilized.  TSH normal.  Cortisol level normal.  Sodium level is 130.  May have some compulsive water drinking.  Continue fluid restriction to 1200 mL/day.  Constipation/fecal impaction: Treated with laxatives with improvement.  Good bowel movements now.  Suspected UTI: Improved.  Cultures negative.  Treated with 5 days of IV ceftriaxone.  Postop acute blood loss anemia: Hemoglobin dropped to 6.6-3 units of PRBC transfusion with appropriate response and hemoglobin has stabilized. CT scan abdomen pelvis showed 9.7 cm deep subcutaneous hematoma laterally in the hip consistent with  long bone fracture. Continue iron supplements.  History of dementia with behavioral disturbances: Patient developed delirium transiently requiring restraints.  Currently mental status improved. Mental status improved and normalized. Avoiding opiates and benzos.  Tylenol for pain relief.  Severe protein calorie malnutrition: Nutrition Status: Nutrition Problem: Severe Malnutrition Etiology: chronic illness (dementia) Signs/Symptoms: severe fat depletion,moderate fat depletion,moderate muscle depletion,severe muscle depletion Interventions: Ensure Enlive (each supplement provides 350kcal and 20 grams of protein),Magic cup,MVI   Patient is medically stabilized to transfer to skilled level of care today.   Discharge Diagnoses:  Principal Problem:   Closed right hip fracture (HCC) Active Problems:   Hyponatremia   Dementia (HCC)   Hip fracture (HCC)   Protein-calorie malnutrition, severe    Discharge Instructions  Discharge Instructions    Call MD for:  redness, tenderness, or signs of infection (pain, swelling, redness, odor or green/yellow discharge around incision site)   Complete by: As directed    Call MD for:  severe uncontrolled pain   Complete by: As directed    Call MD for:  temperature >100.4   Complete by: As directed    Diet - low sodium heart healthy   Complete by: As directed    Increase activity slowly   Complete by: As directed    Leave dressing on - Keep it clean, dry, and intact until clinic visit   Complete by: As directed    No dressing needed   Complete by: As directed      Allergies as of 07/07/2020   No Known Allergies     Medication List    STOP taking these medications   LORAZEPAM IJ     TAKE these medications   acetaminophen 500 MG tablet Commonly known  as: TYLENOL Take 500 mg by mouth every 6 (six) hours as needed for moderate pain.   aspirin 81 MG EC tablet Take 1 tablet (81 mg total) by mouth 2 (two) times daily for 21 days.  Swallow whole.   ferrous sulfate 325 (65 FE) MG tablet Take 1 tablet (325 mg total) by mouth 3 (three) times daily after meals.   polyethylene glycol 17 g packet Commonly known as: MIRALAX / GLYCOLAX Take 17 g by mouth daily as needed.   senna-docusate 8.6-50 MG tablet Commonly known as: Senokot-S Take 1 tablet by mouth 2 (two) times daily.   traMADol 50 MG tablet Commonly known as: ULTRAM Take 1-2 tablets (50-100 mg total) by mouth every 12 (twelve) hours as needed for up to 5 days for moderate pain or severe pain.            Discharge Care Instructions  (From admission, onward)         Start     Ordered   07/07/20 0000  No dressing needed        07/07/20 1034   07/07/20 0000  Leave dressing on - Keep it clean, dry, and intact until clinic visit        07/07/20 1034          Contact information for follow-up providers    Durene Romans, MD. Schedule an appointment as soon as possible for a visit in 2 weeks.   Specialty: Orthopedic Surgery Contact information: [address] [place] [address] [place] [address] [phone]            Contact information for after-discharge care    Destination    [facility] .   Service: Skilled Nursing Contact information: [address] [place] [address] [phone]                 No Known Allergies  Consultations:  Orthopedics   Procedures/Studies: CT ABDOMEN PELVIS WO CONTRAST  Result Date: 06/30/2020 CLINICAL DATA:  Acute blood loss anemia EXAM: CT ABDOMEN AND PELVIS WITHOUT CONTRAST TECHNIQUE: Multidetector CT imaging of the abdomen and pelvis was performed following the standard protocol without IV contrast. COMPARISON:  None available FINDINGS: Lower chest: No pleural effusion.  Small volume pericardial fluid. Hepatobiliary: No focal liver abnormality is seen. No gallstones, gallbladder wall thickening, or biliary dilatation. Pancreas: Unremarkable. No pancreatic  ductal dilatation or surrounding inflammatory changes. Spleen: Normal in size without focal abnormality. Adrenals/Urinary Tract: Adrenal glands unremarkable. 1.2 cm low-attenuation mid left renal lesion possibly cyst but incompletely characterized. Prominent bilateral extrarenal collecting systems. The urinary bladder is distended. Stomach/Bowel: Stomach decompressed. Small bowel nondilated. Normal appendix. The colon is physiologically distended proximally. Multiple descending and sigmoid diverticula without significant adjacent inflammatory change. Fecal dilatation of the rectum up to 7.8 cm diameter. Vascular/Lymphatic: Coarse aortoiliac arterial calcifications without aneurysm. No abdominopelvic adenopathy. Reproductive: Uterus unremarkable. 4.6 cm hypodense appearing left cystic adnexal process. Other: No ascites.  No free air.  No retroperitoneal hematoma. Musculoskeletal: Lumbar dextroscoliosis with multilevel spondylitic change. T12 compression fracture deformity with approximately 40% loss of height anteriorly, age indeterminate. Left hip arthroplasty components project in expected location. The distal aspect of the femoral component is not visualized. 9.7 cm hematoma laterally in the deep subcutaneous tissues of the right hip. IMPRESSION: 1. 9.7 cm deep subcutaneous hematoma laterally in the right hip. 2. 4.6 cm left cystic adnexal process, not adequately characterized. Recommend pelvic ultrasound for further evaluation. 3. Descending and sigmoid diverticulosis. 4.  Fecal dilatation of the rectum up to 7.8 cm diameter. 5. T12 compression fracture deformity, age indeterminate. 6. 1.2 cm low-attenuation mid left renal lesion possibly cyst but incompletely characterized. Aortic Atherosclerosis (ICD10-I70.0). Electronically Signed   By: Corlis Leak  Hassell M.D.   On: 06/30/2020 16:02   DG Chest 1 View  Result Date: 06/25/2020 CLINICAL DATA:  Right hip fracture. EXAM: CHEST  1 VIEW COMPARISON:  None. FINDINGS: The  heart size and mediastinal contours are within normal limits. Both lungs are clear. The visualized skeletal structures are unremarkable. IMPRESSION: No active disease. Electronically Signed   By: Lupita RaiderJames  Green Jr M.D.   On: 06/25/2020 20:15   CT Head Wo Contrast  Result Date: 06/25/2020 CLINICAL DATA:  Un witnessed fall, head trauma EXAM: CT HEAD WITHOUT CONTRAST TECHNIQUE: Contiguous axial images were obtained from the base of the skull through the vertex without intravenous contrast. COMPARISON:  None. FINDINGS: Brain: Encephalomalacia within the anterior aspect left temporal lobe may reflect previous infarct or trauma. No acute infarct or hemorrhage. Lateral ventricles and midline structures are unremarkable. No acute extra-axial fluid collections. No mass effect. Vascular: No hyperdense vessel or unexpected calcification. Skull: Minimal right frontal scalp edema. Negative for fracture or focal lesion. Sinuses/Orbits: No acute finding. Other: None. IMPRESSION: 1. Small right frontal scalp hematoma. 2. No acute intracranial process. Electronically Signed   By: Sharlet SalinaMichael  Brown M.D.   On: 06/25/2020 21:08   CT Cervical Spine Wo Contrast  Result Date: 06/25/2020 CLINICAL DATA:  Un witnessed fall EXAM: CT CERVICAL SPINE WITHOUT CONTRAST TECHNIQUE: Multidetector CT imaging of the cervical spine was performed without intravenous contrast. Multiplanar CT image reconstructions were also generated. COMPARISON:  None. FINDINGS: Alignment: There is mild retrolisthesis of C5 on C6. Otherwise alignment is anatomic. Skull base and vertebrae: No acute fracture. No primary bone lesion or focal pathologic process. Soft tissues and spinal canal: No prevertebral fluid or swelling. No visible canal hematoma. Disc levels: There is mild spondylosis at C3-4, resulting in mild left neural foraminal encroachment. There is moderate spondylosis at C5-6, resulting in symmetrical neural foraminal encroachment. Mild spondylosis at C6-7  results in left-sided neural foraminal encroachment. There is diffuse facet hypertrophy throughout the cervical spine. Upper chest: Airway is patent.  Lung apices are clear. Other: Reconstructed images demonstrate no additional findings. IMPRESSION: 1. Multilevel cervical spondylosis and facet hypertrophy. 2. No acute cervical spine fracture. Electronically Signed   By: Sharlet SalinaMichael  Brown M.D.   On: 06/25/2020 21:11   US PELVIS (TRANSABDOMINAL ONLY)  Result Date: 07/01/2020 CLINICAL DATA:  Adnexal lesion on CT EXAM: TRANSABDOMINAL ULTRASOUND OF PELVIS TECHNIQUE: Transabdominal ultrasound examination of the pelvis was performed including evaluation of the uterus, ovaries, adnexal regions, and pelvic cul-de-sac. COMPARISON:  CT 06/30/2020 FINDINGS: Uterus Measurements: 4.6 x 3 x 3.6 cm = volume: 45.7 mL. No fibroids or other mass visualized. Endometrium Thickness: 5.2 mm.  No focal abnormality visualized. Right ovary Measurements: 2.4 x 2 x 2.1 cm = volume: 5.3 mL. 2 x 1.7 x 1.9 cm anechoic cyst. Left ovary Measurements: 4.8 x 4.4 x 5 cm = volume: 54.9 mL. 5.1 by 4.7 x 4.5 cm anechoic cyst in the left adnexa. Other findings:  No abnormal free fluid. IMPRESSION: 1. Bilateral ovarian cysts, measuring up to 5.1 cm in the left adnexa. Consider GYN consult and followup US in 3-6 months, or pelvis MRI w/o and w/ contrast for improved characterization. Note: This recommendation does not apply to premenarchal patients or to those with increased risk (genetic,  family history, elevated tumor markers or other high-risk factors) of ovarian cancer. Reference: Radiology 2019 Nov; 293(2):359-371. Electronically Signed   By: Jasmine Pang M.D.   On: 07/01/2020 19:48   DG Pelvis Portable  Result Date: 06/26/2020 CLINICAL DATA:  Status post RIGHT hip replacement EXAM: PORTABLE PELVIS 1-2 VIEWS COMPARISON:  Hip evaluation from June 25, 2020 FINDINGS: AP view of the pelvis with interval placement of a RIGHT hip hemiarthroplasty  construct without complicating features. Femoral component appears well seated on AP view. Acetabular component within the acetabulum without signs of fracture on AP view. Osteopenia. Mild degenerative changes about the LEFT hip. Soft tissues with expected postoperative changes about the RIGHT hip. IMPRESSION: RIGHT hip hemiarthroplasty placement without complicating features. Electronically Signed   By: Donzetta Kohut M.D.   On: 06/26/2020 10:41   DG Hip Unilat W or Wo Pelvis 2-3 Views Right  Result Date: 06/25/2020 CLINICAL DATA:  Right hip pain after fall. EXAM: DG HIP (WITH OR WITHOUT PELVIS) 2-3V RIGHT COMPARISON:  None. FINDINGS: Severely displaced proximal right femoral neck fracture is noted. Left hip is unremarkable. IMPRESSION: Severely displaced proximal right femoral neck fracture. Electronically Signed   By: Lupita Raider M.D.   On: 06/25/2020 20:15    (Echo, Carotid, EGD, Colonoscopy, ERCP)    Subjective: Patient seen and examined.  No overnight events.  She herself denies any complaints.  Nursing reported no issues.  No agitations or trouble sleeping.   Discharge Exam: Vitals:   07/07/20 0631 07/07/20 0753  BP: (!) 126/100 (!) 120/43  Pulse: 77 74  Resp: 18 15  Temp: 98.9 F (37.2 C) 98.3 F (36.8 C)  SpO2: 100% 96%   Vitals:   07/06/20 1458 07/06/20 2052 07/07/20 0631 07/07/20 0753  BP: (!) 137/57 (!) 139/56 (!) 126/100 (!) 120/43  Pulse: 77 71 77 74  Resp: 16 18 18 15   Temp: (!) 97.5 F (36.4 C) 98.9 F (37.2 C) 98.9 F (37.2 C) 98.3 F (36.8 C)  TempSrc: Oral Oral Oral Oral  SpO2: 98% 98% 100% 96%  Weight:      Height:        General: Pt is alert, awake, not in acute distress Pleasant.  Oriented x2.  Mood is normal.  Affect is flat. Cardiovascular: RRR, S1/S2 +, no rubs, no gallops Respiratory: CTA bilaterally, no wheezing, no rhonchi Abdominal: Soft, NT, ND, bowel sounds + Extremities: no edema, no cyanosis Right lateral thigh incision looks clean  and dry.  Has some ecchymosis but no evidence of underlying swelling or edema.    The results of significant diagnostics from this hospitalization (including imaging, microbiology, ancillary and laboratory) are listed below for reference.     Microbiology: Recent Results (from the past 240 hour(s))  Culture, Urine     Status: None   Collection Time: 06/27/20 11:29 AM   Specimen: Urine, Clean Catch  Result Value Ref Range Status   Specimen Description URINE, CLEAN CATCH  Final   Special Requests NONE  Final   Culture   Final    NO GROWTH Performed at [facility], [address] [place], [place]    Report Status 06/28/2020 FINAL  Final  SARS CORONAVIRUS 2 (TAT 6-24 HRS) Nasopharyngeal Nasopharyngeal Swab     Status: None   Collection Time: 07/06/20  4:25 PM   Specimen: Nasopharyngeal Swab  Result Value Ref Range Status   SARS Coronavirus 2 NEGATIVE NEGATIVE Final    Comment: (NOTE) SARS-CoV-2 target  nucleic acids are NOT DETECTED.  The SARS-CoV-2 RNA is generally detectable in upper and lower respiratory specimens during the acute phase of infection. Negative results do not preclude SARS-CoV-2 infection, do not rule out co-infections with other pathogens, and should not be used as the sole basis for treatment or other patient management decisions. Negative results must be combined with clinical observations, patient history, and epidemiological information. The expected result is Negative.  Fact Sheet for Patients: HairSlick.nohttps://www.fda.gov/media/138098/download  Fact Sheet for Healthcare Providers: quierodirigir.comhttps://www.fda.gov/media/138095/download  This test is not yet approved or cleared by the [place] FDA and  has been authorized for detection and/or diagnosis of SARS-CoV-2 by FDA under an Emergency Use Authorization (EUA). This EUA will remain  in effect (meaning this test can be used) for the duration of the COVID-19 declaration under Se ction 564(b)(1) of  the Act, 21 U.S.C. section 360bbb-3(b)(1), unless the authorization is terminated or revoked sooner.  Performed at [facility], [address] [place], [place] [id number]      Labs: BNP (last 3 results) No results for input(s): BNP in the last 8760 hours. Basic Metabolic Panel: Recent Labs  Lab 07/01/20 0913 07/02/20 0216 07/03/20 0136 07/04/20 0319 07/05/20 0211  NA 127* 128* 128* 130* 130*  K 3.4* 4.6 4.1 4.3 4.4  CL 90* 94* 94* 96* 96*  CO2 25 25 25 25 25   GLUCOSE 135* 94 94 100* 91  BUN 15 25* 22 23 29*  CREATININE 0.83 0.67 0.67 0.68 0.72  CALCIUM 9.3 8.2* 8.5* 8.7* 8.7*  MG 2.2 2.0  --   --   --   PHOS 2.8 2.6  --   --   --    Liver Function Tests: Recent Labs  Lab 07/01/20 0913 07/02/20 0216  AST 29 22  ALT 17 14  ALKPHOS 57 52  BILITOT 1.1 0.8  PROT 6.6 5.0*  ALBUMIN 3.1* 2.4*   No results for input(s): LIPASE, AMYLASE in the last 168 hours. No results for input(s): AMMONIA in the last 168 hours. CBC: Recent Labs  Lab 06/30/20 1804 07/01/20 0913 07/02/20 0216 07/04/20 0319  WBC  --  9.2 9.2 9.2  NEUTROABS  --  7.6 6.3  --   HGB 11.5* 13.0 10.6* 10.7*  HCT 33.3* 37.2 30.2* 32.2*  MCV  --  88.4 88.0 90.7  PLT  --  418* 311 387   Cardiac Enzymes: No results for input(s): CKTOTAL, CKMB, CKMBINDEX, TROPONINI in the last 168 hours. BNP: Invalid input(s): POCBNP CBG: Recent Labs  Lab 07/04/20 0631 07/05/20 0045 07/05/20 0702 07/06/20 0006 07/06/20 0709  GLUCAP 104* 87 93 85 94   D-Dimer No results for input(s): DDIMER in the last 72 hours. Hgb A1c No results for input(s): HGBA1C in the last 72 hours. Lipid Profile No results for input(s): CHOL, HDL, LDLCALC, TRIG, CHOLHDL, LDLDIRECT in the last 72 hours. Thyroid function studies No results for input(s): TSH, T4TOTAL, T3FREE, THYROIDAB in the last 72 hours.  Invalid input(s): FREET3 Anemia work up No results for input(s): VITAMINB12, FOLATE, FERRITIN, TIBC, IRON, RETICCTPCT in  the last 72 hours. Urinalysis    Component Value Date/Time   COLORURINE YELLOW 06/26/2020 1502   APPEARANCEUR HAZY (A) 06/26/2020 1502   LABSPEC 1.016 06/26/2020 1502   PHURINE 6.0 06/26/2020 1502   GLUCOSEU NEGATIVE 06/26/2020 1502   HGBUR MODERATE (A) 06/26/2020 1502   BILIRUBINUR NEGATIVE 06/26/2020 1502   KETONESUR NEGATIVE 06/26/2020 1502   PROTEINUR 100 (A) 06/26/2020 1502  UROBILINOGEN 0.2 07/17/2010 2145   NITRITE POSITIVE (A) 06/26/2020 1502   LEUKOCYTESUR LARGE (A) 06/26/2020 1502   Sepsis Labs Invalid input(s): PROCALCITONIN,  WBC,  LACTICIDVEN Microbiology Recent Results (from the past 240 hour(s))  Culture, Urine     Status: None   Collection Time: 06/27/20 11:29 AM   Specimen: Urine, Clean Catch  Result Value Ref Range Status   Specimen Description URINE, CLEAN CATCH  Final   Special Requests NONE  Final   Culture   Final    NO GROWTH Performed at [facility], [address], [place], [place] [address]    Report Status 06/28/2020 FINAL  Final  SARS CORONAVIRUS 2 (TAT 6-24 HRS) Nasopharyngeal Nasopharyngeal Swab     Status: None   Collection Time: 07/06/20  4:25 PM   Specimen: Nasopharyngeal Swab  Result Value Ref Range Status   SARS Coronavirus 2 NEGATIVE NEGATIVE Final    Comment: (NOTE) SARS-CoV-2 target nucleic acids are NOT DETECTED.  The SARS-CoV-2 RNA is generally detectable in upper and lower respiratory specimens during the acute phase of infection. Negative results do not preclude SARS-CoV-2 infection, do not rule out co-infections with other pathogens, and should not be used as the sole basis for treatment or other patient management decisions. Negative results must be combined with clinical observations, patient history, and epidemiological information. The expected result is Negative.  Fact Sheet for Patients: HairSlick.no  Fact Sheet for Healthcare  Providers: quierodirigir.com  This test is not yet approved or cleared by the [place] FDA and  has been authorized for detection and/or diagnosis of SARS-CoV-2 by FDA under an Emergency Use Authorization (EUA). This EUA will remain  in effect (meaning this test can be used) for the duration of the COVID-19 declaration under Se ction 564(b)(1) of the Act, 21 U.S.C. section 360bbb-3(b)(1), unless the authorization is terminated or revoked sooner.  Performed at [facility], [address] [place], [place] [address]      Time coordinating discharge: 35 minutes  SIGNED:   Dorcas Carrow, MD  [facility] 07/07/2020, 10:34 AM

## 2020-11-16 ENCOUNTER — Inpatient Hospital Stay (HOSPITAL_COMMUNITY)
Admission: EM | Admit: 2020-11-16 | Discharge: 2020-11-21 | DRG: 535 | Disposition: A | Discharge status: Skilled Nursing Facility | Payer: Medicare Other | Source: Skilled Nursing Facility | Attending: Family Medicine | Admitting: Family Medicine

## 2020-11-16 ENCOUNTER — Other Ambulatory Visit: Payer: Self-pay

## 2020-11-16 ENCOUNTER — Emergency Department (HOSPITAL_COMMUNITY): Payer: Medicare Other

## 2020-11-16 ENCOUNTER — Encounter (HOSPITAL_COMMUNITY): Payer: Self-pay | Admitting: Internal Medicine

## 2020-11-16 DIAGNOSIS — E871 Hypo-osmolality and hyponatremia: Secondary | ICD-10-CM | POA: Diagnosis present

## 2020-11-16 DIAGNOSIS — S72144A Nondisplaced intertrochanteric fracture of right femur, initial encounter for closed fracture: Secondary | ICD-10-CM

## 2020-11-16 DIAGNOSIS — F0391 Unspecified dementia with behavioral disturbance: Secondary | ICD-10-CM | POA: Diagnosis present

## 2020-11-16 DIAGNOSIS — S72111A Displaced fracture of greater trochanter of right femur, initial encounter for closed fracture: Secondary | ICD-10-CM | POA: Diagnosis present

## 2020-11-16 DIAGNOSIS — S72101A Unspecified trochanteric fracture of right femur, initial encounter for closed fracture: Secondary | ICD-10-CM | POA: Diagnosis not present

## 2020-11-16 DIAGNOSIS — Z79899 Other long term (current) drug therapy: Secondary | ICD-10-CM

## 2020-11-16 DIAGNOSIS — W1830XA Fall on same level, unspecified, initial encounter: Secondary | ICD-10-CM | POA: Diagnosis present

## 2020-11-16 DIAGNOSIS — R631 Polydipsia: Secondary | ICD-10-CM | POA: Diagnosis present

## 2020-11-16 DIAGNOSIS — Z66 Do not resuscitate: Secondary | ICD-10-CM | POA: Diagnosis present

## 2020-11-16 DIAGNOSIS — Z7189 Other specified counseling: Secondary | ICD-10-CM | POA: Diagnosis not present

## 2020-11-16 DIAGNOSIS — Z20822 Contact with and (suspected) exposure to covid-19: Secondary | ICD-10-CM | POA: Diagnosis present

## 2020-11-16 DIAGNOSIS — E559 Vitamin D deficiency, unspecified: Secondary | ICD-10-CM | POA: Diagnosis present

## 2020-11-16 DIAGNOSIS — Z681 Body mass index (BMI) 19 or less, adult: Secondary | ICD-10-CM | POA: Diagnosis not present

## 2020-11-16 DIAGNOSIS — F0281 Dementia in other diseases classified elsewhere with behavioral disturbance: Secondary | ICD-10-CM | POA: Diagnosis not present

## 2020-11-16 DIAGNOSIS — S73006A Unspecified dislocation of unspecified hip, initial encounter: Secondary | ICD-10-CM

## 2020-11-16 DIAGNOSIS — Z8249 Family history of ischemic heart disease and other diseases of the circulatory system: Secondary | ICD-10-CM | POA: Diagnosis not present

## 2020-11-16 DIAGNOSIS — S73004A Unspecified dislocation of right hip, initial encounter: Secondary | ICD-10-CM

## 2020-11-16 DIAGNOSIS — E43 Unspecified severe protein-calorie malnutrition: Secondary | ICD-10-CM | POA: Diagnosis present

## 2020-11-16 DIAGNOSIS — T84020A Dislocation of internal right hip prosthesis, initial encounter: Secondary | ICD-10-CM | POA: Diagnosis not present

## 2020-11-16 DIAGNOSIS — Z23 Encounter for immunization: Secondary | ICD-10-CM

## 2020-11-16 DIAGNOSIS — Y792 Prosthetic and other implants, materials and accessory orthopedic devices associated with adverse incidents: Secondary | ICD-10-CM | POA: Diagnosis not present

## 2020-11-16 DIAGNOSIS — G309 Alzheimer's disease, unspecified: Secondary | ICD-10-CM

## 2020-11-16 DIAGNOSIS — S72141A Displaced intertrochanteric fracture of right femur, initial encounter for closed fracture: Principal | ICD-10-CM | POA: Diagnosis present

## 2020-11-16 DIAGNOSIS — E785 Hyperlipidemia, unspecified: Secondary | ICD-10-CM | POA: Diagnosis present

## 2020-11-16 DIAGNOSIS — M9701XA Periprosthetic fracture around internal prosthetic right hip joint, initial encounter: Secondary | ICD-10-CM | POA: Diagnosis present

## 2020-11-16 DIAGNOSIS — Y92129 Unspecified place in nursing home as the place of occurrence of the external cause: Secondary | ICD-10-CM

## 2020-11-16 DIAGNOSIS — S72001A Fracture of unspecified part of neck of right femur, initial encounter for closed fracture: Secondary | ICD-10-CM

## 2020-11-16 DIAGNOSIS — F03918 Unspecified dementia, unspecified severity, with other behavioral disturbance: Secondary | ICD-10-CM

## 2020-11-16 LAB — COMPREHENSIVE METABOLIC PANEL
ALT: 14 U/L (ref 0–44)
AST: 20 U/L (ref 15–41)
Albumin: 3.7 g/dL (ref 3.5–5.0)
Alkaline Phosphatase: 72 U/L (ref 38–126)
Anion gap: 8 (ref 5–15)
BUN: 18 mg/dL (ref 8–23)
CO2: 24 mmol/L (ref 22–32)
Calcium: 8.9 mg/dL (ref 8.9–10.3)
Chloride: 98 mmol/L (ref 98–111)
Creatinine, Ser: 0.68 mg/dL (ref 0.44–1.00)
GFR, Estimated: >60 mL/min (ref >60)
Glucose, Bld: 108 mg/dL — ABNORMAL HIGH (ref 70–99)
Potassium: 3.9 mmol/L (ref 3.5–5.1)
Sodium: 130 mmol/L — ABNORMAL LOW (ref 135–145)
Total Bilirubin: 0.4 mg/dL (ref 0.3–1.2)
Total Protein: 6.7 g/dL (ref 6.5–8.1)

## 2020-11-16 LAB — CBC WITH DIFFERENTIAL/PLATELET
Abs Immature Granulocytes: 0.05 10*3/uL (ref 0.00–0.07)
Basophils Absolute: 0 10*3/uL (ref 0.0–0.1)
Basophils Relative: 0 %
Eosinophils Absolute: 0 10*3/uL (ref 0.0–0.5)
Eosinophils Relative: 0 %
HCT: 38.4 % (ref 36.0–46.0)
Hemoglobin: 12.4 g/dL (ref 12.0–15.0)
Immature Granulocytes: 0 %
Lymphocytes Relative: 6 %
Lymphs Abs: 0.7 10*3/uL (ref 0.7–4.0)
MCH: 30.2 pg (ref 26.0–34.0)
MCHC: 32.3 g/dL (ref 30.0–36.0)
MCV: 93.7 fL (ref 80.0–100.0)
Monocytes Absolute: 1.2 10*3/uL — ABNORMAL HIGH (ref 0.1–1.0)
Monocytes Relative: 10 %
Neutro Abs: 9.9 10*3/uL — ABNORMAL HIGH (ref 1.7–7.7)
Neutrophils Relative %: 84 %
Platelets: 242 10*3/uL (ref 150–400)
RBC: 4.1 MIL/uL (ref 3.87–5.11)
RDW: 13.4 % (ref 11.5–15.5)
WBC: 11.9 10*3/uL — ABNORMAL HIGH (ref 4.0–10.5)
nRBC: 0 % (ref 0.0–0.2)

## 2020-11-16 LAB — RESP PANEL BY RT-PCR (FLU A&B, COVID) ARPGX2
Influenza A by PCR: NEGATIVE
Influenza B by PCR: NEGATIVE
SARS Coronavirus 2 by RT PCR: NEGATIVE

## 2020-11-16 LAB — SODIUM, URINE, RANDOM: Sodium, Ur: 132 mmol/L

## 2020-11-16 MED ORDER — MORPHINE SULFATE (PF) 2 MG/ML IV SOLN: 1.0000 mg | INTRAVENOUS | Status: DC | PRN

## 2020-11-16 MED ORDER — SENNOSIDES-DOCUSATE SODIUM 8.6-50 MG PO TABS
1.0000 | ORAL_TABLET | Freq: Two times a day (BID) | ORAL | Status: DC
  Administered 2020-11-17 – 2020-11-21 (×6): 1 via ORAL
  Filled 2020-11-16 (×8): qty 1

## 2020-11-16 MED ORDER — ACETAMINOPHEN 325 MG PO TABS: 650.0000 mg | ORAL_TABLET | ORAL | Status: DC | PRN

## 2020-11-16 MED ORDER — ACETAMINOPHEN 325 MG PO TABS
650.0000 mg | ORAL_TABLET | Freq: Two times a day (BID) | ORAL | Status: DC
  Administered 2020-11-17: 650 mg via ORAL
  Filled 2020-11-16: qty 2

## 2020-11-16 MED ORDER — MORPHINE SULFATE (PF) 4 MG/ML IV SOLN
4.0000 mg | Freq: Once | INTRAVENOUS | Status: AC
  Administered 2020-11-16: 4 mg via INTRAVENOUS
  Filled 2020-11-16: qty 1

## 2020-11-16 MED ORDER — LIDOCAINE 5 % EX PTCH
1.0000 | MEDICATED_PATCH | CUTANEOUS | Status: DC
  Administered 2020-11-17 – 2020-11-20 (×4): 1 via TRANSDERMAL
  Filled 2020-11-16 (×6): qty 1

## 2020-11-16 MED ORDER — POLYETHYLENE GLYCOL 3350 17 G PO PACK: 17.0000 g | PACK | Freq: Every day | ORAL | Status: DC | PRN

## 2020-11-16 MED ORDER — MORPHINE SULFATE (PF) 2 MG/ML IV SOLN: 2.0000 mg | INTRAVENOUS | Status: DC | PRN

## 2020-11-16 MED ORDER — ONDANSETRON HCL 4 MG/2ML IJ SOLN: 4.0000 mg | Freq: Four times a day (QID) | INTRAMUSCULAR | Status: DC | PRN

## 2020-11-16 NOTE — ED Triage Notes (Signed)
[facility] transferred pt from [place] at [facility].  Today, at 3pm staff witnessed pt fall slowly to the floor "like a ballerina." Pt did not hit head. No blood thinners. Pt complaining of right hip and right thigh pain. Staff report pt had right hip replacement with last year. No deformity or shortening.

## 2020-11-16 NOTE — ED Notes (Signed)
Attempted to ambulate pt, pt was only able to stand but stated that there was pain in her right hip and did not want to ambulate.

## 2020-11-16 NOTE — ED Provider Notes (Signed)
[facility]-EMERGENCY DEPT Provider Note   CSN: [id number] Arrival date & time: 11/16/20  1546     History Chief Complaint  Patient presents with  . Hip Pain    Suzanne Hall is a 87 y.o. female history of CKD, dementia, previous right hip replacement here presenting with fall.  Patient is from [facility] and the tripped and fell onto the right hip.  She has severe right hip pain afterwards.  Patient has no head injury or loss of consciousness.  Denies any other injuries.   The history is provided by the patient.       Past Medical History:  Diagnosis Date  . CKD (chronic kidney disease)   . Dementia (HCC)   . Hyperlipidemia   . Osteopenia   . Renal disorder     Patient Active Problem List   Diagnosis Date Noted  . Protein-calorie malnutrition, severe 07/03/2020  . Closed right hip fracture (HCC) 06/25/2020  . Hyponatremia 06/25/2020  . Dementia (HCC) 06/25/2020  . Hip fracture (HCC) 06/25/2020    Past Surgical History:  Procedure Laterality Date  . HIP ARTHROPLASTY Right 06/26/2020   Procedure: ARTHROPLASTY BIPOLAR HIP (HEMIARTHROPLASTY);  Surgeon: Durene Romans, MD;  Location: [facility] OR;  Service: Orthopedics;  Laterality: Right;     OB History   No obstetric history on file.     Family History  Problem Relation Age of Onset  . Heart disease Father     Social History   Tobacco Use  . Smoking status: Never Smoker  . Smokeless tobacco: Never Used  Substance Use Topics  . Alcohol use: Never  . Drug use: Never    Home Medications Prior to Admission medications   Medication Sig Start Date End Date Taking? Authorizing Provider  acetaminophen (TYLENOL) 500 MG tablet Take 500 mg by mouth every 6 (six) hours as needed for moderate pain.    [provider]  ferrous sulfate 325 (65 FE) MG tablet Take 1 tablet (325 mg total) by mouth 3 (three) times daily after meals. 07/07/20   Suzanne Carrow, MD  polyethylene  glycol (MIRALAX / GLYCOLAX) 17 g packet Take 17 g by mouth daily as needed. 07/07/20   Suzanne Carrow, MD  senna-docusate (SENOKOT-S) 8.6-50 MG tablet Take 1 tablet by mouth 2 (two) times daily. 07/07/20   Suzanne Carrow, MD    Allergies    Patient has no known allergies.  Review of Systems   Review of Systems  Musculoskeletal:       R hip pain   All other systems reviewed and are negative.   Physical Exam Updated Vital Signs BP (!) 111/92 (BP Location: Left Arm)   Pulse 72   Resp 18   SpO2 100%   Physical Exam Vitals and nursing note reviewed.  Constitutional:      Appearance: Normal appearance.  HENT:     Head: Normocephalic.     Comments: No obvious scalp hematoma.    Nose: Nose normal.     Mouth/Throat:     Mouth: Mucous membranes are moist.  Eyes:     Extraocular Movements: Extraocular movements intact.     Pupils: Pupils are equal, round, and reactive to light.  Cardiovascular:     Rate and Rhythm: Normal rate and regular rhythm.     Pulses: Normal pulses.     Heart sounds: Normal heart sounds.  Pulmonary:     Effort: Pulmonary effort is normal.     Breath sounds:  Normal breath sounds.  Abdominal:     General: Abdomen is flat.     Palpations: Abdomen is soft.  Musculoskeletal:     Cervical back: Normal range of motion and neck supple.     Comments: + Tenderness of the right hip.  Decreased range of motion.  However there is no obvious deformity.   Skin:    General: Skin is warm.     Capillary Refill: Capillary refill takes less than 2 seconds.  Neurological:     General: No focal deficit present.     Mental Status: She is alert and oriented to person, place, and time.  Psychiatric:        Mood and Affect: Mood normal.        Behavior: Behavior normal.     ED Results / Procedures / Treatments   Labs (all labs ordered are listed, but only abnormal results are displayed) Labs Reviewed  CBC WITH DIFFERENTIAL/PLATELET  COMPREHENSIVE METABOLIC PANEL     EKG None  Radiology DG Chest 1 View  Result Date: 11/16/2020 CLINICAL DATA:  Fall. EXAM: CHEST  1 VIEW COMPARISON:  06/25/2020 FINDINGS: Borderline cardiomegaly, similar to prior. Unchanged mediastinal contours. No acute airspace disease, pulmonary edema, pleural effusion, or pneumothorax. No acute osseous abnormalities are seen. Scoliotic curvature of the spine. IMPRESSION: No acute findings. Borderline cardiomegaly. Electronically Signed   By: Narda Rutherford M.D.   On: 11/16/2020 16:58   DG Hip Unilat W or Wo Pelvis 2-3 Views Right  Result Date: 11/16/2020 CLINICAL DATA:  Fall today.  Right hip pain. EXAM: DG HIP (WITH OR WITHOUT PELVIS) 2-3V RIGHT COMPARISON:  Hip radiograph 06/25/2020 FINDINGS: Right hip arthroplasty in expected alignment. There is a mildly displaced fracture involving the right greater trochanter extending to the intertrochanteric portion of femoral component. No additional fracture. Pubic rami are intact. Bones diffusely under mineralized. Pubic symphysis and sacroiliac joints are congruent. IMPRESSION: Right hip arthroplasty. There is a mildly displaced right greater trochanteric fracture extending to the intertrochanteric portion of femoral component. Electronically Signed   By: Narda Rutherford M.D.   On: 11/16/2020 16:57    Procedures Procedures   Medications Ordered in ED Medications  morphine 4 MG/ML injection 4 mg (has no administration in time range)    ED Course  I have reviewed the triage vital signs and the nursing notes.  Pertinent labs & imaging results that were available during my care of the patient were reviewed by me and considered in my medical decision making (see chart for details).    MDM Rules/Calculators/A&P                         Suzanne Hall is a 87 y.o. female who presented with right hip pain.  Patient had a fall with right hip pain.  Patient already had right hip replacement.  Plan to get x-ray of the hip to rule out  periprosthetic fracture or pelvic fracture.  Will give pain medicine as well  8:05 PM X-ray showed displaced right greater trochanter fracture.  The rod itself is not fractured.  I talked to Dr. Victorino Dike from Ortho.  He states that if patient is able to ambulate then she can be discharged back to the assisted living.  Patient was given morphine and was not able to even to bear weight on it.  At this point, she will be admitted.  Dr. Charlann Boxer from ortho will see her tomorrow as a consult  Final Clinical Impression(s) / ED Diagnoses Final diagnoses:  None    Rx / DC Orders ED Discharge Orders    None       Charlynne Pander, MD 11/16/20 2006

## 2020-11-16 NOTE — H&P (Signed)
History and Physical    Suzanne MostJoanne S Hall EXB:[id number] DOB: [birth year] DOA: 11/16/2020  PCP: Patient, No Pcp Per (Inactive)  Patient coming from: [facility]   Chief Complaint:  Chief Complaint  Patient presents with  . Hip Pain     HPI:    87 year old female with past medical history of advanced dementia, chronic hyponatremia as well as right hip arthroplasty status post fall 05/2020 who [facility] unit status post a witnessed fall by staff.  Patient is unable to provide any history due to advanced dementia.  Majority the history has been obtained from the emergency department staff and discussion with the son via phone conversation.  Earlier in the day on 5/22 at approximately 3 PM patient was witnessed to fall to the floor.  This was witnessed by care staff at the patient's facility.  They report no evidence of loss of consciousness or head trauma upon falling.  Immediately after falling patient complained of right hip and thigh pain.  Due to ongoing severe pain with inability to ambulate the patient was promptly brought to [facility] emergency department via EMS for evaluation.  Upon evaluation in the emergency department with a displaced right greater trochanteric fracture.  Case was discussed with Dr. Victorino DikeHewitt with orthopedic surgery covering for Dr. Charlann Boxerlin.  He recommended initial management with pain control and attempted ambulation.  If patient was able to ambulate successfully then patient could be discharged with outpatient follow-up.  Unfortunately patient's pain has been quite severe requiring multiple doses of opiate-based analgesics and patient is still unable to ambulate.  Therefore Dr. Victorino DikeHewitt stated that he will see the patient tomorrow in consultation.  The hospitalist group has now been called to assess the patient for admission to the hospital.  Review of Systems:    Review of Systems  Unable to perform ROS: Mental acuity    Past Medical History:  Diagnosis Date  . CKD (chronic kidney disease)   . Dementia (HCC)   . Hyperlipidemia   . Osteopenia   . Renal disorder     Past Surgical History:  Procedure Laterality Date  . HIP ARTHROPLASTY Right 06/26/2020   Procedure: ARTHROPLASTY BIPOLAR HIP (HEMIARTHROPLASTY);  Surgeon: Durene Romanslin, Matthew, MD;  Location[facility] OR;  Service: Orthopedics;  Laterality: Right;     reports that she has never smoked. She has never used smokeless tobacco. She reports that she does not drink alcohol and does not use drugs.  No Known Allergies  Family History  Problem Relation Age of Onset  . Heart disease Father      Prior to Admission medications   Medication Sig Start Date End Date Taking? Authorizing Provider  acetaminophen (TYLENOL) 500 MG tablet Take 500 mg by mouth every 6 (six) hours as needed for moderate pain.    [provider]  ferrous sulfate 325 (65 FE) MG tablet Take 1 tablet (325 mg total) by mouth 3 (three) times daily after meals. 07/07/20   Dorcas CarrowGhimire, Kuber, MD  polyethylene glycol (MIRALAX / GLYCOLAX) 17 g packet Take 17 g by mouth daily as needed. 07/07/20   Dorcas CarrowGhimire, Kuber, MD  senna-docusate (SENOKOT-S) 8.6-50 MG tablet Take 1 tablet by mouth 2 (two) times daily. 07/07/20   Dorcas CarrowGhimire, Kuber, MD    Physical Exam: Vitals:   11/16/20 1731 11/16/20 1905 11/16/20 2151 11/16/20 2210  BP: (!) 111/92 (!) 162/93 (!) 179/68 (!) 163/80  Pulse: 72 88  75 78  Resp: 18 18  18   Temp:    98.2 F (36.8 C)  TempSrc:    Oral  SpO2: 100% 99% 95% 98%    Constitutional: Awake alert and oriented x2, mild distress due to right hip pain.   Skin: no rashes, no lesions, somewhat poor skin turgor noted. Eyes: Pupils are equally reactive to light.  No evidence of scleral icterus or conjunctival pallor.  ENMT: Moist mucous membranes noted.  Posterior pharynx clear of any exudate or lesions.   Neck: normal,  supple, no masses, no thyromegaly.  No evidence of jugular venous distension.   Respiratory: clear to auscultation bilaterally, no wheezing, no crackles. Normal respiratory effort. No accessory muscle use.  Cardiovascular: Regular rate and rhythm, no murmurs / rubs / gallops. No extremity edema. 2+ pedal pulses. No carotid bruits.  Chest:   Nontender without crepitus or deformity.   Back:   Nontender without crepitus or deformity. Abdomen: Abdomen is soft and nontender.  No evidence of intra-abdominal masses.  Positive bowel sounds noted in all quadrants.   Musculoskeletal: Significant pain with both passive and active range of motion of the right hip.  No evidence of contractures.  Poor muscle tone. Neurologic: Sensation intact.  Patient moving all 4 extremities spontaneously.  Patient intermittently following commands.  Patient is responsive to verbal stimuli.   Psychiatric: Patient exhibits odd affect with normal mood.  Patient currently does not seem to possess insight as to her current situation.  Labs on Admission: I have personally reviewed following labs and imaging studies -   CBC: Recent Labs  Lab 11/16/20 1616  WBC 11.9*  NEUTROABS 9.9*  HGB 12.4  HCT 38.4  MCV 93.7  PLT 242   Basic Metabolic Panel: Recent Labs  Lab 11/16/20 1616  NA 130*  K 3.9  CL 98  CO2 24  GLUCOSE 108*  BUN 18  CREATININE 0.68  CALCIUM 8.9   GFR: CrCl cannot be calculated (Unknown ideal weight.). Liver Function Tests: Recent Labs  Lab 11/16/20 1616  AST 20  ALT 14  ALKPHOS 72  BILITOT 0.4  PROT 6.7  ALBUMIN 3.7   No results for input(s): LIPASE, AMYLASE in the last 168 hours. No results for input(s): AMMONIA in the last 168 hours. Coagulation Profile: No results for input(s): INR, PROTIME in the last 168 hours. Cardiac Enzymes: No results for input(s): CKTOTAL, CKMB, CKMBINDEX, TROPONINI in the last 168 hours. BNP (last 3 results) No results for input(s): PROBNP in the last 8760  hours. HbA1C: No results for input(s): HGBA1C in the last 72 hours. CBG: No results for input(s): GLUCAP in the last 168 hours. Lipid Profile: No results for input(s): CHOL, HDL, LDLCALC, TRIG, CHOLHDL, LDLDIRECT in the last 72 hours. Thyroid Function Tests: No results for input(s): TSH, T4TOTAL, FREET4, T3FREE, THYROIDAB in the last 72 hours. Anemia Panel: No results for input(s): VITAMINB12, FOLATE, FERRITIN, TIBC, IRON, RETICCTPCT in the last 72 hours. Urine analysis:    Component Value Date/Time   COLORURINE YELLOW 06/26/2020 1502   APPEARANCEUR HAZY (A) 06/26/2020 1502   LABSPEC 1.016 06/26/2020 1502   PHURINE 6.0 06/26/2020 1502   GLUCOSEU NEGATIVE 06/26/2020 1502   HGBUR MODERATE (A) 06/26/2020 1502   BILIRUBINUR NEGATIVE 06/26/2020 1502   KETONESUR NEGATIVE 06/26/2020 1502   PROTEINUR 100 (A) 06/26/2020 1502   UROBILINOGEN 0.2 07/17/2010 2145   NITRITE POSITIVE (A) 06/26/2020 1502   LEUKOCYTESUR LARGE (A) 06/26/2020 1502    Radiological Exams on Admission -  Personally Reviewed: DG Chest 1 View  Result Date: 11/16/2020 CLINICAL DATA:  Fall. EXAM: CHEST  1 VIEW COMPARISON:  06/25/2020 FINDINGS: Borderline cardiomegaly, similar to prior. Unchanged mediastinal contours. No acute airspace disease, pulmonary edema, pleural effusion, or pneumothorax. No acute osseous abnormalities are seen. Scoliotic curvature of the spine. IMPRESSION: No acute findings. Borderline cardiomegaly. Electronically Signed   By: Narda Rutherford M.D.   On: 11/16/2020 16:58   DG Hip Unilat W or Wo Pelvis 2-3 Views Right  Result Date: 11/16/2020 CLINICAL DATA:  Fall today.  Right hip pain. EXAM: DG HIP (WITH OR WITHOUT PELVIS) 2-3V RIGHT COMPARISON:  Hip radiograph 06/25/2020 FINDINGS: Right hip arthroplasty in expected alignment. There is a mildly displaced fracture involving the right greater trochanter extending to the intertrochanteric portion of femoral component. No additional fracture. Pubic rami  are intact. Bones diffusely under mineralized. Pubic symphysis and sacroiliac joints are congruent. IMPRESSION: Right hip arthroplasty. There is a mildly displaced right greater trochanteric fracture extending to the intertrochanteric portion of femoral component. Electronically Signed   By: Narda Rutherford M.D.   On: 11/16/2020 16:57     Assessment/Plan Principal Problem:   Traumatic closed trochanteric fracture of femur with minimal displacement, right, initial encounter [facility])   Patient experienced what seems to be a mechanical witnessed fall resulting in trochanteric fracture in the same hip that underwent arthroplasty in 05/2020 for another fall.  After Dr. Victorino Dike reviewed the images he recommended attempts at ambulation and pain control with discharge.  Unfortunately, patient has been unable to do so and continues to require pain medication and is unable to ambulate.  We will therefore hospitalize the patient, place the patient on scheduled twice daily Tylenol and Lidoderm patches for baseline pain control with modest dosing of as needed opiate-based analgesics for severe pain.  PT evaluation ordered  Orthopedic surgery to evaluate patient tomorrow, their input is appreciated.  As I do not anticipate patient to go to the OR tomorrow will not make NPO.  Vitamin D level ordered  Active Problems:   Hyponatremia   Persistent chronic hyponatremia that was present during December 2021 and hospitalization as well  Etiology during last hospitalization seems to be a little unclear with the question of contributing psychogenic polydipsia at that time.  We will repeat hyponatremia work-up with serum osmolality, urine osmolality and serum sodium.  Once this is resulted decisions can be made on whether fluid restriction is needed.  We will keep patient euvolemic for now.    Dementia with behavioral disturbance (HCC)   Advanced disease with bouts of severe agitation noted during last  hospitalization intermittently requiring restraints  We will attempt to avoid severe agitation by treating patient with baseline analgesics including Tylenol and Lidoderm patches.  We will minimize uncomfortable stimuli otherwise  Encouraged family to remain at bedside is much as possible  Frequent redirection by nursing staff  Fall precautions  If this fails, low-dose as needed anxiolytics can be trialed.    Goals of care, counseling/discussion   Per my discussion with the son, patient has at least a 6-year history of dementia with gradual decline  I have informed him that as the patient continues to require recurrent hospitalizations her prognosis becomes more more guarded.  Son states that he believes that the patient wishes to be DNR but wishes to confirm this with his siblings prior to [place] formally placing this order.   Code Status:  Full code Family Communication: Discussed plan of care with son via  phone conversation as detailed above  Status is: Inpatient  Remains inpatient appropriate because:Ongoing active pain requiring inpatient pain management, IV treatments appropriate due to intensity of illness or inability to take PO and Inpatient level of care appropriate due to severity of illness   Dispo: The patient is from: ALF              Anticipated d/c is to: ALF              Patient currently is not medically stable to d/c.   Difficult to place patient Yes        Marinda Elk MD [facility] Pager [phone]  If 7PM-7AM, please contact night-coverage [email or web address] Use universal [place] password for that web site. If you do not have the password, please call the hospital operator.  11/16/2020, 10:22 PM

## 2020-11-17 ENCOUNTER — Other Ambulatory Visit: Payer: Self-pay

## 2020-11-17 DIAGNOSIS — S72101A Unspecified trochanteric fracture of right femur, initial encounter for closed fracture: Secondary | ICD-10-CM | POA: Diagnosis not present

## 2020-11-17 LAB — CBC WITH DIFFERENTIAL/PLATELET
Abs Immature Granulocytes: 0.03 10*3/uL (ref 0.00–0.07)
Basophils Absolute: 0 10*3/uL (ref 0.0–0.1)
Basophils Relative: 0 %
Eosinophils Absolute: 0 10*3/uL (ref 0.0–0.5)
Eosinophils Relative: 0 %
HCT: 38.4 % (ref 36.0–46.0)
Hemoglobin: 12.7 g/dL (ref 12.0–15.0)
Immature Granulocytes: 0 %
Lymphocytes Relative: 11 %
Lymphs Abs: 1.2 10*3/uL (ref 0.7–4.0)
MCH: 30.6 pg (ref 26.0–34.0)
MCHC: 33.1 g/dL (ref 30.0–36.0)
MCV: 92.5 fL (ref 80.0–100.0)
Monocytes Absolute: 1.6 10*3/uL — ABNORMAL HIGH (ref 0.1–1.0)
Monocytes Relative: 14 %
Neutro Abs: 8.1 10*3/uL — ABNORMAL HIGH (ref 1.7–7.7)
Neutrophils Relative %: 75 %
Platelets: 225 10*3/uL (ref 150–400)
RBC: 4.15 MIL/uL (ref 3.87–5.11)
RDW: 13.4 % (ref 11.5–15.5)
WBC: 11 10*3/uL — ABNORMAL HIGH (ref 4.0–10.5)
nRBC: 0 % (ref 0.0–0.2)

## 2020-11-17 LAB — COMPREHENSIVE METABOLIC PANEL
ALT: 13 U/L (ref 0–44)
AST: 20 U/L (ref 15–41)
Albumin: 3.6 g/dL (ref 3.5–5.0)
Alkaline Phosphatase: 63 U/L (ref 38–126)
Anion gap: 7 (ref 5–15)
BUN: 14 mg/dL (ref 8–23)
CO2: 27 mmol/L (ref 22–32)
Calcium: 8.9 mg/dL (ref 8.9–10.3)
Chloride: 96 mmol/L — ABNORMAL LOW (ref 98–111)
Creatinine, Ser: 0.84 mg/dL (ref 0.44–1.00)
GFR, Estimated: >60 mL/min (ref >60)
Glucose, Bld: 105 mg/dL — ABNORMAL HIGH (ref 70–99)
Potassium: 4.2 mmol/L (ref 3.5–5.1)
Sodium: 130 mmol/L — ABNORMAL LOW (ref 135–145)
Total Bilirubin: 0.4 mg/dL (ref 0.3–1.2)
Total Protein: 6.7 g/dL (ref 6.5–8.1)

## 2020-11-17 LAB — URINALYSIS, COMPLETE (UACMP) WITH MICROSCOPIC
Bilirubin Urine: NEGATIVE
Glucose, UA: NEGATIVE mg/dL
Ketones, ur: NEGATIVE mg/dL
Leukocytes,Ua: NEGATIVE
Nitrite: POSITIVE — AB
Protein, ur: NEGATIVE mg/dL
Specific Gravity, Urine: 1.01 (ref 1.005–1.030)
pH: 7 (ref 5.0–8.0)

## 2020-11-17 LAB — APTT: aPTT: 33 seconds (ref 24–36)

## 2020-11-17 LAB — PROTIME-INR
INR: 1 (ref 0.8–1.2)
Prothrombin Time: 13.4 seconds (ref 11.4–15.2)

## 2020-11-17 LAB — VITAMIN D 25 HYDROXY (VIT D DEFICIENCY, FRACTURES): Vit D, 25-Hydroxy: 17.25 ng/mL — ABNORMAL LOW (ref 30–100)

## 2020-11-17 LAB — OSMOLALITY, URINE: Osmolality, Ur: 468 mOsm/kg (ref 300–900)

## 2020-11-17 LAB — OSMOLALITY: Osmolality: 276 mOsm/kg (ref 275–295)

## 2020-11-17 MED ORDER — ENSURE ENLIVE PO LIQD
237.0000 mL | Freq: Two times a day (BID) | ORAL | Status: DC
  Administered 2020-11-17 – 2020-11-21 (×7): 237 mL via ORAL

## 2020-11-17 MED ORDER — VITAMIN D (ERGOCALCIFEROL) 1.25 MG (50000 UNIT) PO CAPS
50000.0000 [IU] | ORAL_CAPSULE | ORAL | Status: DC
  Filled 2020-11-17: qty 1

## 2020-11-17 MED ORDER — ACETAMINOPHEN 325 MG PO TABS
650.0000 mg | ORAL_TABLET | Freq: Three times a day (TID) | ORAL | Status: DC
  Administered 2020-11-18 – 2020-11-21 (×9): 650 mg via ORAL
  Filled 2020-11-17 (×11): qty 2

## 2020-11-17 NOTE — Progress Notes (Signed)
PROGRESS NOTE    Suzanne Hall  ZOX:[id number] DOB: [birth year] DOA: 11/16/2020 PCP: Patient, No Pcp Per (Inactive)   Brief Narrative: HPI per Dr. Leafy Half on 11/16/2020 87 year old female with past medical history of advanced dementia, chronic hyponatremia as well as right hip arthroplasty status post fall 05/2020 who presents to [facility] emergency department from [facility] memory care unit status post a witnessed fall by staff.  Patient is unable to provide any history due to advanced dementia.  Majority the history has been obtained from the emergency department staff and discussion with the son via phone conversation.  Earlier in the day on 5/22 at approximately 3 PM patient was witnessed to fall to the floor.  This was witnessed by care staff at the patient's facility.  They report no evidence of loss of consciousness or head trauma upon falling.  Immediately after falling patient complained of right hip and thigh pain.  Due to ongoing severe pain with inability to ambulate the patient was promptly brought to [facility] emergency department via EMS for evaluation.  Upon evaluation in the emergency department with a displaced right greater trochanteric fracture.  Case was discussed with Dr. Victorino Dike with orthopedic surgery covering for Dr. Charlann Boxer.  He recommended initial management with pain control and attempted ambulation.  If patient was able to ambulate successfully then patient could be discharged with outpatient follow-up.  Unfortunately patient's pain has been quite severe requiring multiple doses of opiate-based analgesics and patient is still unable to ambulate. Dr. Victorino Dike to see patient today.    Assessment & Plan:   Principal Problem:   Traumatic closed trochanteric fracture of femur with minimal displacement, right, initial encounter (HCC) Active Problems:   Hyponatremia   Dementia with behavioral disturbance (HCC)   Goals of care,  counseling/discussion      #1 hip greater trochanter fracture mildly displaced extending to the intertrochanteric portion of the femoral component.  Patient has had right hip arthroplasty. Ortho to see the patient. Continue pain control Seen by PT and recommending  Patient requiring IV morphine for pain control  #2 chronic hyponatremia in 130s stable  #3 dementia with behaviors  #4 vitamin D deficiency start replacement   Nutrition Problem: Severe Malnutrition Etiology: chronic illness     Signs/Symptoms: severe fat depletion,severe muscle depletion    Interventions: Ensure Enlive (each supplement provides 350kcal and 20 grams of protein)  Estimated body mass index is 18.8 kg/m as calculated from the following:   Height as of 06/26/20: 5\' 5"  (1.651 m).   Weight as of 06/26/20: 51.3 kg.  DVT prophylaxis: Lovenox  Code Status full code Family Communication: None at bedside  disposition Plan:  Status is: Inpatient  Dispo: The patient is from: SNF              Anticipated d/c is to: SNF              Patient currently is not medically stable to d/c.   Difficult to place patient No    Consultants:   Ortho  Procedures: None Antimicrobials: None Subjective: She is resting in bed she reports ports pain with movements  Objective: Vitals:   11/16/20 2210 11/17/20 0154 11/17/20 0558 11/17/20 0947  BP: (!) 163/80 (!) 167/77 (!) 143/68 132/69  Pulse: 78 79 78 74  Resp: 18 14 14 18   Temp: 98.2 F (36.8 C) 97.7 F (36.5 C) 98.2 F (36.8 C)   TempSrc: Oral Oral Oral  SpO2: 98% 100% 97% 99%    Intake/Output Summary (Last 24 hours) at 11/17/2020 1422 Last data filed at 11/17/2020 1030 Gross per 24 hour  Intake 0 ml  Output --  Net 0 ml   There were no vitals filed for this visit.  Examination:  General exam: Appears calm and comfortable  Respiratory system: Clear to auscultation. Respiratory effort normal. Cardiovascular system: S1 & S2 heard, RRR. No  JVD, murmurs, rubs, gallops or clicks. No pedal edema. Gastrointestinal system: Abdomen is nondistended, soft and nontender. No organomegaly or masses felt. Normal bowel sounds heard. Central nervous system: Alert and oriented. No focal neurological deficits. Extremities: Right hip tender  skin: No rashes, lesions or ulcers Psychiatry: Judgement and insight appear normal. Mood & affect appropriate.     Data Reviewed: I have personally reviewed following labs and imaging studies  CBC: Recent Labs  Lab 11/16/20 1616 11/17/20 0236  WBC 11.9* 11.0*  NEUTROABS 9.9* 8.1*  HGB 12.4 12.7  HCT 38.4 38.4  MCV 93.7 92.5  PLT 242 225   Basic Metabolic Panel: Recent Labs  Lab 11/16/20 1616 11/17/20 0236  NA 130* 130*  K 3.9 4.2  CL 98 96*  CO2 24 27  GLUCOSE 108* 105*  BUN 18 14  CREATININE 0.68 0.84  CALCIUM 8.9 8.9   GFR: CrCl cannot be calculated (Unknown ideal weight.). Liver Function Tests: Recent Labs  Lab 11/16/20 1616 11/17/20 0236  AST 20 20  ALT 14 13  ALKPHOS 72 63  BILITOT 0.4 0.4  PROT 6.7 6.7  ALBUMIN 3.7 3.6   No results for input(s): LIPASE, AMYLASE in the last 168 hours. No results for input(s): AMMONIA in the last 168 hours. Coagulation Profile: Recent Labs  Lab 11/17/20 0236  INR 1.0   Cardiac Enzymes: No results for input(s): CKTOTAL, CKMB, CKMBINDEX, TROPONINI in the last 168 hours. BNP (last 3 results) No results for input(s): PROBNP in the last 8760 hours. HbA1C: No results for input(s): HGBA1C in the last 72 hours. CBG: No results for input(s): GLUCAP in the last 168 hours. Lipid Profile: No results for input(s): CHOL, HDL, LDLCALC, TRIG, CHOLHDL, LDLDIRECT in the last 72 hours. Thyroid Function Tests: No results for input(s): TSH, T4TOTAL, FREET4, T3FREE, THYROIDAB in the last 72 hours. Anemia Panel: No results for input(s): VITAMINB12, FOLATE, FERRITIN, TIBC, IRON, RETICCTPCT in the last 72 hours. Sepsis Labs: No results for  input(s): PROCALCITON, LATICACIDVEN in the last 168 hours.  Recent Results (from the past 240 hour(s))  Resp Panel by RT-PCR (Flu A&B, Covid) Nasopharyngeal Swab     Status: None   Collection Time: 11/16/20  8:02 PM   Specimen: Nasopharyngeal Swab; Nasopharyngeal(NP) swabs in vial transport medium  Result Value Ref Range Status   SARS Coronavirus 2 by RT PCR NEGATIVE NEGATIVE Final    Comment: (NOTE) SARS-CoV-2 target nucleic acids are NOT DETECTED.  The SARS-CoV-2 RNA is generally detectable in upper respiratory specimens during the acute phase of infection. The lowest concentration of SARS-CoV-2 viral copies this assay can detect is 138 copies/mL. A negative result does not preclude SARS-Cov-2 infection and should not be used as the sole basis for treatment or other patient management decisions. A negative result may occur with  improper specimen collection/handling, submission of specimen other than nasopharyngeal swab, presence of viral mutation(s) within the areas targeted by this assay, and inadequate number of viral copies(<138 copies/mL). A negative result must be combined with clinical observations, patient history, and epidemiological information. The expected  result is Negative.  Fact Sheet for Patients:  BloggerCourse.com  Fact Sheet for Healthcare Providers:  SeriousBroker.it  This test is no t yet approved or cleared by the [place] FDA and  has been authorized for detection and/or diagnosis of SARS-CoV-2 by FDA under an Emergency Use Authorization (EUA). This EUA will remain  in effect (meaning this test can be used) for the duration of the COVID-19 declaration under Section 564(b)(1) of the Act, 21 U.S.C.section 360bbb-3(b)(1), unless the authorization is terminated  or revoked sooner.       Influenza A by PCR NEGATIVE NEGATIVE Final   Influenza B by PCR NEGATIVE NEGATIVE Final    Comment: (NOTE) The  Xpert Xpress SARS-CoV-2/FLU/RSV plus assay is intended as an aid in the diagnosis of influenza from Nasopharyngeal swab specimens and should not be used as a sole basis for treatment. Nasal washings and aspirates are unacceptable for Xpert Xpress SARS-CoV-2/FLU/RSV testing.  Fact Sheet for Patients: BloggerCourse.com  Fact Sheet for Healthcare Providers: SeriousBroker.it  This test is not yet approved or cleared by the [place] FDA and has been authorized for detection and/or diagnosis of SARS-CoV-2 by FDA under an Emergency Use Authorization (EUA). This EUA will remain in effect (meaning this test can be used) for the duration of the COVID-19 declaration under Section 564(b)(1) of the Act, 21 U.S.C. section 360bbb-3(b)(1), unless the authorization is terminated or revoked.  Performed at [facility], [address], [place], [place] [address]          Radiology Studies: DG Chest 1 View  Result Date: 11/16/2020 CLINICAL DATA:  Fall. EXAM: CHEST  1 VIEW COMPARISON:  06/25/2020 FINDINGS: Borderline cardiomegaly, similar to prior. Unchanged mediastinal contours. No acute airspace disease, pulmonary edema, pleural effusion, or pneumothorax. No acute osseous abnormalities are seen. Scoliotic curvature of the spine. IMPRESSION: No acute findings. Borderline cardiomegaly. Electronically Signed   By: Narda Rutherford M.D.   On: 11/16/2020 16:58   DG Hip Unilat W or Wo Pelvis 2-3 Views Right  Result Date: 11/16/2020 CLINICAL DATA:  Fall today.  Right hip pain. EXAM: DG HIP (WITH OR WITHOUT PELVIS) 2-3V RIGHT COMPARISON:  Hip radiograph 06/25/2020 FINDINGS: Right hip arthroplasty in expected alignment. There is a mildly displaced fracture involving the right greater trochanter extending to the intertrochanteric portion of femoral component. No additional fracture. Pubic rami are intact. Bones diffusely under  mineralized. Pubic symphysis and sacroiliac joints are congruent. IMPRESSION: Right hip arthroplasty. There is a mildly displaced right greater trochanteric fracture extending to the intertrochanteric portion of femoral component. Electronically Signed   By: Narda Rutherford M.D.   On: 11/16/2020 16:57        Scheduled Meds: . acetaminophen  650 mg Oral BID  . feeding supplement  237 mL Oral BID BM  . lidocaine  1 patch Transdermal Q24H  . senna-docusate  1 tablet Oral BID   Continuous Infusions:   LOS: 1 day     Alwyn Ren, MD 11/17/2020, 2:22 PM

## 2020-11-17 NOTE — Plan of Care (Signed)
POC initiated 

## 2020-11-17 NOTE — TOC Initial Note (Signed)
Transition of Care [facility]) - Initial/Assessment Note   Patient Details  Name: Suzanne Hall MRN: [id number] Date of Birth: 02/04/1933  Transition of Care [facility]) CM/SW Contact:    Ewing Schlein, LCSW Phone Number: 11/17/2020, 3:17 PM  Clinical Narrative: Patient is an 87 year old female who was admitted for right traumatic closed trochanteric fracture of femur with minimal displacement. PT evaluation recommended SNF. Ortho has been consulted.  CSW spoke with patient's sons, Annette Stable and Mylea Roarty. Family is agreeable to SNF and is agreeable to [facility] if there are no bed offers from [place], [place], etc. CSW confirmed with Shantae with [facility] that patient will need SNF prior to her return to the facility.  FL2 has been started; PASRR confirmed. TOC awaiting ortho consult to determine if surgery will be needed before the FL2 is completed.  Expected Discharge Plan: Skilled Nursing Facility Barriers to Discharge: Continued Medical Work up,SNF Pending bed offer  Patient Goals and CMS Choice Patient states their goals for this hospitalization and ongoing recovery are:: Go to SNF and then transition back to [facility] memory care CMS Medicare.gov Compare Post Acute Care list provided to:: Patient Represenative (must comment) Choice offered to / list presented to : Adult Children  Expected Discharge Plan and Services Expected Discharge Plan: Skilled Nursing Facility In-house Referral: Clinical Social Work Post Acute Care Choice: Skilled Nursing Facility Living arrangements for the past 2 months: Assisted Living Facility ([facility])             DME Arranged: N/A DME Agency: NA  Prior Living Arrangements/Services Living arrangements for the past 2 months: Assisted Living Facility ([facility]) Lives with:: Facility Resident Patient language and need for interpreter reviewed:: Yes Do you feel safe going back to the place where you live?: Yes       Need for Family Participation in Patient Care: Yes (Comment) (Patient has dementia.) Care giver support system in place?: Yes (comment) Criminal Activity/Legal Involvement Pertinent to Current Situation/Hospitalization: No - Comment as needed  Activities of Daily Living Home Assistive Devices/Equipment: Other (Comment) (unknown at this time) ADL Screening (condition at time of admission) Patient's cognitive ability adequate to safely complete daily activities?: No Is the patient deaf or have difficulty hearing?: No Does the patient have difficulty seeing, even when wearing glasses/contacts?: No Does the patient have difficulty concentrating, remembering, or making decisions?: Yes Patient able to express need for assistance with ADLs?: Yes Does the patient have difficulty dressing or bathing?: No Independently performs ADLs?: No Does the patient have difficulty walking or climbing stairs?: No Weakness of Legs: Right Weakness of Arms/Hands: None  Permission Sought/Granted Permission sought to share information with : Oceanographer granted to share information with : Yes, Verbal Permission Granted Permission granted to share info w AGENCY: SNFs  Emotional Assessment Orientation: : Oriented to Self,Oriented to Place Alcohol / Substance Use: Not Applicable Psych Involvement: No (comment)  Admission diagnosis:  Closed nondisplaced intertrochanteric fracture of right femur, initial encounter (HCC) [S72.144A] Traumatic closed trochanteric fracture of femur with minimal displacement, right, initial encounter (HCC) [S72.101A] Patient Active Problem List   Diagnosis Date Noted  . Traumatic closed trochanteric fracture of femur with minimal displacement, right, initial encounter (HCC) 11/16/2020  . Goals of care, counseling/discussion 11/16/2020  . Protein-calorie malnutrition, severe 07/03/2020  . Hyponatremia 06/25/2020  . Dementia with behavioral disturbance  (HCC) 06/25/2020  . Hip fracture (HCC) 06/25/2020   PCP:  Patient, No Pcp Per (Inactive)  Pharmacy:  No Pharmacies Listed  Readmission Risk Interventions No flowsheet data found.

## 2020-11-17 NOTE — Progress Notes (Signed)
OT Cancellation Note  Patient Details Name: Suzanne Hall MRN: [id number] DOB: May 06, 1933   Cancelled Treatment:    Reason Eval/Treat Not Completed: Patient not medically ready: Note in chart stating that Orthopedics is to return to reassess the pt as she has been unable to tolerate standing.  Will hold OT Eval for now and await any Ortho updates.   Theodoro Clock 11/17/2020, 1:30 PM

## 2020-11-17 NOTE — Progress Notes (Signed)
Report received from SBAR/HUI RN/ED Pt received to room 1332 via stretcher and transferred to bed with assist.Pt was verbally asked with NT Ledon Snare as witness for her name and date of birth as she had no ID bracelet on. ID band was then applied to pt's wrist with a falls band.  Pt was cleaned and changed into DRY clothes as was bed linen changed for dry linen. Education was initiated on use of call bell and pt's handbook was given and left at bedside.

## 2020-11-17 NOTE — Evaluation (Signed)
Physical Therapy Evaluation Patient Details Name: Suzanne Hall MRN: [id number] DOB: 30-Jan-1933 Today's Date: 11/17/2020   History of Present Illness  87 year old female with past medical history of advanced dementia, chronic hyponatremia as well as right hip arthroplasty d/t hip fx- fall in 2021 ortho consulted anrecommended non-surgical management. righ thip xray=mildly displaced right greater  trochanteric fracture extending to the intertrochanteric portion of  femoral component.  Clinical Impression  Pt admitted with above diagnosis.  Sat EOB with total assist. Pt is verbal and oriented to self,  screams in pain with initial movement then stops once on EOB. Refuses any attempt to stand, unsure how much of this is related to cognition vs pain. Pt maintains bil LEs in hip and knee flexion. Recommend SNF unless ALF able to manage pt at this level of care   Pt currently with functional limitations due to the deficits listed below (see PT Problem List). Pt will benefit from skilled PT to increase their independence and safety with mobility to allow discharge to the venue listed below.     Follow Up Recommendations SNF    Equipment Recommendations  None recommended by PT    Recommendations for Other Services       Precautions / Restrictions Precautions Precautions: Fall Restrictions Weight Bearing Restrictions: No Other Position/Activity Restrictions: WBAT RLE per ortho      Mobility  Bed Mobility Overal bed mobility: Needs Assistance Bed Mobility: Supine to Sit;Sit to Supine     Supine to sit: +2 for physical assistance;Total assist Sit to supine: Total assist;+2 for physical assistance   General bed mobility comments: pt does not self assist. screams with movement however once positioned EOB and distracted she stops screaming and resumes conversation; requires assist with trunk and LEs in both directions    Transfers                 General transfer comment: pt  refuses  Ambulation/Gait                Stairs            Wheelchair Mobility    Modified Rankin (Stroke Patients Only)       Balance Overall balance assessment: Needs assistance Sitting-balance support: Single extremity supported;Feet supported Sitting balance-Leahy Scale: Fair Sitting balance - Comments: able to maintain sitting  EOB with supervision, unable to wt shift d/t pain       Standing balance comment: pt refuses                             Pertinent Vitals/Pain Pain Assessment: Faces Faces Pain Scale: Hurts whole lot Pain Descriptors / Indicators: Grimacing;Moaning;Other (Comment) (screaming) Pain Intervention(s): Limited activity within patient's tolerance;Monitored during session;Repositioned    Home Living Family/patient expects to be discharged to:: Assisted living                 Additional Comments: pt unreliable historian. per chart, pt from assisted living    Prior Function           Comments: pt unreliable historian, limited info about prior mobility in chart. Pt from assisted living- [facility]     Hand Dominance        Extremity/Trunk Assessment   Upper Extremity Assessment Upper Extremity Assessment: Defer to OT evaluation    Lower Extremity Assessment Lower Extremity Assessment: RLE deficits/detail RLE Deficits / Details: maintains bil LEs in end range hip and knee flexion. testing  limited by cognition       Communication   Communication: No difficulties  Cognition Arousal/Alertness: Awake/alert Behavior During Therapy: Anxious Overall Cognitive Status: History of cognitive impairments - at baseline                                        General Comments      Exercises     Assessment/Plan    PT Assessment Patient needs continued PT services  PT Problem List Decreased strength;Decreased range of motion;Pain;Decreased balance;Decreased activity tolerance;Decreased  cognition;Decreased mobility;Decreased knowledge of use of DME       PT Treatment Interventions DME instruction;Therapeutic exercise;Functional mobility training;Therapeutic activities;Patient/family education;Balance training    PT Goals (Current goals can be found in the Care Plan section)  Acute Rehab PT Goals Patient Stated Goal: does not state, currently wants to stay in bed PT Goal Formulation: Patient unable to participate in goal setting Time For Goal Achievement: 12/01/20 Potential to Achieve Goals: Fair    Frequency Min 2X/week   Barriers to discharge        Co-evaluation               AM-PAC PT "6 Clicks" Mobility  Outcome Measure Help needed turning from your back to your side while in a flat bed without using bedrails?: Total Help needed moving from lying on your back to sitting on the side of a flat bed without using bedrails?: Total Help needed moving to and from a bed to a chair (including a wheelchair)?: Total Help needed standing up from a chair using your arms (e.g., wheelchair or bedside chair)?: Total Help needed to walk in hospital room?: Total Help needed climbing 3-5 steps with a railing? : Total 6 Click Score: 6    End of Session   Activity Tolerance: Patient limited by pain;Other (comment) (cognition)     PT Visit Diagnosis: Other abnormalities of gait and mobility (R26.89)    Time: 9379-0240 PT Time Calculation (min) (ACUTE ONLY): 10 min   Charges:   PT Evaluation $PT Eval Low Complexity: 1 Low          Rupert Azzara, PT  Acute Rehab Dept ([facility]) [phone] Pager [phone]  11/17/2020   [facility] 11/17/2020, 1:46 PM

## 2020-11-17 NOTE — Plan of Care (Signed)
  Problem: Pain Managment: Goal: General experience of comfort will improve Outcome: Progressing   Problem: Safety: Goal: Ability to remain free from injury will improve Outcome: Progressing   

## 2020-11-17 NOTE — Progress Notes (Signed)
Initial Nutrition Assessment  DOCUMENTATION CODES:   Severe malnutrition in context of chronic illness  INTERVENTION:   -Needs weight for admission (last recorded 06/26/20)  -Ensure Enlive po BID, each supplement provides 350 kcal and 20 grams of protein   NUTRITION DIAGNOSIS:   Severe Malnutrition related to chronic illness as evidenced by severe fat depletion,severe muscle depletion.  GOAL:   Patient will meet greater than or equal to 90% of their needs  MONITOR:   PO intake,Supplement acceptance,Labs,Weight trends,I & O's  REASON FOR ASSESSMENT:   Consult Assessment of nutrition requirement/status  ASSESSMENT:   87 year old female with past medical history of advanced dementia, chronic hyponatremia as well as right hip arthroplasty status post fall 05/2020 who presents to [facility] emergency department from [facility] memory care unit status post a witnessed fall by staff.     Patient is unable to provide any history due to advanced dementia.  Patient in room with no family at bedside. Pt states she is doing well. Currently alert/oriented x 2. Kept asking for RD to sing or tell some jokes.  Reports good appetite and eating well. Breakfast tray was not finished. Will order Ensure supplements.  Unable to assess weight status, last recorded weight is from December 2021.  Medications: Senokot  Labs reviewed: Low Na  NUTRITION - FOCUSED PHYSICAL EXAM:  Flowsheet Row Most Recent Value  Orbital Region Severe depletion  Upper Arm Region Severe depletion  Thoracic and Lumbar Region Unable to assess  Buccal Region Moderate depletion  Temple Region Severe depletion  Clavicle Bone Region Severe depletion  Clavicle and Acromion Bone Region Severe depletion  Scapular Bone Region Severe depletion  Dorsal Hand Moderate depletion  Patellar Region Unable to assess  Anterior Thigh Region Unable to assess  Posterior Calf Region Unable to assess   Edema (RD Assessment) None       Diet Order:   Diet Order            Diet regular Room service appropriate? Yes; Fluid consistency: Thin  Diet effective now                 EDUCATION NEEDS:   No education needs have been identified at this time  Skin:  Skin Assessment: Reviewed RN Assessment  Last BM:  5/22  Height:   Ht Readings from Last 1 Encounters:  06/26/20 5\' 5"  (1.651 m)    Weight:   Wt Readings from Last 1 Encounters:  06/26/20 51.3 kg    BMI:  There is no height or weight on file to calculate BMI.  Estimated Nutritional Needs:   Kcal:  1500-1700  Protein:  70-85g  Fluid:  1.7L/day  06/28/20, MS, RD, LDN Inpatient Clinical Dietitian Contact information available via Amion

## 2020-11-18 DIAGNOSIS — S72101A Unspecified trochanteric fracture of right femur, initial encounter for closed fracture: Secondary | ICD-10-CM | POA: Diagnosis not present

## 2020-11-18 MED ORDER — POLYETHYLENE GLYCOL 3350 17 G PO PACK
17.0000 g | PACK | Freq: Every day | ORAL | Status: DC
  Administered 2020-11-18 – 2020-11-21 (×4): 17 g via ORAL
  Filled 2020-11-18 (×4): qty 1

## 2020-11-18 MED ORDER — DOCUSATE SODIUM 100 MG PO CAPS
200.0000 mg | ORAL_CAPSULE | Freq: Every day | ORAL | Status: DC
  Administered 2020-11-18 – 2020-11-21 (×4): 200 mg via ORAL
  Filled 2020-11-18 (×5): qty 2

## 2020-11-18 MED ORDER — TRAMADOL HCL 50 MG PO TABS
50.0000 mg | ORAL_TABLET | Freq: Four times a day (QID) | ORAL | Status: DC | PRN
  Administered 2020-11-19 (×2): 50 mg via ORAL
  Filled 2020-11-18 (×4): qty 1

## 2020-11-18 NOTE — Consult Note (Signed)
Reason for Consult: right hip (periprosthetic) fracture  Referring Physician: Jerolyn Center, MD  Suzanne Hall is an 87 y.o. female.  HPI: 87 year old female with past medical history of advanced dementia, chronic hyponatremia as well as right hip arthroplasty status post fall 05/2020 who presents to [facility] emergency department from [facility] memory care unit status post a witnessed fall by staff.  Patient is unable to provide any history due to advanced dementia.  Majority the history has been obtained from the emergency department staff and discussion with the son via phone conversation.  Earlier in the day on 5/22 at approximately 3 PM patient was witnessed to fall to the floor.  This was witnessed by care staff at the patient's facility.  They report no evidence of loss of consciousness or head trauma upon falling.  Immediately after falling patient complained of right hip and thigh pain.  Due to ongoing severe pain with inability to ambulate the patient was promptly brought to [facility] emergency department via EMS for evaluation.  Upon evaluation in the emergency department with a displaced right greater trochanteric fracture.  Case was discussed with Dr. Victorino Dike with orthopedic surgery covering for Dr. Charlann Boxer.  He recommended initial management with pain control and attempted ambulation.  If patient was able to ambulate successfully then patient could be discharged with outpatient follow-up.  Unfortunately patient's pain has been quite severe requiring multiple doses of opiate-based analgesics and patient is still unable to ambulate.  Therefore Dr. Victorino Dike stated that he will see the patient tomorrow in consultation.  The hospitalist group has now been called to assess the patient for admission to the hospital.  Past Medical History:  Diagnosis Date  . CKD (chronic kidney disease)   . Dementia (HCC)   . Hyperlipidemia   . Osteopenia   . Renal disorder      Past Surgical History:  Procedure Laterality Date  . HIP ARTHROPLASTY Right 06/26/2020   Procedure: ARTHROPLASTY BIPOLAR HIP (HEMIARTHROPLASTY);  Surgeon: Durene Romans, MD;  Location: [facility] OR;  Service: Orthopedics;  Laterality: Right;    Family History  Problem Relation Age of Onset  . Heart disease Father     Social History:  reports that she has never smoked. She has never used smokeless tobacco. She reports that she does not drink alcohol and does not use drugs.  Allergies: No Known Allergies  Medications:  I have reviewed the patient's current medications. Scheduled: . acetaminophen  650 mg Oral TID  . feeding supplement  237 mL Oral BID BM  . lidocaine  1 patch Transdermal Q24H  . senna-docusate  1 tablet Oral BID  . Vitamin D (Ergocalciferol)  50,000 Units Oral Q7 days    No results found for this or any previous visit (from the past 24 hour(s)).   X-ray: CLINICAL DATA:  Fall today.  Right hip pain.  EXAM: DG HIP (WITH OR WITHOUT PELVIS) 2-3V RIGHT  COMPARISON:  Hip radiograph 06/25/2020  FINDINGS: Right hip arthroplasty in expected alignment. There is a mildly displaced fracture involving the right greater trochanter extending to the intertrochanteric portion of femoral component. No additional fracture. Pubic rami are intact. Bones diffusely under mineralized. Pubic symphysis and sacroiliac joints are congruent.  IMPRESSION: Right hip arthroplasty. There is a mildly displaced right greater trochanteric fracture extending to the intertrochanteric portion of femoral component.   Electronically Signed   By: Narda Rutherford M.D.  ROS  As per HPI complicated by advanced dementia  Blood pressure 137/73, pulse 81, temperature 97.8 F (36.6 C), temperature source Oral, resp. rate 16, SpO2 97 %.  Physical Exam : Constitutional: Awake and alert, mild distress due to right hip pain.   Skin: no rashes, no lesions, somewhat poor skin turgor  noted. Eyes: Pupils are equally reactive to light.  No evidence of scleral icterus or conjunctival pallor.  ENMT: Moist mucous membranes noted.  Posterior pharynx clear of any exudate or lesions.   Neck: normal, supple, no masses, no thyromegaly.  No evidence of jugular venous distension.   Respiratory: clear to auscultation bilaterally, no wheezing, no crackles. Normal respiratory effort. No accessory muscle use.  Cardiovascular: Regular rate and rhythm, no murmurs / rubs / gallops. No extremity edema. 2+ pedal pulses. No carotid bruits.  Chest:   Nontender without crepitus or deformity.   Back:   Nontender without crepitus or deformity. Abdomen: Abdomen is soft and nontender.  No evidence of intra-abdominal masses.  Positive bowel sounds noted in all quadrants.   Musculoskeletal: Pain with attempted motion of right hip as well as to palpation laterally.  No evidence of contractures.  Poor muscle tone. Neurologic: Sensation intact.  Patient moving all 4 extremities spontaneously.  Patient intermittently following commands.  Patient is responsive to verbal stimuli.   Psychiatric: Patient exhibits odd affect with normal mood.  Patient currently does not seem to possess insight as to her current situation.   Assessment/Plan: 1. Mildly displaced closed right greater trochanteric periprosthetic proximal fracture with stable prosthesis  Plan: This injury can be treated non-operativley Due to memory issues she can be weight bearing as tolerated with walker at all times for transfers and limited walking Follow up can be arranged but also can be optional given her medical co-morbidities I will review all with her son Limit narcotic pain relief due to underlying dementia  [name] 11/18/2020, 6:53 AM

## 2020-11-18 NOTE — Progress Notes (Signed)
Patient refused vitals. Staff will try again before end of shift.

## 2020-11-18 NOTE — NC FL2 (Signed)
[place] MEDICAID FL2 LEVEL OF CARE SCREENING TOOL     IDENTIFICATION  Patient Name: Suzanne Hall Birthdate: 07-Dec-1932 Sex: female Admission Date (Current Location): 11/16/2020  [facility] and [place] Number:  Producer, television/film/video and Address:  [facility],  [address], [place] [address]      Provider Number: [id number]  Attending Physician Name and Address:  Alwyn Ren, MD  Relative Name and Phone Number:  Myrene Bougher (son) Ph: [phone]    Current Level of Care: Hospital Recommended Level of Care: Skilled Nursing Facility Prior Approval Number:    Date Approved/Denied:   PASRR Number: [id number]  Discharge Plan: SNF    Current Diagnoses: Patient Active Problem List   Diagnosis Date Noted  . Traumatic closed trochanteric fracture of femur with minimal displacement, right, initial encounter (HCC) 11/16/2020  . Goals of care, counseling/discussion 11/16/2020  . Protein-calorie malnutrition, severe 07/03/2020  . Hyponatremia 06/25/2020  . Dementia with behavioral disturbance (HCC) 06/25/2020  . Hip fracture (HCC) 06/25/2020    Orientation RESPIRATION BLADDER Height & Weight     Self,Place  Normal Incontinent Weight:   Height:     BEHAVIORAL SYMPTOMS/MOOD NEUROLOGICAL BOWEL NUTRITION STATUS      Incontinent Diet (Regular diet)  AMBULATORY STATUS COMMUNICATION OF NEEDS Skin   Extensive Assist Verbally Normal                       Personal Care Assistance Level of Assistance  Bathing,Feeding,Dressing Bathing Assistance: Limited assistance Feeding assistance: Limited assistance Dressing Assistance: Limited assistance     Functional Limitations Info  Sight,Hearing,Speech Sight Info: Adequate Hearing Info: Adequate Speech Info: Adequate    SPECIAL CARE FACTORS FREQUENCY  PT (By licensed PT),OT (By licensed OT)     PT Frequency: 5x's/week OT Frequency: 5x's/week            Contractures Contractures Info: Not  present    Additional Factors Info  Code Status Code Status Info: DNR Allergies Info: NKA           Current Medications (11/18/2020):  This is the current hospital active medication list Current Facility-Administered Medications  Medication Dose Route Frequency Provider Last Rate Last Admin  . acetaminophen (TYLENOL) tablet 650 mg  650 mg Oral Q4H PRN Shalhoub, Deno Lunger, MD      . acetaminophen (TYLENOL) tablet 650 mg  650 mg Oral TID Alwyn Ren, MD   650 mg at 11/18/20 0945  . feeding supplement (ENSURE ENLIVE / ENSURE PLUS) liquid 237 mL  237 mL Oral BID BM Alwyn Ren, MD   237 mL at 11/18/20 0944  . lidocaine (LIDODERM) 5 % 1 patch  1 patch Transdermal Q24H Shalhoub, Deno Lunger, MD   1 patch at 11/17/20 2334  . morphine 2 MG/ML injection 1 mg  1 mg Intravenous Q4H PRN Shalhoub, Deno Lunger, MD       Or  . morphine 2 MG/ML injection 2 mg  2 mg Intravenous Q4H PRN Shalhoub, Deno Lunger, MD      . ondansetron [facility]) injection 4 mg  4 mg Intravenous Q6H PRN Shalhoub, Deno Lunger, MD      . polyethylene glycol (MIRALAX / GLYCOLAX) packet 17 g  17 g Oral Daily PRN Shalhoub, Deno Lunger, MD      . senna-docusate (Senokot-S) tablet 1 tablet  1 tablet Oral BID Marinda Elk, MD   1 tablet at 11/18/20 0945  . Vitamin D (Ergocalciferol) (DRISDOL)  capsule 50,000 Units  50,000 Units Oral Q7 days Alwyn Ren, MD         Discharge Medications: Please see discharge summary for a list of discharge medications.  Relevant Imaging Results:  Relevant Lab Results:   Additional Information SSN: [id number]  Amada Jupiter, LCSW

## 2020-11-18 NOTE — Evaluation (Signed)
Occupational Therapy Evaluation Patient Details Name: Suzanne Hall MRN: [id number] DOB: 05-05-1933 Today's Date: 11/18/2020    History of Present Illness 87 year old female with past medical history of advanced dementia, chronic hyponatremia as well as right hip arthroplasty d/t hip fx- fall in 2021 ortho consulted anrecommended non-surgical management. righ thip xray=mildly displaced right greater  trochanteric fracture extending to the intertrochanteric portion of  femoral component.   Clinical Impression   Pt admitted with the above diagnosis and has the deficits listed below. Pt would benefit from cont OT to increase ability to participate in adl transfers and adls so she can return to the memory unit at [facility]. Pt may need SNF rehab prior to returning to assisted living if they cannot handle this level of care. Will continue to focus on transfers and mobilization.    Follow Up Recommendations  SNF;Supervision/Assistance - 24 hour    Equipment Recommendations  Other (comment) (tbd)    Recommendations for Other Services       Precautions / Restrictions Precautions Precautions: Fall Restrictions Weight Bearing Restrictions: Yes Other Position/Activity Restrictions: WBAT RLE per ortho      Mobility Bed Mobility   Bed Mobility: Sidelying to Sit     Supine to sit: Max assist     General bed mobility comments: Pt very resistant to bed mobility due to pain. Once pt was moving, pt stopped screaming and did assist moving to EOB and into chair.    Transfers Overall transfer level: Needs assistance Equipment used: 1 person hand held assist Transfers: Sit to/from [employer] Sit to Stand: Mod assist Stand pivot transfers: Mod assist       General transfer comment: Pt pushed up from bed.  Pivot to chair and pt able to move feet on second transfer.    Balance Overall balance assessment: Needs assistance Sitting-balance support: Feet  supported Sitting balance-Leahy Scale: Fair Sitting balance - Comments: able to maintain sitting  EOB with supervision, unable to wt shift d/t pain   Standing balance support: Bilateral upper extremity supported;During functional activity Standing balance-Leahy Scale: Poor Standing balance comment: Pt dependent on outside support to stand.                           ADL either performed or assessed with clinical judgement   ADL Overall ADL's : Needs assistance/impaired Eating/Feeding: Minimal assistance;Sitting Eating/Feeding Details (indicate cue type and reason): pt has not been eating much lately but can feed herself. Grooming: Wash/dry hands;Wash/dry face;Oral care;Minimal assistance;Sitting   Upper Body Bathing: Moderate assistance;Sitting   Lower Body Bathing: Maximal assistance;Sit to/from stand;Cueing for compensatory techniques   Upper Body Dressing : Moderate assistance;Sitting   Lower Body Dressing: Maximal assistance;Sit to/from stand;Cueing for compensatory techniques   Toilet Transfer: Moderate assistance;BSC;RW;Stand-pivot   Toileting- Clothing Manipulation and Hygiene: Total assistance;+2 for physical assistance;Sit to/from stand Toileting - Clothing Manipulation Details (indicate cue type and reason): therapist stood with pt while nursing changed pull up and cleaened pt.     Functional mobility during ADLs: Moderate assistance;Rolling walker General ADL Comments: Pt very limited with adls due to pain and dementia. Pt is a fall risk due to dementia and forgetting that she broke her hip.     Vision Baseline Vision/History: No visual deficits Patient Visual Report: No change from baseline Vision Assessment?: No apparent visual deficits     Perception Perception Perception Tested?: No   Praxis Praxis Praxis tested?: Within functional limits  Pertinent Vitals/Pain Pain Assessment: Faces Faces Pain Scale: Hurts whole lot Pain Location: R  hip Pain Descriptors / Indicators: Grimacing;Moaning;Other (Comment) Pain Intervention(s): Monitored during session;Ice applied;Limited activity within patient's tolerance;Repositioned;Premedicated before session     Hand Dominance Right   Extremity/Trunk Assessment Upper Extremity Assessment Upper Extremity Assessment: Overall WFL for tasks assessed   Lower Extremity Assessment Lower Extremity Assessment: Defer to PT evaluation   Cervical / Trunk Assessment Cervical / Trunk Assessment: Kyphotic   Communication Communication Communication: No difficulties   Cognition Arousal/Alertness: Awake/alert Behavior During Therapy: Anxious Overall Cognitive Status: History of cognitive impairments - at baseline                                 General Comments: Pt yelled when first movng but after getting to EOB pt much more cooperativge.   General Comments  Pt did better as the session went on.  Requires mod to max assist with all mobility due to pain.  When distracted from her pain, pt was participatory.    Exercises     Shoulder Instructions      Home Living Family/patient expects to be discharged to:: Assisted living                                 Additional Comments: history from dauhter in law      Prior Functioning/Environment Level of Independence: Needs assistance        Comments: Pt walked in assisted living sometimes with walker sometimes without.        OT Problem List: Decreased strength;Decreased range of motion;Impaired balance (sitting and/or standing);Decreased knowledge of use of DME or AE;Pain      OT Treatment/Interventions: Self-care/ADL training;Therapeutic activities;DME and/or AE instruction    OT Goals(Current goals can be found in the care plan section) Acute Rehab OT Goals Patient Stated Goal: none stated OT Goal Formulation: With patient/family Time For Goal Achievement: 12/02/20 Potential to Achieve Goals:  Fair ADL Goals Pt Will Perform Grooming: with min assist;standing Pt Will Perform Upper Body Bathing: with min assist;sitting Pt Will Perform Lower Body Bathing: with min assist;sit to/from stand Pt Will Transfer to Toilet: with min assist;with +2 assist;bedside commode Pt Will Perform Tub/Shower Transfer: Shower transfer;shower seat;Stand pivot transfer;rolling walker  OT Frequency: Min 2X/week   Barriers to D/C: Decreased caregiver support  Pt lives in [facility] Unit       Co-evaluation              AM-PAC OT "6 Clicks" Daily Activity     Outcome Measure Help from another person eating meals?: A Little Help from another person taking care of personal grooming?: A Little Help from another person toileting, which includes using toliet, bedpan, or urinal?: Total Help from another person bathing (including washing, rinsing, drying)?: A Lot Help from another person to put on and taking off regular upper body clothing?: A Lot Help from another person to put on and taking off regular lower body clothing?: Total 6 Click Score: 12   End of Session Equipment Utilized During Treatment: Rolling walker Nurse Communication: Mobility status  Activity Tolerance: Patient limited by pain Patient left: in chair;with call bell/phone within reach;with chair alarm set;with family/visitor present  OT Visit Diagnosis: Unsteadiness on feet (R26.81);Other abnormalities of gait and mobility (R26.89);Pain;History of falling (Z91.81) Pain - Right/Left: Right Pain - part of  body: Hip                Time: 1638-4665 OT Time Calculation (min): 36 min Charges:  OT General Charges $OT Visit: 1 Visit OT Evaluation $OT Eval Moderate Complexity: 1 Mod OT Treatments $Self Care/Home Management : 8-22 mins  Hope Budds 11/18/2020, 12:31 PM

## 2020-11-18 NOTE — Progress Notes (Signed)
PROGRESS NOTE    Suzanne Hall  IWP:[id number] DOB: 1932-08-13 DOA: 11/16/2020 PCP: Suzanne Hall, No Pcp Per (Inactive)   Brief Narrative: This is a 87 year old female admitted from memory care unit status post witnessed fall by the staff.  Suzanne Hall had a witnessed fall on 11/16/2020 staff reported no loss of consciousness or head trauma.  Upon evaluation in the emergency department with a displaced right greater trochanteric fracture.  Case was discussed with Dr. Victorino Dike with orthopedic surgery covering for Dr. Charlann Boxer.  He recommended initial management with pain control and attempted ambulation.  If Suzanne Hall was able to ambulate successfully then Suzanne Hall could be discharged with outpatient follow-up.  Unfortunately Suzanne Hall's pain has been quite severe requiring multiple doses of opiate-based analgesics and Suzanne Hall is still unable to ambulate.  Therefore Dr. Victorino Dike stated that Suzanne Hall be admitted to the hospital and they will see the Suzanne Hall in consultation.      Assessment & Plan:   Principal Problem:   Traumatic closed trochanteric fracture of femur with minimal displacement, right, initial encounter (HCC) Active Problems:   Hyponatremia   Dementia with behavioral disturbance (HCC)   Goals of care, counseling/discussion   #1 status post fall with mildly displaced right greater trochanteric fracture extending to the intertrochanteric portion of the femoral component.  Seen by Ortho and recommending nonsurgical approach, weightbearing as tolerated Discussed with her son Suzanne Stable Hall who told me that Dr. Victorino Dike will see his mother tomorrow and will come to a final decision as to where we should go from here. I have put her on Tylenol 500 mg 3 times a day with lidocaine patch and tramadol 50 mg every 6 as needed. Bowel regime with MiraLAX and Colace. Stopped IV morphine.  #2 chronic hyponatremia in 130s stable  #3 dementia with behaviors  #4 vitamin D deficiency started replacement   Nutrition  Problem: Severe Malnutrition Etiology: chronic illness     Signs/Symptoms: severe fat depletion,severe muscle depletion    Interventions: Ensure Enlive (each supplement provides 350kcal and 20 grams of protein)  Estimated body mass index is 18.8 kg/m as calculated from the following:   Height as of 06/26/20: 5\' 5"  (1.651 m).   Weight as of 06/26/20: 51.3 kg.  DVT prophylaxis: Lovenox  Code Status full code Family Communication: Discussed with son roxann vierra disposition Plan:  Status is: Inpatient  Dispo: The Suzanne Hall is from: SNF              Anticipated d/c is to: SNF              Suzanne Hall currently is not medically stable to d/c.   Difficult to place Suzanne Hall No    Consultants:   Ortho  Procedures: None Antimicrobials: None Subjective:  She is resting in bed denies any pain while she is not moving   Objective: Vitals:   11/17/20 0154 11/17/20 0558 11/17/20 0947 11/17/20 2252  BP: (!) 167/77 (!) 143/68 132/69 137/73  Pulse: 79 78 74 81  Resp: 14 14 18 16   Temp: 97.7 F (36.5 C) 98.2 F (36.8 C)  97.8 F (36.6 C)  TempSrc: Oral Oral  Oral  SpO2: 100% 97% 99% 97%    Intake/Output Summary (Last 24 hours) at 11/18/2020 1303 Last data filed at 11/18/2020 0502 Gross per 24 hour  Intake --  Output 545 ml  Net -545 ml   There were no vitals filed for this visit.  Examination:  General exam: Appears calm and comfortable  Respiratory system: Clear to  auscultation. Respiratory effort normal. Cardiovascular system: S1 & S2 heard, RRR. No JVD, murmurs, rubs, gallops or clicks. No pedal edema. Gastrointestinal system: Abdomen is nondistended, soft and nontender. No organomegaly or masses felt. Normal bowel sounds heard. Central nervous system: Awake oriented to hospital no focal neurological deficits. Extremities: Right hip tender  skin: No rashes, lesions or ulcers Psychiatry: Judgement and insight appear normal. Mood & affect appropriate.     Data Reviewed:  I have personally reviewed following labs and imaging studies  CBC: Recent Labs  Lab 11/16/20 1616 11/17/20 0236  WBC 11.9* 11.0*  NEUTROABS 9.9* 8.1*  HGB 12.4 12.7  HCT 38.4 38.4  MCV 93.7 92.5  PLT 242 225   Basic Metabolic Panel: Recent Labs  Lab 11/16/20 1616 11/17/20 0236  NA 130* 130*  K 3.9 4.2  CL 98 96*  CO2 24 27  GLUCOSE 108* 105*  BUN 18 14  CREATININE 0.68 0.84  CALCIUM 8.9 8.9   GFR: CrCl cannot be calculated (Unknown ideal weight.). Liver Function Tests: Recent Labs  Lab 11/16/20 1616 11/17/20 0236  AST 20 20  ALT 14 13  ALKPHOS 72 63  BILITOT 0.4 0.4  PROT 6.7 6.7  ALBUMIN 3.7 3.6   No results for input(s): LIPASE, AMYLASE in the last 168 hours. No results for input(s): AMMONIA in the last 168 hours. Coagulation Profile: Recent Labs  Lab 11/17/20 0236  INR 1.0   Cardiac Enzymes: No results for input(s): CKTOTAL, CKMB, CKMBINDEX, TROPONINI in the last 168 hours. BNP (last 3 results) No results for input(s): PROBNP in the last 8760 hours. HbA1C: No results for input(s): HGBA1C in the last 72 hours. CBG: No results for input(s): GLUCAP in the last 168 hours. Lipid Profile: No results for input(s): CHOL, HDL, LDLCALC, TRIG, CHOLHDL, LDLDIRECT in the last 72 hours. Thyroid Function Tests: No results for input(s): TSH, T4TOTAL, FREET4, T3FREE, THYROIDAB in the last 72 hours. Anemia Panel: No results for input(s): VITAMINB12, FOLATE, FERRITIN, TIBC, IRON, RETICCTPCT in the last 72 hours. Sepsis Labs: No results for input(s): PROCALCITON, LATICACIDVEN in the last 168 hours.  Recent Results (from the past 240 hour(s))  Resp Panel by RT-PCR (Flu A&B, Covid) Nasopharyngeal Swab     Status: None   Collection Time: 11/16/20  8:02 PM   Specimen: Nasopharyngeal Swab; Nasopharyngeal(NP) swabs in vial transport medium  Result Value Ref Range Status   SARS Coronavirus 2 by RT PCR NEGATIVE NEGATIVE Final    Comment: (NOTE) SARS-CoV-2 target  nucleic acids are NOT DETECTED.  The SARS-CoV-2 RNA is generally detectable in upper respiratory specimens during the acute phase of infection. The lowest concentration of SARS-CoV-2 viral copies this assay can detect is 138 copies/mL. A negative result does not preclude SARS-Cov-2 infection and should not be used as the sole basis for treatment or other Suzanne Hall management decisions. A negative result may occur with  improper specimen collection/handling, submission of specimen other than nasopharyngeal swab, presence of viral mutation(s) within the areas targeted by this assay, and inadequate number of viral copies(<138 copies/mL). A negative result must be combined with clinical observations, Suzanne Hall history, and epidemiological information. The expected result is Negative.  Fact Sheet for Patients:  BloggerCourse.com  Fact Sheet for Healthcare Providers:  SeriousBroker.it  This test is no t yet approved or cleared by the [place] FDA and  has been authorized for detection and/or diagnosis of SARS-CoV-2 by FDA under an Emergency Use Authorization (EUA). This EUA will remain  in effect (meaning  this test can be used) for the duration of the COVID-19 declaration under Section 564(b)(1) of the Act, 21 U.S.C.section 360bbb-3(b)(1), unless the authorization is terminated  or revoked sooner.       Influenza A by PCR NEGATIVE NEGATIVE Final   Influenza B by PCR NEGATIVE NEGATIVE Final    Comment: (NOTE) The Xpert Xpress SARS-CoV-2/FLU/RSV plus assay is intended as an aid in the diagnosis of influenza from Nasopharyngeal swab specimens and should not be used as a sole basis for treatment. Nasal washings and aspirates are unacceptable for Xpert Xpress SARS-CoV-2/FLU/RSV testing.  Fact Sheet for Patients: BloggerCourse.com  Fact Sheet for Healthcare  Providers: SeriousBroker.it  This test is not yet approved or cleared by the [place] FDA and has been authorized for detection and/or diagnosis of SARS-CoV-2 by FDA under an Emergency Use Authorization (EUA). This EUA will remain in effect (meaning this test can be used) for the duration of the COVID-19 declaration under Section 564(b)(1) of the Act, 21 U.S.C. section 360bbb-3(b)(1), unless the authorization is terminated or revoked.  Performed at [facility], [address] [place], [place] [address]          Radiology Studies: DG Chest 1 View  Result Date: 11/16/2020 CLINICAL DATA:  Fall. EXAM: CHEST  1 VIEW COMPARISON:  06/25/2020 FINDINGS: Borderline cardiomegaly, similar to prior. Unchanged mediastinal contours. No acute airspace disease, pulmonary edema, pleural effusion, or pneumothorax. No acute osseous abnormalities are seen. Scoliotic curvature of the spine. IMPRESSION: No acute findings. Borderline cardiomegaly. Electronically Signed   By: Narda Rutherford M.D.   On: 11/16/2020 16:58   DG Hip Unilat W or Wo Pelvis 2-3 Views Right  Result Date: 11/16/2020 CLINICAL DATA:  Fall today.  Right hip pain. EXAM: DG HIP (WITH OR WITHOUT PELVIS) 2-3V RIGHT COMPARISON:  Hip radiograph 06/25/2020 FINDINGS: Right hip arthroplasty in expected alignment. There is a mildly displaced fracture involving the right greater trochanter extending to the intertrochanteric portion of femoral component. No additional fracture. Pubic rami are intact. Bones diffusely under mineralized. Pubic symphysis and sacroiliac joints are congruent. IMPRESSION: Right hip arthroplasty. There is a mildly displaced right greater trochanteric fracture extending to the intertrochanteric portion of femoral component. Electronically Signed   By: Narda Rutherford M.D.   On: 11/16/2020 16:57        Scheduled Meds: . acetaminophen  650 mg Oral TID  . feeding  supplement  237 mL Oral BID BM  . lidocaine  1 patch Transdermal Q24H  . senna-docusate  1 tablet Oral BID  . Vitamin D (Ergocalciferol)  50,000 Units Oral Q7 days   Continuous Infusions:   LOS: 2 days     Alwyn Ren, MD 11/18/2020, 1:03 PM

## 2020-11-19 DIAGNOSIS — E871 Hypo-osmolality and hyponatremia: Secondary | ICD-10-CM | POA: Diagnosis not present

## 2020-11-19 DIAGNOSIS — G309 Alzheimer's disease, unspecified: Secondary | ICD-10-CM | POA: Diagnosis not present

## 2020-11-19 DIAGNOSIS — S72101A Unspecified trochanteric fracture of right femur, initial encounter for closed fracture: Secondary | ICD-10-CM | POA: Diagnosis not present

## 2020-11-19 DIAGNOSIS — F0281 Dementia in other diseases classified elsewhere with behavioral disturbance: Secondary | ICD-10-CM | POA: Diagnosis not present

## 2020-11-19 NOTE — Plan of Care (Signed)
Plan of care reviewed. 

## 2020-11-19 NOTE — Progress Notes (Signed)
PROGRESS NOTE  DOREATHA OFFER VOZ:[id number] DOB: 04-17-1933 DOA: 11/16/2020 PCP: Patient, No Pcp Per (Inactive)  Brief History    87 year old woman PMH advanced dementia, chronic hyponatremia presented from [facility] at memory care status post fall witnessed by staff.  No loss of consciousness or head trauma.  Complains of right hip and thigh pain.  Found to have a right greater trochanteric fracture.  Nonoperative management recommended, pain control, ambulate as tolerated.  However was unable to ambulate and so was admitted for pain control.  Seen by orthopedics with recommendation for nonoperative management.  A & P  Closed trochanteric fracture right status post mechanical fall, same hip status post arthroplasty December 2021 --Nonoperative management per orthopedics.  Pain control but will limit narcotics if possible.  Due to memory issues she can be weight bearing as tolerated with walker at all times for transfers and limited walking --Follow-up with orthopedics as an outpatient  Chronic hyponatremia etiology unclear -- Stable. 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  Add bouts of severe agitation last hospitalization requiring restraints. --Delirium precautions     Nutritional Assessment: There is no height or weight on file to calculate BMI.. Seen by dietician.  I agree with the assessment and plan as outlined below: Nutrition Status: Nutrition Problem: Severe Malnutrition Etiology: chronic illness Signs/Symptoms: severe fat depletion,severe muscle depletion Interventions: Ensure Enlive (each supplement provides 350kcal and 20 grams of protein)  Disposition Plan:  Discussion: Plan for SNF  Status is: Inpatient  Remains inpatient appropriate because:Inpatient level of care appropriate due to severity of illness  Dispo: The patient is from: ALF              Anticipated d/c is to:  SNF              Patient currently is medically stable to d/c.   Difficult to place patient No  DVT prophylaxis: SCDs Start: 11/16/20 2220   Code Status: DNR Level of care: Med-Surg Family Communication: son, granddaughter at bedside  Brendia Sacks, MD  [facility] Direct contact: see [email or web address] (further directions at bottom of note if needed) 7PM-7AM contact night coverage as at bottom of note 11/19/2020, 7:24 PM  LOS: 3 days   Significant Hospital Events   .    Consults:  . orthopedics   Procedures:  .   Significant Diagnostic Tests:  Marland Kitchen    Micro Data:  .    Antimicrobials:  .   Interval History/Subjective  CC: f/u.  Periprosthetic right hip fracture  Feels okay today.  No pain.  Objective   Vitals:  Vitals:   11/19/20 0541 11/19/20 1351  BP: 130/68 (!) 101/55  Pulse: 74 75  Resp: 16 16  Temp: 98.1 F (36.7 C) 98.4 F (36.9 C)  SpO2: 98% 97%    Exam:  Constitutional:   . Appears calm and comfortable ENMT:  . grossly normal hearing  Respiratory:  . CTA bilaterally, no w/r/r.  . Respiratory effort normal.  Cardiovascular:  . RRR, no m/r/g . No LE extremity edema   Psychiatric:  . Mental status o Mood, affect appropriate  I have personally reviewed the following:   Today's Data  . No new data  Scheduled Meds: . acetaminophen  650 mg Oral TID  . docusate sodium  200 mg Oral Daily  . feeding supplement  237 mL Oral BID BM  . lidocaine  1 patch Transdermal Q24H  . polyethylene glycol  17 g Oral Daily  . senna-docusate  1 tablet Oral BID  . Vitamin D (Ergocalciferol)  50,000 Units Oral Q7 days   Continuous Infusions:  Principal Problem:   Traumatic closed trochanteric fracture of femur with minimal displacement, right, initial encounter (HCC) Active Problems:   Hyponatremia   Dementia with behavioral disturbance (HCC)   Goals of care, counseling/discussion   LOS: 3 days   How to contact the [facility] Attending or Consulting  provider 7A - 7P or covering provider during after hours 7P -7A, for this patient?  1. Check the care team in [facility] and look for a) attending/consulting TR[facility] provider listed and b) the [facility] team listed 2. Log into [email or web address] and use [place]'s universal password to access. If you do not have the password, please contact the hospital operator. 3. Locate the [facility] provider you are looking for under [facility] and page to a number that you can be directly reached. 4. If you still have difficulty reaching the provider, please page the [facility] (Director on Call) for the Hospitalists listed on amion for assistance.

## 2020-11-19 NOTE — Hospital Course (Addendum)
87 year old woman PMH advanced dementia, chronic hyponatremia presented from [facility] at memory care status post fall witnessed by staff.  No loss of consciousness or head trauma.  Complains of right hip and thigh pain.  Found to have a right greater trochanteric fracture.  Nonoperative management recommended, pain control, ambulate as tolerated.  However was unable to ambulate and so was admitted for pain control.  Seen by orthopedics with recommendation for nonoperative management.  Plans were for discharge 5/26, however she was found to have gross deformity of the right leg.  Imaging confirmed right hip dislocation.  Plans are for the OR this evening to reduce the hip.  A & P  Closed trochanteric fracture right status post mechanical fall, same hip status post arthroplasty December 2021 --Nonoperative management per orthopedics.  Pain control but will limit narcotics if possible.   -- Obvious gross deformity today with new right hip dislocation.  Plan for operative reduction as per Dr. Charlann Boxer.  I discussed in detail with Dr. Charlann Boxer this morning and formulated the diagnostic plan.  Right hip dislocation prosthetic --Management per orthopedics.  Chronic hyponatremia etiology unclear -- Stable. 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  Add bouts of severe agitation last hospitalization requiring restraints. --Delirium precautions

## 2020-11-20 ENCOUNTER — Inpatient Hospital Stay (HOSPITAL_COMMUNITY): Payer: Medicare Other | Admitting: Certified Registered Nurse Anesthetist

## 2020-11-20 ENCOUNTER — Encounter (HOSPITAL_COMMUNITY)
Admission: EM | Disposition: A | Discharge status: Skilled Nursing Facility | Payer: Self-pay | Source: Skilled Nursing Facility | Attending: Family Medicine

## 2020-11-20 ENCOUNTER — Encounter (HOSPITAL_COMMUNITY): Payer: Self-pay | Admitting: Internal Medicine

## 2020-11-20 ENCOUNTER — Inpatient Hospital Stay (HOSPITAL_COMMUNITY): Payer: Medicare Other

## 2020-11-20 DIAGNOSIS — F0281 Dementia in other diseases classified elsewhere with behavioral disturbance: Secondary | ICD-10-CM | POA: Diagnosis not present

## 2020-11-20 DIAGNOSIS — S73004A Unspecified dislocation of right hip, initial encounter: Secondary | ICD-10-CM

## 2020-11-20 DIAGNOSIS — S72101A Unspecified trochanteric fracture of right femur, initial encounter for closed fracture: Secondary | ICD-10-CM | POA: Diagnosis not present

## 2020-11-20 DIAGNOSIS — G309 Alzheimer's disease, unspecified: Secondary | ICD-10-CM | POA: Diagnosis not present

## 2020-11-20 HISTORY — PX: HIP CLOSED REDUCTION: SHX983

## 2020-11-20 LAB — RESP PANEL BY RT-PCR (FLU A&B, COVID) ARPGX2
Influenza A by PCR: NEGATIVE
Influenza B by PCR: NEGATIVE
SARS Coronavirus 2 by RT PCR: NEGATIVE

## 2020-11-20 SURGERY — CLOSED REDUCTION, HIP
Anesthesia: General | Site: Hip | Laterality: Right

## 2020-11-20 MED ORDER — METOCLOPRAMIDE HCL 5 MG/ML IJ SOLN: 5.0000 mg | Freq: Three times a day (TID) | INTRAMUSCULAR | Status: DC | PRN

## 2020-11-20 MED ORDER — ONDANSETRON HCL 4 MG/2ML IJ SOLN: 4.0000 mg | Freq: Four times a day (QID) | INTRAMUSCULAR | Status: DC | PRN

## 2020-11-20 MED ORDER — CHLORHEXIDINE GLUCONATE 4 % EX LIQD: 60.0000 mL | Freq: Once | CUTANEOUS | Status: DC

## 2020-11-20 MED ORDER — POVIDONE-IODINE 10 % EX SWAB: 2.0000 "application " | Freq: Once | CUTANEOUS | Status: DC

## 2020-11-20 MED ORDER — LACTATED RINGERS IV SOLN: INTRAVENOUS | Status: DC

## 2020-11-20 MED ORDER — FENTANYL CITRATE (PF) 100 MCG/2ML IJ SOLN
INTRAMUSCULAR | Status: DC | PRN
  Administered 2020-11-20: 50 ug via INTRAVENOUS

## 2020-11-20 MED ORDER — PROPOFOL 10 MG/ML IV BOLUS
INTRAVENOUS | Status: DC | PRN
  Administered 2020-11-20: 100 mg via INTRAVENOUS

## 2020-11-20 MED ORDER — MENTHOL 3 MG MT LOZG: 1.0000 | LOZENGE | OROMUCOSAL | Status: DC | PRN

## 2020-11-20 MED ORDER — PROPOFOL 10 MG/ML IV BOLUS
INTRAVENOUS | Status: AC
  Filled 2020-11-20: qty 20

## 2020-11-20 MED ORDER — POLYETHYLENE GLYCOL 3350 17 G PO PACK: 17.0000 g | PACK | Freq: Every day | ORAL | Status: DC | PRN

## 2020-11-20 MED ORDER — LIDOCAINE 2% (20 MG/ML) 5 ML SYRINGE
INTRAMUSCULAR | Status: DC | PRN
  Administered 2020-11-20: 40 mg via INTRAVENOUS

## 2020-11-20 MED ORDER — METOCLOPRAMIDE HCL 5 MG PO TABS: 5.0000 mg | ORAL_TABLET | Freq: Three times a day (TID) | ORAL | Status: DC | PRN

## 2020-11-20 MED ORDER — METHOCARBAMOL 1000 MG/10ML IJ SOLN
500.0000 mg | Freq: Four times a day (QID) | INTRAVENOUS | Status: DC | PRN
  Filled 2020-11-20: qty 5

## 2020-11-20 MED ORDER — BISACODYL 10 MG RE SUPP: 10.0000 mg | Freq: Every day | RECTAL | Status: DC | PRN

## 2020-11-20 MED ORDER — FENTANYL CITRATE (PF) 100 MCG/2ML IJ SOLN: 25.0000 ug | INTRAMUSCULAR | Status: DC | PRN

## 2020-11-20 MED ORDER — OXYCODONE HCL 5 MG/5ML PO SOLN: 5.0000 mg | Freq: Once | ORAL | Status: DC | PRN

## 2020-11-20 MED ORDER — SODIUM CHLORIDE 0.9 % IV SOLN: INTRAVENOUS | Status: DC

## 2020-11-20 MED ORDER — DIPHENHYDRAMINE HCL 12.5 MG/5ML PO ELIX: 12.5000 mg | ORAL_SOLUTION | ORAL | Status: DC | PRN

## 2020-11-20 MED ORDER — TRAMADOL HCL 50 MG PO TABS
50.0000 mg | ORAL_TABLET | Freq: Four times a day (QID) | ORAL | Status: DC | PRN
Start: 2020-11-20 — End: 2020-11-21

## 2020-11-20 MED ORDER — OXYCODONE HCL 5 MG PO TABS: 5.0000 mg | ORAL_TABLET | Freq: Once | ORAL | Status: DC | PRN

## 2020-11-20 MED ORDER — FENTANYL CITRATE (PF) 100 MCG/2ML IJ SOLN
INTRAMUSCULAR | Status: AC
  Filled 2020-11-20: qty 2

## 2020-11-20 MED ORDER — CHLORHEXIDINE GLUCONATE 0.12 % MT SOLN
15.0000 mL | OROMUCOSAL | Status: AC
  Administered 2020-11-20: 15 mL via OROMUCOSAL

## 2020-11-20 MED ORDER — METHOCARBAMOL 500 MG PO TABS: 500.0000 mg | ORAL_TABLET | Freq: Four times a day (QID) | ORAL | Status: DC | PRN

## 2020-11-20 MED ORDER — PHENOL 1.4 % MT LIQD
1.0000 | OROMUCOSAL | Status: DC | PRN
Start: 2020-11-20 — End: 2020-11-21

## 2020-11-20 SURGICAL SUPPLY — 1 items: IMMOBILIZER KNEE 16 UNIV (MISCELLANEOUS) ×1 IMPLANT

## 2020-11-20 NOTE — H&P (View-Only) (Signed)
Patient ID: Suzanne Hall, female   DOB: 06/25/1933, 87 y.o.   MRN: [id number] Subjective: Found to have her RLE internally rotated today This represents a change Unknown event leading to this    Patient reports pain as moderate with movement  Objective:   VITALS:   Vitals:   11/20/20 0558 11/20/20 1612  BP: (!) 102/49 (!) 149/62  Pulse: 79 89  Resp: 16 16  Temp: 98 F (36.7 C) 99.1 F (37.3 C)  SpO2: 97% 98%    Neurovascular intact  RLE internally rotated Pain with movement  Imaging: EXAM: PELVIS - 1-2 VIEW  COMPARISON:  11/16/2020  FINDINGS: Osseous demineralization.  RIGHT hip prosthesis.  Fracture of RIGHT greater trochanter with slightly increased displacement since prior study.  New superolateral dislocation of the RIGHT hip prosthesis from the acetabulum.  LEFT hip joint and SI joints preserved.  No additional fracture identified.  IMPRESSION: Superolateral dislocation of RIGHT hip prosthesis.  Slightly increased displacement of previously identified RIGHT greater trochanteric fracture.  Osseous demineralization.  Findings conveyed to Dr. Goodrich on 11/20/2020 at 1112 hours.  LABS No results for input(s): HGB, HCT, WBC, PLT in the last 72 hours.  No results for input(s): NA, K, BUN, CREATININE, GLUCOSE in the last 72 hours.  No results for input(s): LABPT, INR in the last 72 hours.   Assessment/Plan: 1. History of right hip hemiarthroplasty for femoral neck fracture 2. Recent fall with minimally displaced greater trochanter fracture now with newly diagnosed dislocation   Plan: We will take her to the OR this evening to reduce hip Post procedure she will be in a knee immobilizer to try and prevent excessive hip flexion NPO for procedure Findings and plans reviewed with her son, POA 

## 2020-11-20 NOTE — Progress Notes (Signed)
Patient ID: Suzanne Hall, female   DOB: [birth year], 87 y.o.   MRN: [id number] Subjective: Found to have her RLE internally rotated today This represents a change Unknown event leading to this    Patient reports pain as moderate with movement  Objective:   VITALS:   Vitals:   11/20/20 0558 11/20/20 1612  BP: (!) 102/49 (!) 149/62  Pulse: 79 89  Resp: 16 16  Temp: 98 F (36.7 C) 99.1 F (37.3 C)  SpO2: 97% 98%    Neurovascular intact  RLE internally rotated Pain with movement  Imaging: EXAM: PELVIS - 1-2 VIEW  COMPARISON:  11/16/2020  FINDINGS: Osseous demineralization.  RIGHT hip prosthesis.  Fracture of RIGHT greater trochanter with slightly increased displacement since prior study.  New superolateral dislocation of the RIGHT hip prosthesis from the acetabulum.  LEFT hip joint and SI joints preserved.  No additional fracture identified.  IMPRESSION: Superolateral dislocation of RIGHT hip prosthesis.  Slightly increased displacement of previously identified RIGHT greater trochanteric fracture.  Osseous demineralization.  Findings conveyed to Dr. Irene Limbo on 11/20/2020 at 1112 hours.  LABS No results for input(s): HGB, HCT, WBC, PLT in the last 72 hours.  No results for input(s): NA, K, BUN, CREATININE, GLUCOSE in the last 72 hours.  No results for input(s): LABPT, INR in the last 72 hours.   Assessment/Plan: 1. History of right hip hemiarthroplasty for femoral neck fracture 2. Recent fall with minimally displaced greater trochanter fracture now with newly diagnosed dislocation   Plan: We will take her to the OR this evening to reduce hip Post procedure she will be in a knee immobilizer to try and prevent excessive hip flexion NPO for procedure Findings and plans reviewed with her son, POA

## 2020-11-20 NOTE — Brief Op Note (Signed)
11/20/2020  5:44 PM  PATIENT:  Leonette Most  87 y.o. female  PRE-OPERATIVE DIAGNOSIS:  DISLOCATION RIGHT HIP hemiarthroplasty  POST-OPERATIVE DIAGNOSIS:  DISLOCATION RIGHT HIP hemiarthroplasty  PROCEDURE:  Procedure(s): CLOSED REDUCTION HIP (Right)  SURGEON:  Surgeon(s) and Role:    * Durene Romans, MD - Primary  PHYSICIAN ASSISTANT: None  ANESTHESIA:   IV sedation  EBL:  None  BLOOD ADMINISTERED:none  DRAINS: none   LOCAL MEDICATIONS USED:  NONE  SPECIMEN:  No Specimen  DISPOSITION OF SPECIMEN:  N/A  COUNTS:  YES  TOURNIQUET:  * No tourniquets in log *  DICTATION: .Other Dictation: Dictation Number [id number]  PLAN OF CARE: Admit to inpatient   PATIENT DISPOSITION:  PACU - hemodynamically stable.   Delay start of Pharmacological VTE agent (>24hrs) due to surgical blood loss or risk of bleeding: not applicable

## 2020-11-20 NOTE — Op Note (Signed)
Suzanne Hall, STREIGHT MEDICAL RECORD NO: [id number] ACCOUNT NO: [id number] DATE OF BIRTH: February 15, 1933 FACILITY: [facility]: [id number] PHYSICIAN: Madlyn Frankel. Charlann Boxer, MD  Operative Report   DATE OF PROCEDURE: 11/20/2020  PREOPERATIVE DIAGNOSIS:  Dislocated right hip hemiarthroplasty.  POSTOPERATIVE DIAGNOSIS:  Dislocated right hip hemiarthroplasty.  PROCEDURE:  Closed reduction of right hip hemiarthroplasty under IV sedation.  SURGEON:  Durene Romans, MD  ASSISTANT:  Surgical team.  ANESTHESIA:  IV sedation.  COMPLICATIONS:  None apparent.  INDICATIONS:  The patient is an 87 year old female, pleasant, with a history of dementia.  She had a previous history of a right femoral neck fracture, treated with a right hip hemiarthroplasty.  She presented to the hospital about a week ago now after a  witnessed fall, landing on her right hip.  Radiographs revealed a minimally displaced greater trochanteric femur fracture with stable prosthesis.  This was elected to be treated nonoperatively.  During her time in the hospital, she went from having a  stable and normal neutrally reduced right hip to findings of an internally rotated lower extremity with dislocation by plain films identified.  The necessity of reduction was discussed with her son, her power of attorney.  Consent was obtained for above.  DESCRIPTION OF PROCEDURE:  The patient was brought to the operative theater.  Once adequate anesthesia, preoperative antibiotics not administered due to the fact this was a closed procedure, a timeout was performed identifying the patient, the planned  procedure, and extremity.  I had downward pressure applied on her pelvis as I applied traction with the hip flexed and internally rotated initially and then externally rotated into a neutral position.  We were able to restore her leg lengths to normal  with a neutral position.  Note that following the procedure, we spent a lot of time, cleaning up her  genitourinary area as well as her perianal area due to the fact that she had a lot of stool in this area.  We changed her sheets and cleaned up and tidied the area significantly.   We then placed her into a right knee immobilizer.  She was then transferred to the recovery room in stable condition, tolerated the procedure well.  Postoperatively, I will allow her to be weightbearing as tolerated.  She will use a knee immobilizer to prevent hyperflexion and any potential internal rotation that could lead to early instability given the fact that this has just occurred.  Otherwise,  we will see her back in the office as regularly scheduled.   SHW D: 11/20/2020 5:48:35 pm T: 11/20/2020 9:56:00 pm  JOB: [id number]/ [id number]

## 2020-11-20 NOTE — Interval H&P Note (Signed)
History and Physical Interval Note:  11/20/2020 5:23 PM  Suzanne Hall  has presented today for surgery, with the diagnosis of DISLOCATION RIGHT HIP.  The various methods of treatment have been discussed with the patient and family. After consideration of risks, benefits and other options for treatment, the patient has consented to  Procedure(s): CLOSED REDUCTION HIP (Right) as a surgical intervention.  The patient's history has been reviewed, patient examined, no change in status, stable for surgery.  I have reviewed the patient's chart and labs.  Questions were answered to the patient's satisfaction.     [name]

## 2020-11-20 NOTE — Anesthesia Preprocedure Evaluation (Signed)
Anesthesia Evaluation  Patient identified by MRN, date of birth, ID band Patient awake    Reviewed: Allergy & Precautions, H&P , NPO status , Patient's Chart, lab work & pertinent test results  Airway Mallampati: II   Neck ROM: full    Dental   Pulmonary neg pulmonary ROS,    breath sounds clear to auscultation       Cardiovascular negative cardio ROS   Rhythm:regular Rate:Normal     Neuro/Psych PSYCHIATRIC DISORDERS Dementia    GI/Hepatic   Endo/Other    Renal/GU Renal InsufficiencyRenal disease     Musculoskeletal   Abdominal   Peds  Hematology   Anesthesia Other Findings   Reproductive/Obstetrics                            Anesthesia Physical Anesthesia Plan  ASA: III  Anesthesia Plan: General   Post-op Pain Management:    Induction: Intravenous  PONV Risk Score and Plan: 3 and Propofol infusion and Treatment may vary due to age or medical condition  Airway Management Planned: Mask  Additional Equipment:   Intra-op Plan:   Post-operative Plan:   Informed Consent: I have reviewed the patients History and Physical, chart, labs and discussed the procedure including the risks, benefits and alternatives for the proposed anesthesia with the patient or authorized representative who has indicated his/her understanding and acceptance.     Dental advisory given  Plan Discussed with: CRNA, Anesthesiologist and Surgeon  Anesthesia Plan Comments:         Anesthesia Quick Evaluation

## 2020-11-20 NOTE — Plan of Care (Signed)
Plan of care reviewed. 

## 2020-11-20 NOTE — Anesthesia Postprocedure Evaluation (Signed)
Anesthesia Post Note  Patient: Suzanne Hall  Procedure(s) Performed: CLOSED REDUCTION HIP (Right Hip)     Patient location during evaluation: PACU Anesthesia Type: General Level of consciousness: awake and alert Pain management: pain level controlled Vital Signs Assessment: post-procedure vital signs reviewed and stable Respiratory status: spontaneous breathing, nonlabored ventilation, respiratory function stable and patient connected to nasal cannula oxygen Cardiovascular status: blood pressure returned to baseline and stable Postop Assessment: no apparent nausea or vomiting Anesthetic complications: no   No complications documented.  Last Vitals:  Vitals:   11/20/20 1800 11/20/20 1815  BP: (!) 156/72 (!) 144/75  Pulse: 85 85  Resp: 19 14  Temp:    SpO2: 100% 100%    Last Pain:  Vitals:   11/20/20 1815  TempSrc:   PainSc: 0-No pain                 Diala Waxman S

## 2020-11-20 NOTE — TOC Progression Note (Signed)
Transition of Care [facility]) - Progression Note    Patient Details  Name: Suzanne Hall MRN: [id number] Date of Birth: [birth year]  Transition of Care [facility]) CM/SW Contact  Amada Jupiter, LCSW Phone Number: 11/20/2020, 2:12 PM  Clinical Narrative:    Spoke with pt's son, Annette Stable, this morning to review SNF bed offers - he has accepted offer at [facility].  Following this discussion, results came from CT pelvis and pt's dc placed on hold today awaiting input from ortho MD.  Have alerted Magda Paganini at [facility] who will reconsider if can still offer bed once pt is cleared for dc.   Expected Discharge Plan: Skilled Nursing Facility Barriers to Discharge: Continued Medical Work up,SNF Pending bed offer  Expected Discharge Plan and Services Expected Discharge Plan: Skilled Nursing Facility In-house Referral: Clinical Social Work   Post Acute Care Choice: Skilled Nursing Facility Living arrangements for the past 2 months: Assisted Living Facility ([facility])                 DME Arranged: N/A DME Agency: NA                   Social Determinants of Health (SDOH) Interventions    Readmission Risk Interventions No flowsheet data found.

## 2020-11-20 NOTE — Transfer of Care (Signed)
Immediate Anesthesia Transfer of Care Note  Patient: Suzanne Hall  Procedure(s) Performed: CLOSED REDUCTION HIP (Right Hip)  Patient Location: PACU  Anesthesia Type:General  Level of Consciousness: sedated, patient cooperative and responds to stimulation  Airway & Oxygen Therapy: Patient Spontanous Breathing and Patient connected to face mask oxygen  Post-op Assessment: Report given to RN and Post -op Vital signs reviewed and stable  Post vital signs: Reviewed and stable  Last Vitals:  Vitals Value Taken Time  BP 151/70 11/20/20 1749  Temp    Pulse 80 11/20/20 1751  Resp 12 11/20/20 1751  SpO2 100 % 11/20/20 1751  Vitals shown include unvalidated device data.  Last Pain:  Vitals:   11/20/20 1612  TempSrc: Oral  PainSc:       Patients Stated Pain Goal: 2 (11/17/20 0030)  Complications: No complications documented.

## 2020-11-20 NOTE — Progress Notes (Addendum)
PROGRESS NOTE  Suzanne Hall GEX:[id number] DOB: 21-Jan-1933 DOA: 11/16/2020 PCP: Patient, No Pcp Per (Inactive)  Brief History    87 year old woman PMH advanced dementia, chronic hyponatremia presented from [facility] status post fall witnessed by staff.  No loss of consciousness or head trauma.  Complains of right hip and thigh pain.  Found to have a right greater trochanteric fracture.  Nonoperative management recommended, pain control, ambulate as tolerated.  However was unable to ambulate and so was admitted for pain control.  Seen by orthopedics with recommendation for nonoperative management.  Plans were for discharge 5/26, however she was found to have gross deformity of the right leg.  Imaging confirmed right hip dislocation.  Plans are for the OR this evening to reduce the hip.  A & P  Closed trochanteric fracture right status post mechanical fall, same hip status post arthroplasty December 2021 --Nonoperative management per orthopedics.  Pain control but will limit narcotics if possible.   -- Obvious gross deformity today with new right hip dislocation.  Plan for operative reduction as per Dr. Charlann Boxer.  I discussed in detail with Dr. Charlann Boxer this morning and formulated the diagnostic plan.  Right hip dislocation prosthetic --Management per orthopedics.  Chronic hyponatremia etiology unclear -- Stable. 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  Add bouts of severe agitation last hospitalization requiring restraints. --Delirium precautions     Nutritional Assessment: Body mass index is 18.8 kg/m.Marland Kitchen Seen by dietician.  I agree with the assessment and plan as outlined below: Nutrition Status: Nutrition Problem: Severe Malnutrition Etiology: chronic illness Signs/Symptoms: severe fat depletion,severe muscle depletion Interventions: Ensure Enlive (each supplement provides  350kcal and 20 grams of protein)  Disposition Plan:  Discussion: Plan for SNF  Status is: Inpatient  Remains inpatient appropriate because:Inpatient level of care appropriate due to severity of illness  Dispo: The patient is from: ALF              Anticipated d/c is to: SNF              Patient currently is medically stable to d/c.   Difficult to place patient No  DVT prophylaxis: SCDs Start: 11/16/20 2220   Code Status: DNR Level of care: Med-Surg Family Communication: son by telephone  Brendia Sacks, MD  [facility] Direct contact: see [email or web address] (further directions at bottom of note if needed) 7PM-7AM contact night coverage as at bottom of note 11/20/2020, 4:38 PM  LOS: 4 days   Significant Hospital Events   .    Consults:  . Orthopedics    Procedures:  .   Significant Diagnostic Tests:  Marland Kitchen    Micro Data:  .    Antimicrobials:  .   Interval History/Subjective  CC: f/u.  Periprosthetic right hip fracture  Has severe pain in right hip, can't move it.  Objective   Vitals:  Vitals:   11/20/20 0558 11/20/20 1612  BP: (!) 102/49 (!) 149/62  Pulse: 79 89  Resp: 16 16  Temp: 98 F (36.7 C) 99.1 F (37.3 C)  SpO2: 97% 98%    Exam:  Constitutional:   . Appears calm but uncomfortable, obviously in pain ENMT:  . grossly normal hearing  Respiratory:  . CTA bilaterally, no w/r/r.  . Respiratory effort normal. Cardiovascular:  . RRR, no m/r/g . No LE extremity edema   Musculoskeletal:  . RLE obviously deformed, hip externally rotated, knee internally rotated  Psychiatric:  . Mental status o Mood, affect appropriate  I have personally reviewed the following:   Today's Data  . No new data  Scheduled Meds: . [MAR Hold] acetaminophen  650 mg Oral TID  . chlorhexidine  60 mL Topical Once  . [MAR Hold] docusate sodium  200 mg Oral Daily  . [MAR Hold] feeding supplement  237 mL Oral BID BM  . [MAR Hold] lidocaine  1 patch Transdermal Q24H   . [MAR Hold] polyethylene glycol  17 g Oral Daily  . povidone-iodine  2 application Topical Once  . [MAR Hold] senna-docusate  1 tablet Oral BID  . [MAR Hold] Vitamin D (Ergocalciferol)  50,000 Units Oral Q7 days   Continuous Infusions: . lactated ringers 10 mL/hr at 11/20/20 1615    Principal Problem:   Traumatic closed trochanteric fracture of femur with minimal displacement, right, initial encounter (HCC) Active Problems:   Hyponatremia   Dementia with behavioral disturbance (HCC)   Goals of care, counseling/discussion   Closed dislocation of right hip (HCC)   LOS: 4 days   How to contact the [facility] Attending or Consulting provider 7A - 7P or covering provider during after hours 7P -7A, for this patient?  1. Check the care team in [facility] and look for a) attending/consulting TRH provider listed and b) the [facility] team listed 2. Log into [email or web address] and use [place]'s universal password to access. If you do not have the password, please contact the hospital operator. 3. Locate the [facility] provider you are looking for under [facility] and page to a number that you can be directly reached. 4. If you still have difficulty reaching the provider, please page the [facility] (Director on Call) for the Hospitalists listed on amion for assistance.

## 2020-11-20 NOTE — Progress Notes (Signed)
PT Cancellation Note  Patient Details Name: Suzanne Hall MRN: [id number] DOB: October 04, 1932   Cancelled Treatment:     pt back in OR for hip dislocation.  Pt has been evaluated with rec for SNF before returning to [facility] which is an ALF.   Felecia Shelling  PTA Acute  Rehabilitation Services Pager      [phone] Office      [phone]

## 2020-11-21 ENCOUNTER — Inpatient Hospital Stay (HOSPITAL_COMMUNITY): Payer: Medicare Other

## 2020-11-21 ENCOUNTER — Encounter (HOSPITAL_COMMUNITY): Payer: Self-pay | Admitting: Orthopedic Surgery

## 2020-11-21 DIAGNOSIS — G309 Alzheimer's disease, unspecified: Secondary | ICD-10-CM | POA: Diagnosis not present

## 2020-11-21 DIAGNOSIS — S73004A Unspecified dislocation of right hip, initial encounter: Secondary | ICD-10-CM | POA: Diagnosis not present

## 2020-11-21 DIAGNOSIS — S72101A Unspecified trochanteric fracture of right femur, initial encounter for closed fracture: Secondary | ICD-10-CM | POA: Diagnosis not present

## 2020-11-21 DIAGNOSIS — E871 Hypo-osmolality and hyponatremia: Secondary | ICD-10-CM | POA: Diagnosis not present

## 2020-11-21 LAB — SURGICAL PCR SCREEN
MRSA, PCR: NEGATIVE
Staphylococcus aureus: NEGATIVE

## 2020-11-21 MED ORDER — TRAMADOL HCL 50 MG PO TABS: 50.0000 mg | ORAL_TABLET | Freq: Two times a day (BID) | ORAL | 0 refills | Status: AC | PRN

## 2020-11-21 MED ORDER — LIDOCAINE 5 % EX PTCH: 1.0000 | MEDICATED_PATCH | CUTANEOUS | Status: AC

## 2020-11-21 MED ORDER — VITAMIN D (ERGOCALCIFEROL) 1.25 MG (50000 UNIT) PO CAPS: 50000.0000 [IU] | ORAL_CAPSULE | ORAL | Status: AC

## 2020-11-21 MED ORDER — COVID-19 MRNA VACC (MODERNA) 50 MCG/0.25ML IM SUSP
0.2500 mL | Freq: Once | INTRAMUSCULAR | Status: AC
  Administered 2020-11-21: 0.25 mL via INTRAMUSCULAR
  Filled 2020-11-21: qty 0.25

## 2020-11-21 NOTE — Care Management Important Message (Signed)
Important Message  Patient Details IM Letter given to the Patient. Name: Suzanne Hall MRN: [id number] Date of Birth: 11-17-1932   Medicare Important Message Given:  Yes     Caren Macadam 11/21/2020, 3:16 PM

## 2020-11-21 NOTE — TOC Transition Note (Signed)
Transition of Care [facility]) - CM/SW Discharge Note   Patient Details  Name: Suzanne Hall MRN: [id number] Date of Birth: 08-10-1932  Transition of Care [facility]) CM/SW Contact:  Amada Jupiter, LCSW Phone Number: 11/21/2020, 2:51 PM   Clinical Narrative:    Pt medically cleared for dc today to [facility].  Per pt and son agreement, pt was administered a COVID booster prior to dc.  PTAR call and RN to call report to [phone] ext. [phone].  No further TOC needs.   Final next level of care: Skilled Nursing Facility Barriers to Discharge: Barriers Resolved   Patient Goals and CMS Choice Patient states their goals for this hospitalization and ongoing recovery are:: Go to SNF and then transition back to [facility] CMS Medicare.gov Compare Post Acute Care list provided to:: Patient Represenative (must comment) Choice offered to / list presented to : Adult Children  Discharge Placement              Patient chooses bed at: [facility] [place] [facility] Patient to be transferred to facility by: PTAR Name of family member notified: son,Bill Patient and family notified of of transfer: 11/21/20  Discharge Plan and Services In-house Referral: Clinical Social Work   Post Acute Care Choice: Skilled Nursing Facility          DME Arranged: N/A DME Agency: NA                  Social Determinants of Health (SDOH) Interventions     Readmission Risk Interventions No flowsheet data found.

## 2020-11-21 NOTE — Discharge Summary (Addendum)
Physician Discharge Summary  Suzanne Hall:[id number] DOB: 14-Dec-1932 DOA: 11/16/2020  PCP: Patient, No Pcp Per (Inactive)  Admit date: 11/16/2020 Discharge date: 11/21/2020  Recommendations for Outpatient Follow-up:  1. Follow-up right hip fracture, right hip dislocation 2. Chronic hyponatremia etiology unclear. Consider fluid restriction if needed  NOTE: Facility requested COVID booster prior to admission. Pt has had Moderna x2. I discussed with son Suzanne Hall who consented.  Moderna booster administrated 5/27   Contact information for follow-up providers    Durene Romans, MD Follow up in 4 week(s).   Specialty: Orthopedic Surgery Why: X-rays Contact information: [address] [place] [address] [phone]        Durene Romans, MD. Schedule an appointment as soon as possible for a visit in 2 weeks.   Specialty: Orthopedic Surgery Contact information: [address] [place] [address] [phone]            Contact information for after-discharge care    Destination    [facility] .   Service: Skilled Nursing Contact information: [address] [place] [address] [phone]                   Discharge Diagnoses: Principal diagnosis is #1 Principal Problem:   Traumatic closed trochanteric fracture of femur with minimal displacement, right, initial encounter [facility]) Active Problems:   Hyponatremia   Dementia with behavioral disturbance (HCC)   Goals of care, counseling/discussion   Closed dislocation of right hip [facility])   Discharge Condition: improved Disposition: SNF  Diet recommendation:  Diet Orders (From admission, onward)    Start     Ordered   11/20/20 1918  Diet regular Room service appropriate? Yes; Fluid consistency: Thin  Diet effective now       Question Answer Comment  Room service appropriate? Yes   Fluid consistency: Thin      11/20/20 1917            HPI/Hospital Course:   87 year old woman PMH advanced dementia, chronic hyponatremia presented from [facility] care status post fall witnessed by staff.  No loss of consciousness or head trauma.  Complaint of right hip and thigh pain.  Found to have a right greater trochanteric fracture.  Nonoperative management recommended, pain control, ambulate as tolerated.  However was unable to ambulate and so was admitted for pain control.  Seen by orthopedics with recommendation for nonoperative management.  Plans were for discharge 5/26 to SNF, however she was found to have gross deformity of the right leg.  Imaging confirmed right hip dislocation.  Status post reduction of the hip in the operating room.  Appears well 5/27, cleared for discharge by orthopedics.  A & P  Closed trochanteric fracture right status post mechanical fall, same hip status post arthroplasty December 2021 --Nonoperative management per orthopedics.  Pain control but will limit narcotics if possible.   --Due to memory issues she can be weight bearing as tolerated with walker at all times for transfers and limited walking --Follow up with orthopedics.  Right hip dislocation prosthetic -- Status post closed reduction in the operating room 5/26  Chronic hyponatremia etiology unclear -- Stable. 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   --Delirium precautions.  Appears stable.  No behavioral disturbance here.  No restraints needed.  Calm.     Consults:   Orthopedics    Procedures:  Closed reduction of right hip hemiarthroplasty under IV sedation.  Today's assessment: S: CC: f/u hip fx  Feels fine today, little pain  O: Vitals:  Vitals:   11/21/20 0159 11/21/20 0537  BP: (!) 115/54 (!) 128/53  Pulse: 81 76  Resp: 16 16  Temp: 97.9 F (36.6 C) 98.5 F (36.9 C)  SpO2: 96% 97%    Constitutional:   . Appears calm and comfortable ENMT:  . grossly normal hearing  Respiratory:  . CTA bilaterally, no w/r/r.  . Respiratory effort normal. Cardiovascular:  . RRR, no m/r/g . No LE extremity edema   Musculoskeletal:  . RLE, LLE   . Moves both legs to command . Legs appear symmetric Psychiatric:  . Mental status o Mood, affect appropriate  Right hip film: no dislocation. Fx present.  Discharge Instructions  Discharge Instructions    Discharge instructions   Complete by: As directed    Call your physician or seek immediate medical attention for pain, swelling, inability to walk, deformity of leg or worsening of condition.     Allergies as of 11/21/2020   No Known Allergies     Medication List    TAKE these medications   acetaminophen 500 MG tablet Commonly known as: TYLENOL Take 500 mg by mouth every 6 (six) hours as needed for moderate pain.   ferrous sulfate 325 (65 FE) MG tablet Take 1 tablet (325 mg total) by mouth 3 (three) times daily after meals.   lidocaine 5 % Commonly known as: LIDODERM Place 1 patch onto the skin daily. Remove & Discard patch within 12 hours or as directed by MD   polyethylene glycol 17 g packet Commonly known as: MIRALAX / GLYCOLAX Take 17 g by mouth daily as needed. What changed: reasons to take this   senna-docusate 8.6-50 MG tablet Commonly known as: Senokot-S Take 1 tablet by mouth 2 (two) times daily. What changed:   when to take this  reasons to take this   traMADol 50 MG tablet Commonly known as: ULTRAM Take 1 tablet (50 mg total) by mouth every 12 (twelve) hours as needed for moderate pain or severe pain.   Vitamin D (Ergocalciferol) 1.25 MG (50000 UNIT) Caps capsule Commonly known as: DRISDOL Take 1 capsule (50,000 Units total) by mouth every 7 (seven) days.      No Known Allergies  The results of significant diagnostics from this hospitalization (including imaging, microbiology, ancillary and laboratory) are  listed below for reference.    Significant Diagnostic Studies: DG Chest 1 View  Result Date: 11/16/2020 CLINICAL DATA:  Fall. EXAM: CHEST  1 VIEW COMPARISON:  06/25/2020 FINDINGS: Borderline cardiomegaly, similar to prior. Unchanged mediastinal contours. No acute airspace disease, pulmonary edema, pleural effusion, or pneumothorax. No acute osseous abnormalities are seen. Scoliotic curvature of the spine. IMPRESSION: No acute findings. Borderline cardiomegaly. Electronically Signed   By: Narda Rutherford M.D.   On: 11/16/2020 16:58   DG Pelvis 1-2 Views  Result Date: 11/20/2020 CLINICAL DATA:  Pain, known fracture on 11/16/2020 EXAM: PELVIS - 1-2 VIEW COMPARISON:  11/16/2020 FINDINGS: Osseous demineralization. RIGHT hip prosthesis. Fracture of RIGHT greater trochanter with slightly increased displacement since prior study. New superolateral dislocation of the RIGHT hip prosthesis from the acetabulum. LEFT hip joint and SI joints preserved. No additional fracture identified. IMPRESSION: Superolateral dislocation of RIGHT hip prosthesis. Slightly increased displacement of previously identified RIGHT greater trochanteric fracture. Osseous demineralization. Findings conveyed to Dr. Irene Limbo on 11/20/2020 at 1112 hours. Electronically Signed  By: Ulyses SouthwardMark  Boles M.D.   On: 11/20/2020 11:19   DG Pelvis Portable  Result Date: 11/21/2020 CLINICAL DATA:  Right hip reduction. EXAM: PORTABLE PELVIS 1-2 VIEWS COMPARISON:  11/20/2020 FINDINGS: Right hip arthroplasty appears to be located on these views. Again noted is a periprosthetic fracture involving the right great trochanter. Left hip appears to be located. IMPRESSION: 1. Right hip arthroplasty has been successfully reduced. 2. Displaced fracture of the right greater trochanter. Electronically Signed   By: Richarda OverlieAdam  Henn M.D.   On: 11/21/2020 09:01   DG Hip Unilat W or Wo Pelvis 2-3 Views Right  Result Date: 11/16/2020 CLINICAL DATA:  Fall today.  Right hip pain.  EXAM: DG HIP (WITH OR WITHOUT PELVIS) 2-3V RIGHT COMPARISON:  Hip radiograph 06/25/2020 FINDINGS: Right hip arthroplasty in expected alignment. There is a mildly displaced fracture involving the right greater trochanter extending to the intertrochanteric portion of femoral component. No additional fracture. Pubic rami are intact. Bones diffusely under mineralized. Pubic symphysis and sacroiliac joints are congruent. IMPRESSION: Right hip arthroplasty. There is a mildly displaced right greater trochanteric fracture extending to the intertrochanteric portion of femoral component. Electronically Signed   By: Narda RutherfordMelanie  Sanford M.D.   On: 11/16/2020 16:57    Microbiology: Recent Results (from the past 240 hour(s))  Resp Panel by RT-PCR (Flu A&B, Covid) Nasopharyngeal Swab     Status: None   Collection Time: 11/16/20  8:02 PM   Specimen: Nasopharyngeal Swab; Nasopharyngeal(NP) swabs in vial transport medium  Result Value Ref Range Status   SARS Coronavirus 2 by RT PCR NEGATIVE NEGATIVE Final    Comment: (NOTE) SARS-CoV-2 target nucleic acids are NOT DETECTED.  The SARS-CoV-2 RNA is generally detectable in upper respiratory specimens during the acute phase of infection. The lowest concentration of SARS-CoV-2 viral copies this assay can detect is 138 copies/mL. A negative result does not preclude SARS-Cov-2 infection and should not be used as the sole basis for treatment or other patient management decisions. A negative result may occur with  improper specimen collection/handling, submission of specimen other than nasopharyngeal swab, presence of viral mutation(s) within the areas targeted by this assay, and inadequate number of viral copies(<138 copies/mL). A negative result must be combined with clinical observations, patient history, and epidemiological information. The expected result is Negative.  Fact Sheet for Patients:  BloggerCourse.comhttps://www.fda.gov/media/152166/download  Fact Sheet for Healthcare  Providers:  SeriousBroker.ithttps://www.fda.gov/media/152162/download  This test is no t yet approved or cleared by the [place] FDA and  has been authorized for detection and/or diagnosis of SARS-CoV-2 by FDA under an Emergency Use Authorization (EUA). This EUA will remain  in effect (meaning this test can be used) for the duration of the COVID-19 declaration under Section 564(b)(1) of the Act, 21 U.S.C.section 360bbb-3(b)(1), unless the authorization is terminated  or revoked sooner.       Influenza A by PCR NEGATIVE NEGATIVE Final   Influenza B by PCR NEGATIVE NEGATIVE Final    Comment: (NOTE) The Xpert Xpress SARS-CoV-2/FLU/RSV plus assay is intended as an aid in the diagnosis of influenza from Nasopharyngeal swab specimens and should not be used as a sole basis for treatment. Nasal washings and aspirates are unacceptable for Xpert Xpress SARS-CoV-2/FLU/RSV testing.  Fact Sheet for Patients: BloggerCourse.comhttps://www.fda.gov/media/152166/download  Fact Sheet for Healthcare Providers: SeriousBroker.ithttps://www.fda.gov/media/152162/download  This test is not yet approved or cleared by the [place] FDA and has been authorized for detection and/or diagnosis of SARS-CoV-2 by FDA under an Emergency Use Authorization (EUA). This EUA will  remain in effect (meaning this test can be used) for the duration of the COVID-19 declaration under Section 564(b)(1) of the Act, 21 U.S.C. section 360bbb-3(b)(1), unless the authorization is terminated or revoked.  Performed at [facility], [address], [place], [place] [address]   Resp Panel by RT-PCR (Flu A&B, Covid) Nasopharyngeal Swab     Status: None   Collection Time: 11/20/20 10:42 AM   Specimen: Nasopharyngeal Swab; Nasopharyngeal(NP) swabs in vial transport medium  Result Value Ref Range Status   SARS Coronavirus 2 by RT PCR NEGATIVE NEGATIVE Final    Comment: (NOTE) SARS-CoV-2 target nucleic acids are NOT DETECTED.  The SARS-CoV-2 RNA is  generally detectable in upper respiratory specimens during the acute phase of infection. The lowest concentration of SARS-CoV-2 viral copies this assay can detect is 138 copies/mL. A negative result does not preclude SARS-Cov-2 infection and should not be used as the sole basis for treatment or other patient management decisions. A negative result may occur with  improper specimen collection/handling, submission of specimen other than nasopharyngeal swab, presence of viral mutation(s) within the areas targeted by this assay, and inadequate number of viral copies(<138 copies/mL). A negative result must be combined with clinical observations, patient history, and epidemiological information. The expected result is Negative.  Fact Sheet for Patients:  BloggerCourse.com  Fact Sheet for Healthcare Providers:  SeriousBroker.it  This test is no t yet approved or cleared by the [place] FDA and  has been authorized for detection and/or diagnosis of SARS-CoV-2 by FDA under an Emergency Use Authorization (EUA). This EUA will remain  in effect (meaning this test can be used) for the duration of the COVID-19 declaration under Section 564(b)(1) of the Act, 21 U.S.C.section 360bbb-3(b)(1), unless the authorization is terminated  or revoked sooner.       Influenza A by PCR NEGATIVE NEGATIVE Final   Influenza B by PCR NEGATIVE NEGATIVE Final    Comment: (NOTE) The Xpert Xpress SARS-CoV-2/FLU/RSV plus assay is intended as an aid in the diagnosis of influenza from Nasopharyngeal swab specimens and should not be used as a sole basis for treatment. Nasal washings and aspirates are unacceptable for Xpert Xpress SARS-CoV-2/FLU/RSV testing.  Fact Sheet for Patients: BloggerCourse.com  Fact Sheet for Healthcare Providers: SeriousBroker.it  This test is not yet approved or cleared by the [place] FDA and has been authorized for detection and/or diagnosis of SARS-CoV-2 by FDA under an Emergency Use Authorization (EUA). This EUA will remain in effect (meaning this test can be used) for the duration of the COVID-19 declaration under Section 564(b)(1) of the Act, 21 U.S.C. section 360bbb-3(b)(1), unless the authorization is terminated or revoked.  Performed at [facility], [address] [place], [place] [address]   Surgical pcr screen     Status: None   Collection Time: 11/20/20  4:20 PM   Specimen: Nasal Mucosa; Nasal Swab  Result Value Ref Range Status   MRSA, PCR NEGATIVE NEGATIVE Final   Staphylococcus aureus NEGATIVE NEGATIVE Final    Comment: (NOTE) The Xpert SA Assay (FDA approved for NASAL specimens in patients 42 years of age and older), is one component of a comprehensive surveillance program. It is not intended to diagnose infection nor to guide or monitor treatment. Performed at [facility], [address] [place], [place] [address]      Labs: Basic Metabolic Panel: Recent Labs  Lab 11/16/20 1616 11/17/20 0236  NA 130* 130*  K 3.9 4.2  CL 98 96*  CO2 24 27  GLUCOSE 108* 105*  BUN 18 14  CREATININE 0.68 0.84  CALCIUM 8.9 8.9   Liver Function Tests: Recent Labs  Lab 11/16/20 1616 11/17/20 0236  AST 20 20  ALT 14 13  ALKPHOS 72 63  BILITOT 0.4 0.4  PROT 6.7 6.7  ALBUMIN 3.7 3.6   CBC: Recent Labs  Lab 11/16/20 1616 11/17/20 0236  WBC 11.9* 11.0*  NEUTROABS 9.9* 8.1*  HGB 12.4 12.7  HCT 38.4 38.4  MCV 93.7 92.5  PLT 242 225    Principal Problem:   Traumatic closed trochanteric fracture of femur with minimal displacement, right, initial encounter (HCC) Active Problems:   Hyponatremia   Dementia with behavioral disturbance (HCC)   Goals of care, counseling/discussion   Closed dislocation of right hip (HCC)   Time coordinating discharge: 35 minutes  Signed:  Brendia Sacks,  MD  [facility]  11/21/2020, 9:55 AM

## 2020-11-21 NOTE — Progress Notes (Signed)
Notified son that patient is to receive Covid Booster shot, and he gives consent for his mother to have it  D Actuary

## 2020-11-21 NOTE — Progress Notes (Signed)
Report called to [facility] [employer]

## 2020-11-21 NOTE — Progress Notes (Signed)
Patient ID: RENNEE Hall, female   DOB: Nov 28, 1932, 87 y.o.   MRN: [id number] Subjective: 1 Day Post-Op Procedure(s) (LRB): CLOSED REDUCTION HIP (Right)    Patient awakens upon entering room.  No events noted. States she is doing well.  Objective:   VITALS:   Vitals:   11/21/20 0159 11/21/20 0537  BP: (!) 115/54 (!) 128/53  Pulse: 81 76  Resp: 16 16  Temp: 97.9 F (36.6 C) 98.5 F (36.9 C)  SpO2: 96% 97%    Neurovascular intact  RLE in neutral position with knee immobilizer in place  LABS No results for input(s): HGB, HCT, WBC, PLT in the last 72 hours.  No results for input(s): NA, K, BUN, CREATININE, GLUCOSE in the last 72 hours.  No results for input(s): LABPT, INR in the last 72 hours.   Assessment/Plan: 1 Day Post-Op Procedure(s) (LRB): CLOSED REDUCTION HIP (Right)   Up with therapy  WBAT, posterior hip pre-cautions with knee immobilizer on D/C to SNF per medicine RTC in 4 weeks for Xray follow up

## 2021-04-18 IMAGING — CT CT ABD-PELV W/O CM
2 of 4 series · 16 of 46 positions shown, 18 images · non-contrast
Comparison: None available

CLINICAL DATA: Acute blood loss anemia

EXAM:
CT ABDOMEN AND PELVIS WITHOUT CONTRAST
TECHNIQUE: Multidetector CT imaging of the abdomen and pelvis was performed
following the standard protocol without IV contrast.

[Series 3: a/p w/o 5mm · axial · non-contrast · 0.69mm/px · z∈[+988,+1333]mm · 13 of 77 slices shown, 15 images]
[im 4/77  soft-tissue]
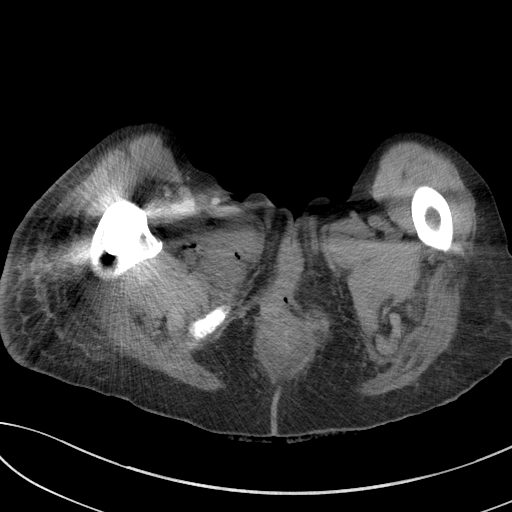
[im 4/77  bone]
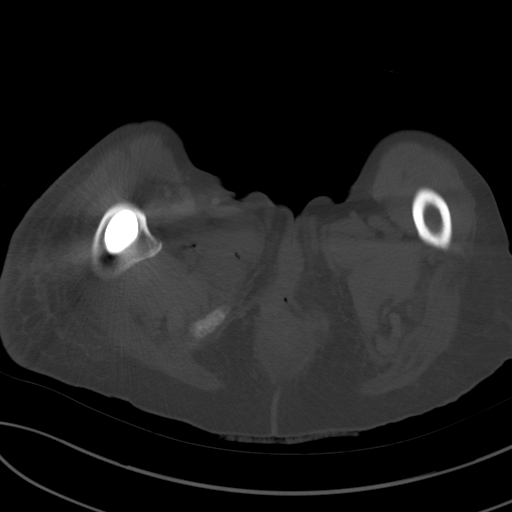
[im 10/77  soft-tissue]
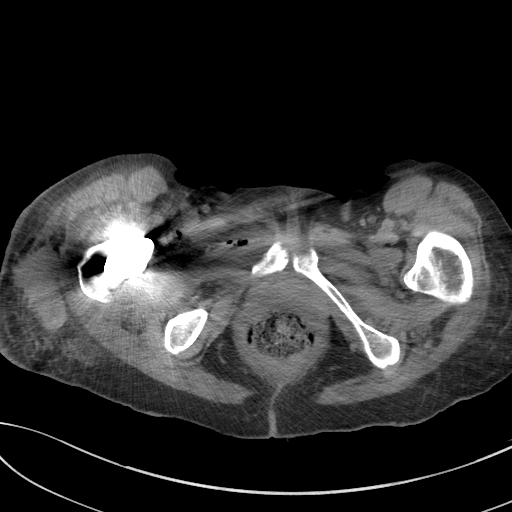
[im 16/77  soft-tissue]
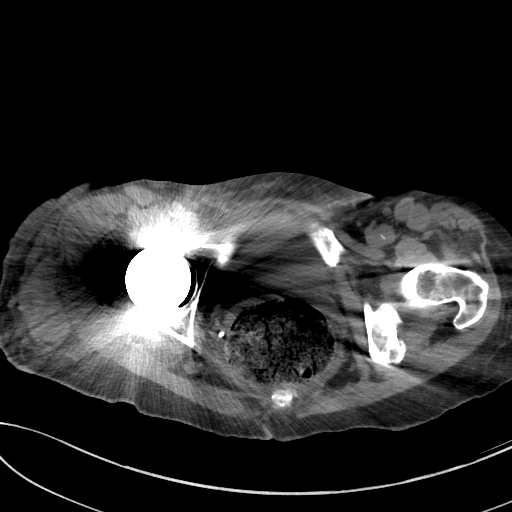
[im 22/77  soft-tissue]
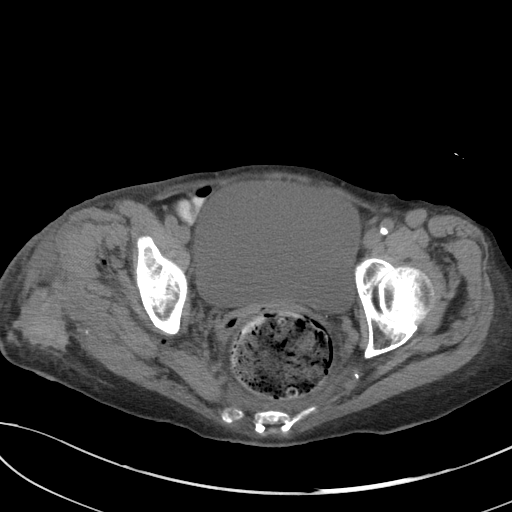
[im 28/77  soft-tissue]
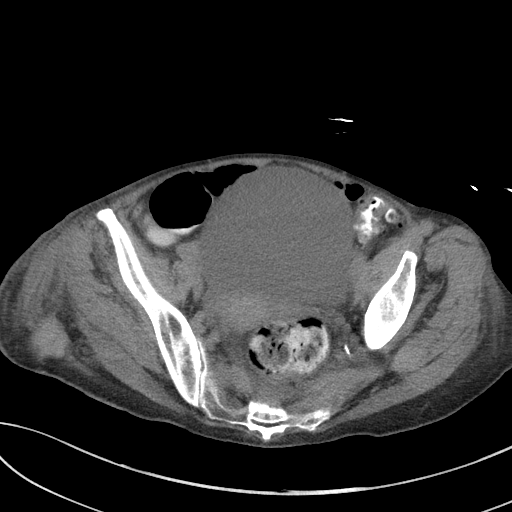
[im 34/77  soft-tissue]
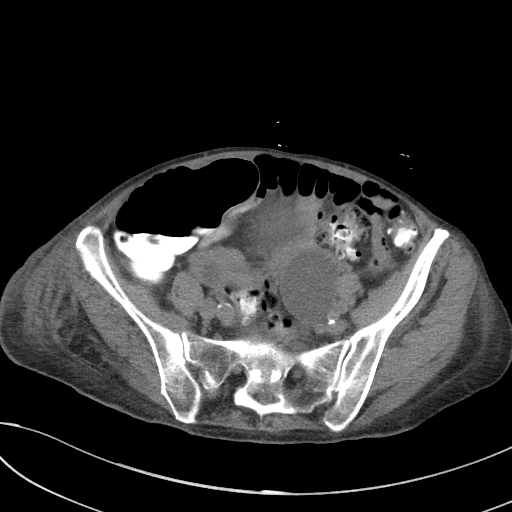
[im 40/77  soft-tissue]
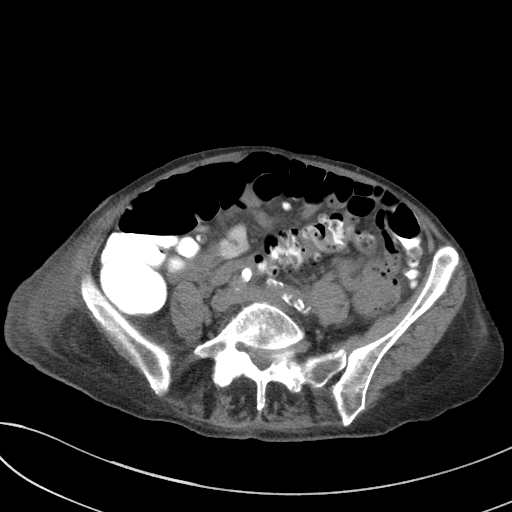
[im 43/77  soft-tissue]
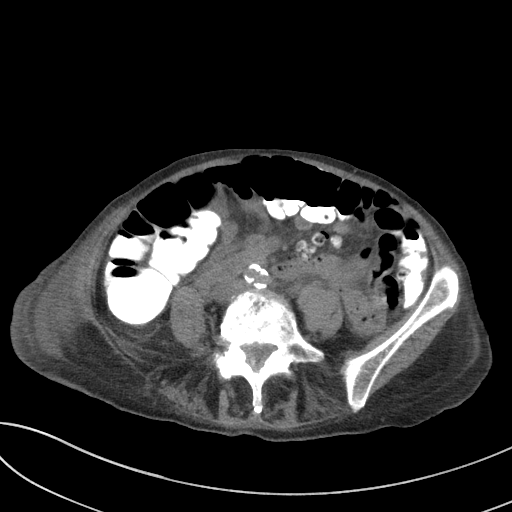
[im 49/77  soft-tissue]
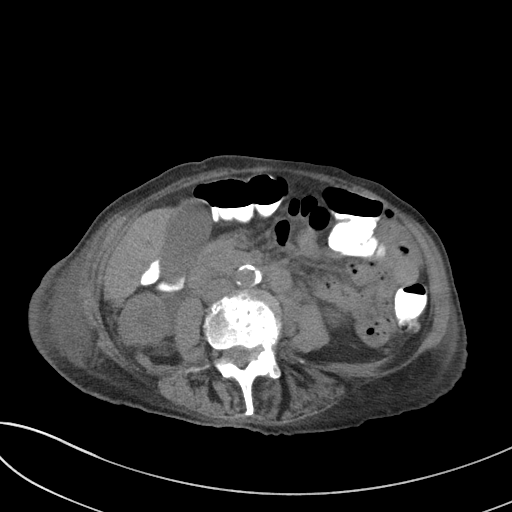
[im 49/77  bone]
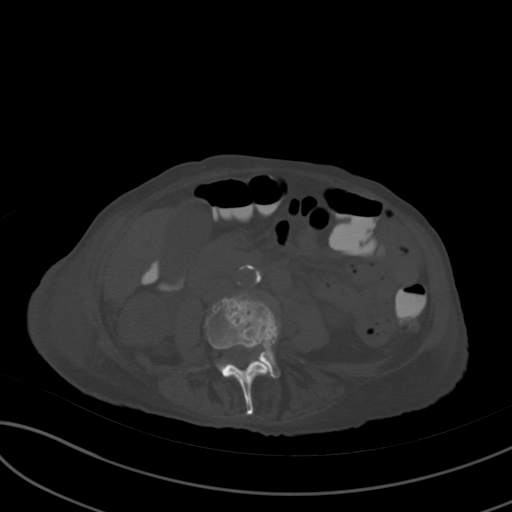
[im 55/77  soft-tissue]
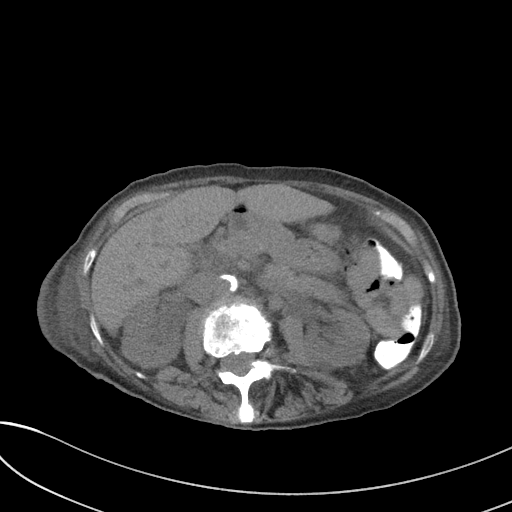
[im 61/77  soft-tissue]
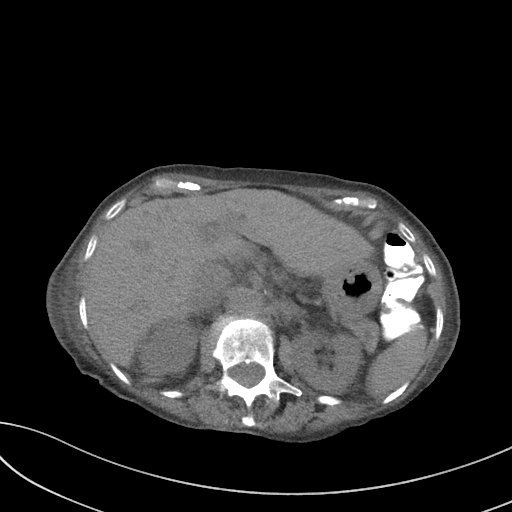
[im 67/77  soft-tissue]
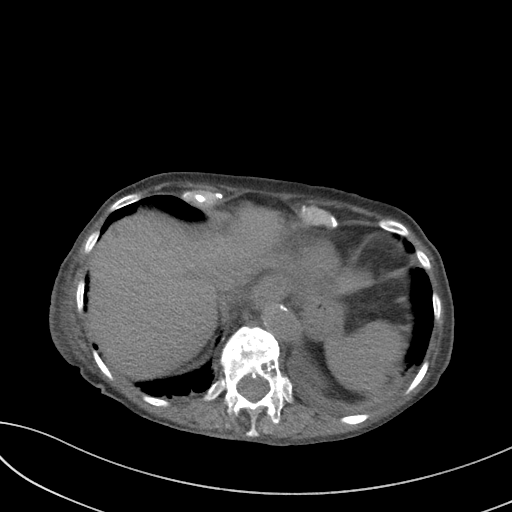
[im 73/77  soft-tissue]
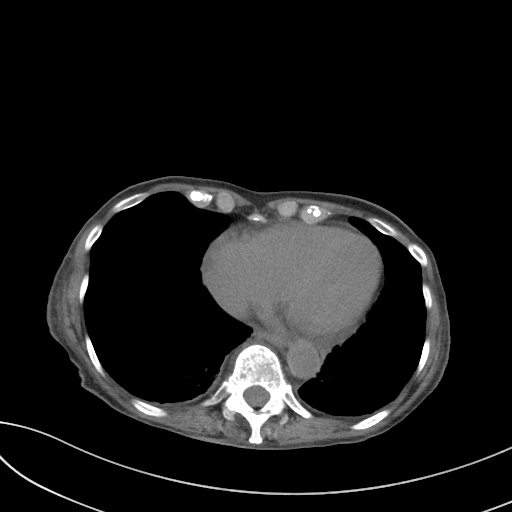

[Series 6: a/p w/o cor · coronal · non-contrast · 0.65mm/px · 3 of 127 slices shown]
[im 43/127  soft-tissue]
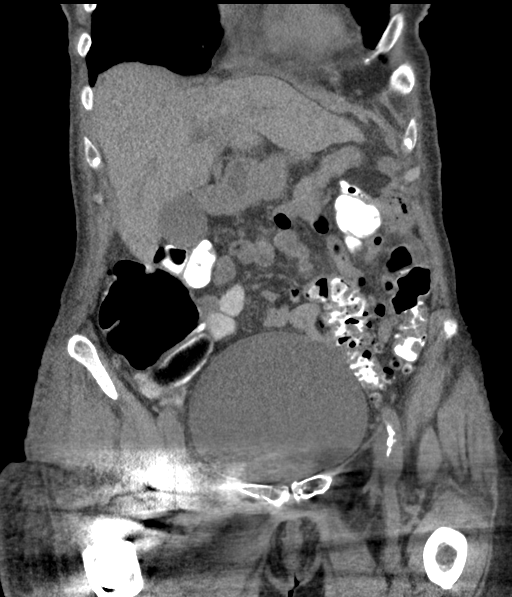
[im 57/127  soft-tissue]
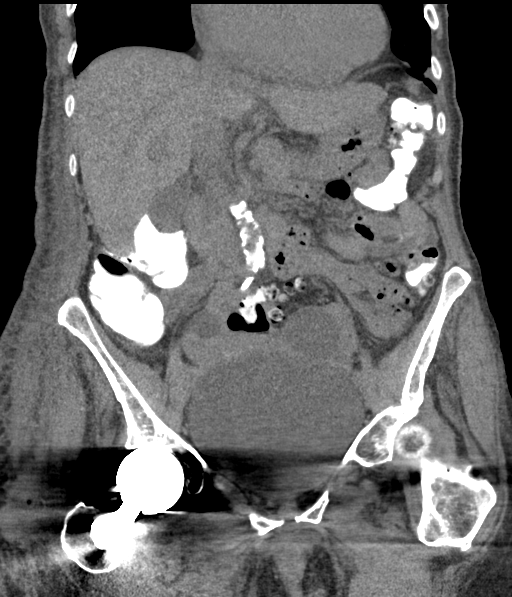
[im 71/127  soft-tissue]
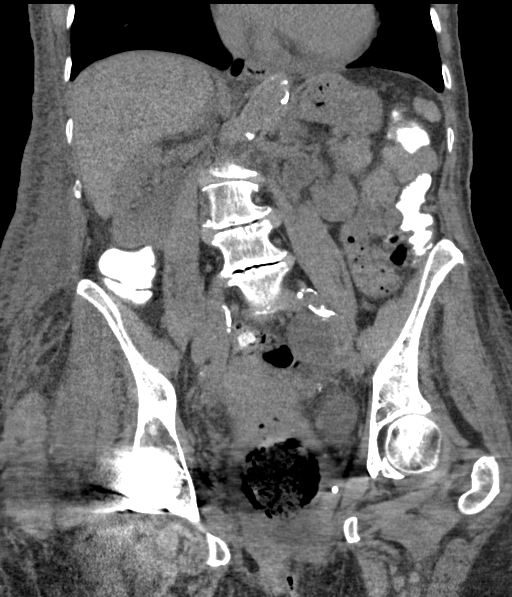

[16 of 46 positions shown; findings below may reference images not displayed]

FINDINGS: Lower chest: No pleural effusion.  Small volume pericardial fluid.

Hepatobiliary: No focal liver abnormality is seen. No gallstones,
gallbladder wall thickening, or biliary dilatation.

Pancreas: Unremarkable. No pancreatic ductal dilatation or
surrounding inflammatory changes.

Spleen: Normal in size without focal abnormality.

Adrenals/Urinary Tract: Adrenal glands unremarkable. 1.2 cm
low-attenuation mid left renal lesion possibly cyst but incompletely
characterized. Prominent bilateral extrarenal collecting systems.
The urinary bladder is distended.

Stomach/Bowel: Stomach decompressed. Small bowel nondilated. Normal
appendix. The colon is physiologically distended proximally.
Multiple descending and sigmoid diverticula without significant
adjacent inflammatory change. Fecal dilatation of the rectum up to
7.8 cm diameter.

Vascular/Lymphatic: Coarse aortoiliac arterial calcifications
without aneurysm. No abdominopelvic adenopathy.

Reproductive: Uterus unremarkable. 4.6 cm hypodense appearing left
cystic adnexal process.

Other: No ascites.  No free air.  No retroperitoneal hematoma.

Musculoskeletal: Lumbar dextroscoliosis with multilevel spondylitic
change. T12 compression fracture deformity with approximately 40%
loss of height anteriorly, age indeterminate. Left hip arthroplasty
components project in expected location. The distal aspect of the
femoral component is not visualized. 9.7 cm hematoma laterally in
the deep subcutaneous tissues of the right hip.
IMPRESSION: 1. 9.7 cm deep subcutaneous hematoma laterally in the right hip.
2. 4.6 cm left cystic adnexal process, not adequately characterized.
Recommend pelvic ultrasound for further evaluation.
3. Descending and sigmoid diverticulosis.
4. Fecal dilatation of the rectum up to 7.8 cm diameter.
5. T12 compression fracture deformity, age indeterminate.
6. 1.2 cm low-attenuation mid left renal lesion possibly cyst but
incompletely characterized.

Aortic Atherosclerosis ([id number]-J42.2).

## 2021-09-09 IMAGING — DX DG PORTABLE PELVIS
2 series · 2 of 2 positions shown · non-contrast
Comparison: 11/20/2020

CLINICAL DATA: Right hip reduction.

EXAM:
PORTABLE PELVIS 1-2 VIEWS

[pelvis ap (1 of 2)]
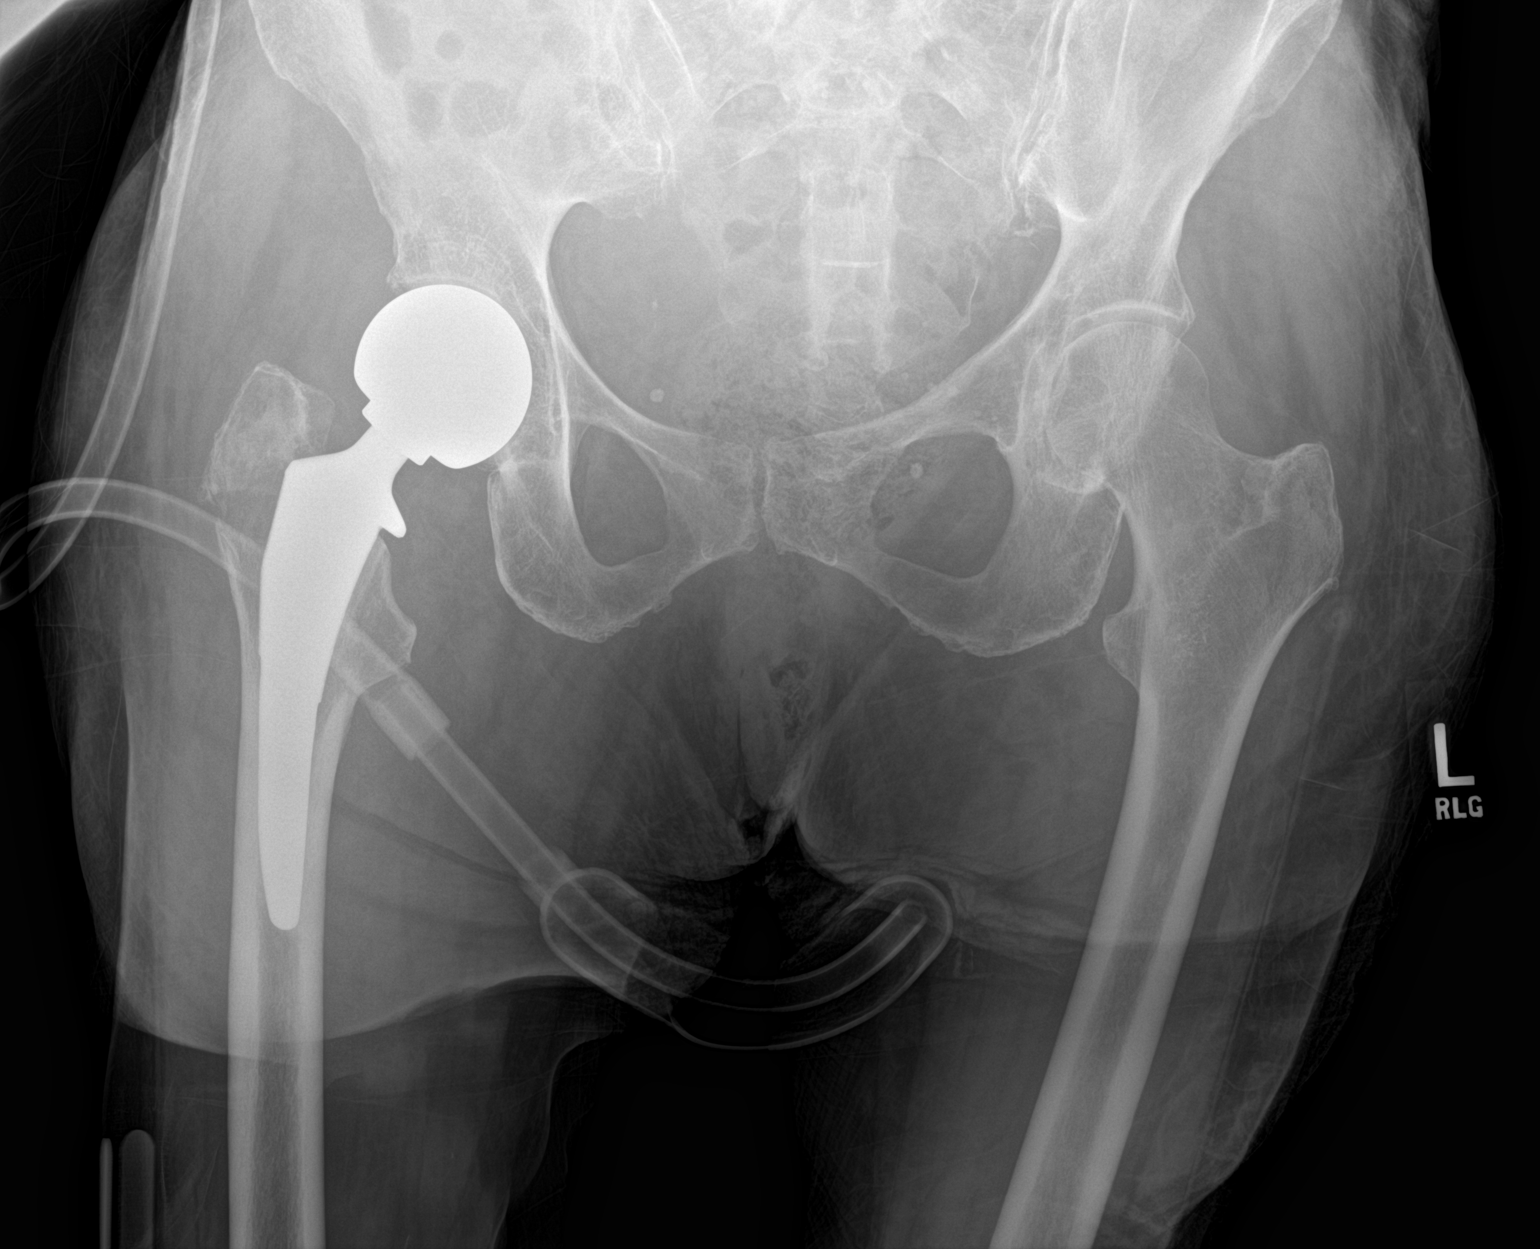

[pelvis ap (2 of 2)]
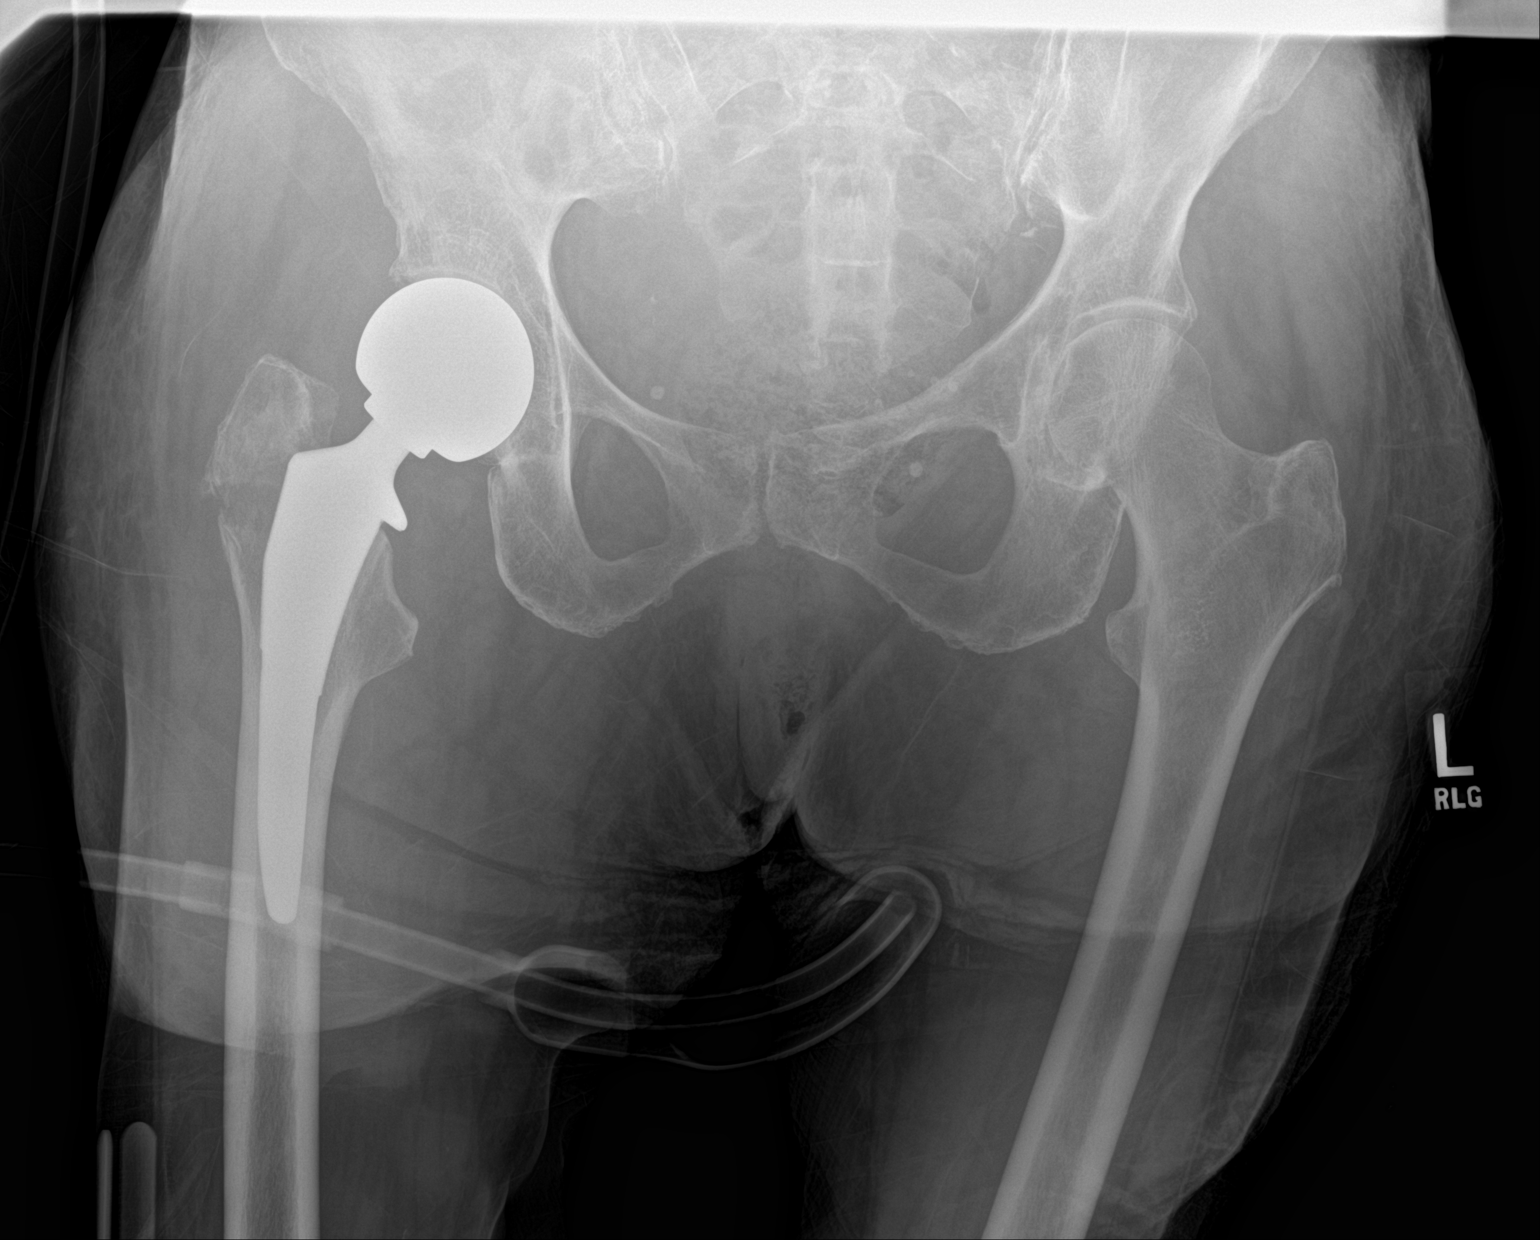

[2 of 2 positions shown; findings below may reference images not displayed]

FINDINGS: Right hip arthroplasty appears to be located on these views. Again
noted is a periprosthetic fracture involving the right great
trochanter. Left hip appears to be located.
IMPRESSION: 1. Right hip arthroplasty has been successfully reduced.
2. Displaced fracture of the right greater trochanter.

## 2022-06-27 ENCOUNTER — Emergency Department (HOSPITAL_COMMUNITY)
Admission: EM | Admit: 2022-06-27 | Discharge: 2022-06-27 | Disposition: A | Discharge status: Skilled Nursing Facility | Payer: Federal, State, Local not specified - PPO | Attending: Emergency Medicine | Admitting: Emergency Medicine

## 2022-06-27 ENCOUNTER — Emergency Department (HOSPITAL_COMMUNITY): Payer: Federal, State, Local not specified - PPO

## 2022-06-27 ENCOUNTER — Other Ambulatory Visit: Payer: Self-pay

## 2022-06-27 DIAGNOSIS — W06XXXA Fall from bed, initial encounter: Secondary | ICD-10-CM | POA: Insufficient documentation

## 2022-06-27 DIAGNOSIS — W19XXXA Unspecified fall, initial encounter: Secondary | ICD-10-CM

## 2022-06-27 DIAGNOSIS — Z23 Encounter for immunization: Secondary | ICD-10-CM | POA: Insufficient documentation

## 2022-06-27 DIAGNOSIS — S0101XA Laceration without foreign body of scalp, initial encounter: Secondary | ICD-10-CM | POA: Diagnosis not present

## 2022-06-27 DIAGNOSIS — S0990XA Unspecified injury of head, initial encounter: Secondary | ICD-10-CM | POA: Diagnosis present

## 2022-06-27 MED ORDER — TETANUS-DIPHTH-ACELL PERTUSSIS 5-2.5-18.5 LF-MCG/0.5 IM SUSY
0.5000 mL | PREFILLED_SYRINGE | Freq: Once | INTRAMUSCULAR | Status: AC
  Administered 2022-06-27: 0.5 mL via INTRAMUSCULAR
  Filled 2022-06-27: qty 0.5

## 2022-06-27 NOTE — ED Notes (Signed)
Patient transported to CT 

## 2022-06-27 NOTE — Discharge Instructions (Signed)
Return for any problem.  Staples placed today need to be removed in approximately 5 to 7 days.  Staples should be able to be removed by either your PCP, urgent care, or the emergency department.

## 2022-06-27 NOTE — ED Triage Notes (Signed)
Pt BIB [facility] from [facility] due to falling 0600.  Pt has about 1 inch laceration to left side of head.  Pt does not take blood thinners.  Unknown LOC at the time.  Hx of dementia.  Pt is at baseline right now per facility.  Pt can be combative.  VS BP 146/78, HR 70, SpO2 97%, CBG 136

## 2022-06-27 NOTE — ED Notes (Signed)
[facility] called to transport patient back to [facility].

## 2022-06-27 NOTE — ED Provider Notes (Signed)
[facility] EMERGENCY DEPARTMENT Provider Note   CSN: [id number] Arrival date & time: 06/27/22  0754     History  Chief Complaint  Patient presents with   Suzanne Hall    Suzanne Hall is a 88 y.o. female.  88 year old female with prior medical history as detailed below presents for evaluation.  Patient resides at [facility]  Patient apparently fell while try to get out of bed early this morning.  She did strike her head.  She has a laceration to the top of her head.  She does not take blood thinners.  Patient with unknown LOC.  Patient with significant dementia.  Patient is at baseline.  Patient's son is at bedside.  He makes medical decisions for the patient.  Patient is DNR.  The patient's son does not want aggressive workup.  He is okay with imaging studies.  He declines IV and/or labs.  He would prefer to focus on maintaining patient comfort.  The history is provided by the patient and medical records.       Home Medications Prior to Admission medications   Medication Sig Start Date End Date Taking? Authorizing Provider  acetaminophen (TYLENOL) 500 MG tablet Take 500 mg by mouth every 6 (six) hours as needed for moderate pain.    [provider]  ferrous sulfate 325 (65 FE) MG tablet Take 1 tablet (325 mg total) by mouth 3 (three) times daily after meals. 07/07/20   Dorcas Carrow, MD  lidocaine (LIDODERM) 5 % Place 1 patch onto the skin daily. Remove & Discard patch within 12 hours or as directed by MD 11/21/20   Standley Brooking, MD  polyethylene glycol (MIRALAX / GLYCOLAX) 17 g packet Take 17 g by mouth daily as needed. Patient taking differently: Take 17 g by mouth daily as needed for mild constipation. 07/07/20   Dorcas Carrow, MD  senna-docusate (SENOKOT-S) 8.6-50 MG tablet Take 1 tablet by mouth 2 (two) times daily. Patient taking differently: Take 1 tablet by mouth 2 (two) times daily as needed for mild constipation. 07/07/20   Dorcas Carrow,  MD  traMADol (ULTRAM) 50 MG tablet Take 1 tablet (50 mg total) by mouth every 12 (twelve) hours as needed for moderate pain or severe pain. 11/21/20   Standley Brooking, MD  Vitamin D, Ergocalciferol, (DRISDOL) 1.25 MG (50000 UNIT) CAPS capsule Take 1 capsule (50,000 Units total) by mouth every 7 (seven) days. 11/21/20   Standley Brooking, MD      Allergies    Patient has no known allergies.    Review of Systems   Review of Systems  All other systems reviewed and are negative.   Physical Exam Updated Vital Signs BP 129/68 (BP Location: Right Arm)   Pulse 72   Temp 98.1 F (36.7 C) (Axillary)   Resp 19   Ht 5\' 5"  (1.651 m)   Wt 49.9 kg   SpO2 99%   BMI 18.30 kg/m  Physical Exam Vitals and nursing note reviewed.  Constitutional:      General: She is not in acute distress.    Appearance: Normal appearance. She is well-developed.  HENT:     Head: Normocephalic.     Comments: 1 cm laceration to the apex of the scalp.  Minimal active bleeding. Eyes:     Conjunctiva/sclera: Conjunctivae normal.     Pupils: Pupils are equal, round, and reactive to light.  Cardiovascular:     Rate and Rhythm: Normal rate and regular rhythm.  Heart sounds: Normal heart sounds.  Pulmonary:     Effort: Pulmonary effort is normal. No respiratory distress.     Breath sounds: Normal breath sounds.  Abdominal:     General: There is no distension.     Palpations: Abdomen is soft.     Tenderness: There is no abdominal tenderness.  Musculoskeletal:        General: No deformity. Normal range of motion.     Cervical back: Normal range of motion and neck supple.  Skin:    General: Skin is warm and dry.  Neurological:     General: No focal deficit present.     Mental Status: She is alert.     Comments: Alert, pleasantly confused, at mental status baseline per son.     ED Results / Procedures / Treatments   Labs (all labs ordered are listed, but only abnormal results are displayed) Labs  Reviewed - No data to display  EKG EKG Interpretation  Date/Time:  Sunday June 27 2022 08:09:03 EST Ventricular Rate:  74 PR Interval:  241 QRS Duration: 106 QT Interval:  398 QTC Calculation: 442 R Axis:   140 Text Interpretation: Sinus rhythm Prolonged PR interval Probable lateral infarct, age indeterminate Anteroseptal infarct, old Confirmed by Kristine Royal [phone]) on 06/27/2022 8:11:47 AM  Radiology DG Pelvis Portable  Result Date: 06/27/2022 CLINICAL DATA:  88 year old female status post fall. EXAM: PORTABLE PELVIS 1-2 VIEWS COMPARISON:  Pelvis radiographs 11/21/2020 and earlier. FINDINGS: Portable AP supine view at 0826 hours. Chronic right hip arthroplasty. Femoral heads appear to remain normally located. Right iliac wing not entirely included, but visible pelvis appears stable and intact. Symmetric SI joints. Grossly intact proximal femurs. Partially visible advanced lower lumbar disc degeneration with vacuum disc. Negative visible bowel gas. Small pelvic phleboliths. IMPRESSION: No acute fracture or dislocation identified about the pelvis. Electronically Signed   By: Odessa Fleming M.D.   On: 06/27/2022 08:46   DG Chest Port 1 View  Result Date: 06/27/2022 CLINICAL DATA:  88 year old female status post fall. EXAM: PORTABLE CHEST 1 VIEW COMPARISON:  Portable chest 11/16/2020 and earlier. FINDINGS: Portable AP semi upright view at 0823 hours. Stable lung volumes and mediastinal contours. No cardiomegaly. Mild chronic aortic tortuosity. Allowing for portable technique the lungs are clear. Visualized tracheal air column is within normal limits. No pneumothorax or pleural effusion. Spinal scoliosis and degeneration. No acute osseous abnormality identified. Negative visible bowel gas. IMPRESSION: No acute cardiopulmonary abnormality or acute traumatic injury identified. Electronically Signed   By: Odessa Fleming M.D.   On: 06/27/2022 08:45    Procedures .Marland KitchenLaceration Repair  Date/Time:  06/27/2022 8:58 AM  Performed by: Wynetta Fines, MD Authorized by: Wynetta Fines, MD   Consent:    Consent obtained:  Verbal   Consent given by:  Patient   Risks, benefits, and alternatives were discussed: yes     Risks discussed:  Infection, need for additional repair, nerve damage, poor wound healing, poor cosmetic result, pain, retained foreign body, tendon damage and vascular damage   Alternatives discussed:  No treatment Universal protocol:    Immediately prior to procedure, a time out was called: yes     Patient identity confirmed:  Verbally with patient and arm band Anesthesia:    Anesthesia method:  None Laceration details:    Location:  Scalp   Scalp location:  Crown   Length (cm):  1 Pre-procedure details:    Preparation:  Patient was prepped and draped in usual  sterile fashion and imaging obtained to evaluate for foreign bodies Exploration:    Limited defect created (wound extended): no     Hemostasis achieved with:  Direct pressure   Imaging outcome: foreign body not noted     Wound exploration: wound explored through full range of motion     Contaminated: no   Treatment:    Area cleansed with:  Povidone-iodine   Amount of cleaning:  Standard   Irrigation solution:  Sterile saline   Debridement:  None   Scar revision: no   Skin repair:    Repair method:  Staples   Number of staples:  2 Approximation:    Approximation:  Close Repair type:    Repair type:  Simple Post-procedure details:    Dressing:  Open (no dressing)   Procedure completion:  Tolerated     Medications Ordered in ED Medications  Tdap (BOOSTRIX) injection 0.5 mL (0.5 mLs Intramuscular Given 06/27/22 0813)    ED Course/ Medical Decision Making/ A&P                           Medical Decision Making Amount and/or Complexity of Data Reviewed Radiology: ordered.  Risk Prescription drug management.    Medical Screen Complete  This patient presented to the ED with complaint of  fall, head injury, scalp laceration.  This complaint involves an extensive number of treatment options. The initial differential diagnosis includes, but is not limited to, trauma related to fall  This presentation is: Acute, Chronic, Self-Limited, Previously Undiagnosed, Uncertain Prognosis, Complicated, Systemic Symptoms, and Threat to Life/Bodily Function  Patient with history of advanced dementia presents after apparent fall from standing.  She did have a small laceration to the top of her scalp that was repaired without difficulty.  Imaging studies did not reveal significant acute abnormality.  Patient's son is in agreement with plan to DC back to [facility].  Importance of close follow-up is stressed.  Strict return precautions given and understood.    Co morbidities that complicated the patient's evaluation  Dementia   Additional history obtained:  Additional history obtained from EMS External records from outside sources obtained and reviewed including prior ED visits and prior Inpatient records.    Imaging Studies ordered:  I ordered imaging studies including CT HEAD/Cspine, CXR, Pelvis XR  I independently visualized and interpreted obtained imaging which showed NAD I agree with the radiologist interpretation.   Cardiac Monitoring:  The patient was maintained on a cardiac monitor.  I personally viewed and interpreted the cardiac monitor which showed an underlying rhythm of: NSR   Medicines ordered:  I ordered medication including TDAP  for prophylaxis  Reevaluation of the patient after these medicines showed that the patient: improved    Problem List / ED Course:  Fall, head injury, scalp laceration   Reevaluation:  After the interventions noted above, I reevaluated the patient and found that they have: improved   Disposition:  After consideration of the diagnostic results and the patients response to treatment, I feel that the patent would benefit  from discharge.          Final Clinical Impression(s) / ED Diagnoses Final diagnoses:  Fall, initial encounter  Injury of head, initial encounter  Laceration of scalp, initial encounter    Rx / DC Orders ED Discharge Orders     None         Wynetta Fines, MD 06/27/22 [phone]

## 2023-07-21 ENCOUNTER — Other Ambulatory Visit: Payer: Self-pay

## 2023-07-21 ENCOUNTER — Emergency Department (HOSPITAL_BASED_OUTPATIENT_CLINIC_OR_DEPARTMENT_OTHER): Payer: Federal, State, Local not specified - PPO

## 2023-07-21 ENCOUNTER — Emergency Department (HOSPITAL_BASED_OUTPATIENT_CLINIC_OR_DEPARTMENT_OTHER)
Admission: EM | Admit: 2023-07-21 | Discharge: 2023-07-21 | Disposition: A | Discharge status: Skilled Nursing Facility | Payer: Federal, State, Local not specified - PPO | Attending: Emergency Medicine | Admitting: Emergency Medicine

## 2023-07-21 DIAGNOSIS — S0181XA Laceration without foreign body of other part of head, initial encounter: Secondary | ICD-10-CM

## 2023-07-21 DIAGNOSIS — N189 Chronic kidney disease, unspecified: Secondary | ICD-10-CM | POA: Insufficient documentation

## 2023-07-21 DIAGNOSIS — F0390 Unspecified dementia without behavioral disturbance: Secondary | ICD-10-CM | POA: Insufficient documentation

## 2023-07-21 DIAGNOSIS — S01111A Laceration without foreign body of right eyelid and periocular area, initial encounter: Secondary | ICD-10-CM | POA: Insufficient documentation

## 2023-07-21 DIAGNOSIS — W19XXXA Unspecified fall, initial encounter: Secondary | ICD-10-CM | POA: Insufficient documentation

## 2023-07-21 DIAGNOSIS — S0990XA Unspecified injury of head, initial encounter: Secondary | ICD-10-CM | POA: Diagnosis present

## 2023-07-21 NOTE — ED Provider Notes (Signed)
89 year old female history of dementia presenting from a skilled nursing facility.  Patient was getting her haircut.  She does not normally walk, but tried to stand up and then fell.  No loss of conscious or syncope.  She is small laceration which has been repaired.  No traumatic injuries on CT scans.  No other signs of injury on exam.  Staff at bedside confirms she is at her baseline.  Suspect mechanical fall.  Will discharge her back to her SNF.  Working to update son.  SNF staff is here at bedside with patient.   Laurence Spates, MD 07/22/23 [phone]

## 2023-07-21 NOTE — ED Provider Notes (Signed)
[place] [facility] AT [facility] Provider Note   CSN: [id number] Arrival date & time: 07/21/23  1222     History  Chief Complaint  Patient presents with   Suzanne Hall    TACARA HADLOCK is a 89 y.o. female.   Fall   89 year old female presents emergency department after a fall.  Patient with history of dementia and not a very good historian at baseline.  Patient nonambulatory at baseline but can stand up with assistance to help pivot when positional changes per nursing staff.  Patient was getting her hair done and attempted to stand up when she was not being monitored and fell forward striking her head on the ground.  No LOC, blood thinner use, nausea, vomiting.  Per nursing staff, patient has been at baseline demented state ever since incident occurred.  Reported laceration above her right eyebrow but no obvious injury otherwise.  Patient states that she does not know what happened and is currently not in pain.  Patient denies any symptoms and does not know where she is at and why she is here seen: Nursing staff at living facility states that this is baseline mentation..  Past medical history significant for hyperlipidemia, dementia, CKD, osteopenia  Home Medications Prior to Admission medications   Medication Sig Start Date End Date Taking? Authorizing Provider  acetaminophen (TYLENOL) 500 MG tablet Take 500 mg by mouth every 6 (six) hours as needed for moderate pain.    [provider]  ferrous sulfate 325 (65 FE) MG tablet Take 1 tablet (325 mg total) by mouth 3 (three) times daily after meals. 07/07/20   Dorcas Carrow, MD  lidocaine (LIDODERM) 5 % Place 1 patch onto the skin daily. Remove & Discard patch within 12 hours or as directed by MD 11/21/20   Standley Brooking, MD  polyethylene glycol (MIRALAX / GLYCOLAX) 17 g packet Take 17 g by mouth daily as needed. Patient taking differently: Take 17 g by mouth daily as needed for mild constipation. 07/07/20    Dorcas Carrow, MD  senna-docusate (SENOKOT-S) 8.6-50 MG tablet Take 1 tablet by mouth 2 (two) times daily. Patient taking differently: Take 1 tablet by mouth 2 (two) times daily as needed for mild constipation. 07/07/20   Dorcas Carrow, MD  traMADol (ULTRAM) 50 MG tablet Take 1 tablet (50 mg total) by mouth every 12 (twelve) hours as needed for moderate pain or severe pain. 11/21/20   Standley Brooking, MD  Vitamin D, Ergocalciferol, (DRISDOL) 1.25 MG (50000 UNIT) CAPS capsule Take 1 capsule (50,000 Units total) by mouth every 7 (seven) days. 11/21/20   Standley Brooking, MD      Allergies    Patient has no known allergies.    Review of Systems   Review of Systems  All other systems reviewed and are negative.   Physical Exam Updated Vital Signs BP (!) 155/70   Pulse 76   Temp 97.9 F (36.6 C)   Resp 20   SpO2 95%  Physical Exam Vitals and nursing note reviewed.  Constitutional:      General: She is not in acute distress.    Appearance: She is well-developed. She is not ill-appearing.     Comments: Patient awake and communicative.  Baseline dementia.  Does not know where she was at or what happened which is her baseline per living facility.  HENT:     Head: Normocephalic.     Comments: 3.1 cm laceration appreciated right eyebrow. Eyes:  Conjunctiva/sclera: Conjunctivae normal.  Cardiovascular:     Rate and Rhythm: Normal rate and regular rhythm.  Pulmonary:     Effort: Pulmonary effort is normal. No respiratory distress.     Breath sounds: Normal breath sounds. No wheezing or rales.  Abdominal:     Palpations: Abdomen is soft.     Tenderness: There is no abdominal tenderness. There is no guarding.  Musculoskeletal:        General: No swelling.     Cervical back: Neck supple.     Comments: No midline tenderness of cervical spine, thoracic spine, lumbar spine without step-off or deformity.  No chest wall tenderness.  Able to move her arms and legs without obvious pain.   Left hand flexed at the wrist and digits.  Hips 80 ducted with knees touching.  Able to straighten and extend knees.  Radial pedal pulses 2+ bilaterally.  No obvious appreciable bruising, abrasions, lacerations besides above right eyebrow on full-body exam with nursing staff at bedside.  Skin:    General: Skin is warm and dry.     Capillary Refill: Capillary refill takes less than 2 seconds.  Neurological:     Mental Status: She is alert.     Comments: Alert and oriented to self  Speech is fluent, clear without dysarthria or dysphasia.   Strength symmetric in upper/lower extremities   Sensation intact in upper/lower extremities   Patient not ambulatory at baseline.  Gait not assessed CN I not tested  CN II not tested CN III, IV, VI PERRLA and EOMs intact bilaterally  CN V Intact sensation to sharp and light touch to the face  CN VII facial movements symmetric  CN VIII not tested  CN IX, X no uvula deviation, symmetric rise of soft palate  CN XI symmetric SCM and trapezius strength bilaterally  CN XII Midline tongue protrusion, symmetric L/R movements     Psychiatric:        Mood and Affect: Mood normal.     ED Results / Procedures / Treatments   Labs (all labs ordered are listed, but only abnormal results are displayed) Labs Reviewed - No data to display  EKG None  Radiology CT Head Wo Contrast Result Date: 07/21/2023 CLINICAL DATA:  Head trauma, minor (Age >= 65y); Neck trauma (Age >= 65y); Facial trauma, blunt EXAM: CT HEAD WITHOUT CONTRAST CT MAXILLOFACIAL WITHOUT CONTRAST CT CERVICAL SPINE WITHOUT CONTRAST TECHNIQUE: Multidetector CT imaging of the head, cervical spine, and maxillofacial structures were performed using the standard protocol without intravenous contrast. Multiplanar CT image reconstructions of the cervical spine and maxillofacial structures were also generated. RADIATION DOSE REDUCTION: This exam was performed according to the departmental dose-optimization  program which includes automated exposure control, adjustment of the mA and/or kV according to patient size and/or use of iterative reconstruction technique. COMPARISON:  CT head and C SPine 06/27/22. FINDINGS: CT HEAD FINDINGS Brain: No hemorrhage. No hydrocephalus. No extra-axial fluid collection. No mass effect. No mass lesion. There is advanced generalized volume loss with temporal lobe predominance. There is ventriculomegaly that is likely proportional to the degree of volume loss. Vascular: No hyperdense vessel or unexpected calcification. Skull: Normal. Negative for fracture or focal lesion. Other: None. CT MAXILLOFACIAL FINDINGS Osseous: No fracture or mandibular dislocation. No destructive process. Orbits: Negative. No traumatic or inflammatory finding. Sinuses: Small left mastoid effusion. No middle ear effusion. No right mastoid effusion. Air-fluid level in the right sphenoid sinus. Soft tissues: Soft tissue swelling in the pre maxillary soft  tissues on the right. CT CERVICAL SPINE FINDINGS Alignment: Trace anterolisthesis of C3 on C4 and C4 on C5. Mild retrolisthesis of C5 on C6. Skull base and vertebrae: No acute fracture. No primary bone lesion or focal pathologic process. Soft tissues and spinal canal: No prevertebral fluid or swelling. No visible canal hematoma. Disc levels:  No evidence of high-grade spinal canal stenosis. Upper chest: Negative. Other: None IMPRESSION: 1. No CT evidence of intracranial injury. 2. No acute facial bone fracture. 3. Soft tissue swelling in the pre maxillary soft tissues on the right. 4. No acute fracture or traumatic subluxation of the cervical spine. Electronically Signed   By: Lorenza Cambridge M.D.   On: 07/21/2023 15:04   CT Cervical Spine Wo Contrast Result Date: 07/21/2023 CLINICAL DATA:  Head trauma, minor (Age >= 65y); Neck trauma (Age >= 65y); Facial trauma, blunt EXAM: CT HEAD WITHOUT CONTRAST CT MAXILLOFACIAL WITHOUT CONTRAST CT CERVICAL SPINE WITHOUT  CONTRAST TECHNIQUE: Multidetector CT imaging of the head, cervical spine, and maxillofacial structures were performed using the standard protocol without intravenous contrast. Multiplanar CT image reconstructions of the cervical spine and maxillofacial structures were also generated. RADIATION DOSE REDUCTION: This exam was performed according to the departmental dose-optimization program which includes automated exposure control, adjustment of the mA and/or kV according to patient size and/or use of iterative reconstruction technique. COMPARISON:  CT head and C SPine 06/27/22. FINDINGS: CT HEAD FINDINGS Brain: No hemorrhage. No hydrocephalus. No extra-axial fluid collection. No mass effect. No mass lesion. There is advanced generalized volume loss with temporal lobe predominance. There is ventriculomegaly that is likely proportional to the degree of volume loss. Vascular: No hyperdense vessel or unexpected calcification. Skull: Normal. Negative for fracture or focal lesion. Other: None. CT MAXILLOFACIAL FINDINGS Osseous: No fracture or mandibular dislocation. No destructive process. Orbits: Negative. No traumatic or inflammatory finding. Sinuses: Small left mastoid effusion. No middle ear effusion. No right mastoid effusion. Air-fluid level in the right sphenoid sinus. Soft tissues: Soft tissue swelling in the pre maxillary soft tissues on the right. CT CERVICAL SPINE FINDINGS Alignment: Trace anterolisthesis of C3 on C4 and C4 on C5. Mild retrolisthesis of C5 on C6. Skull base and vertebrae: No acute fracture. No primary bone lesion or focal pathologic process. Soft tissues and spinal canal: No prevertebral fluid or swelling. No visible canal hematoma. Disc levels:  No evidence of high-grade spinal canal stenosis. Upper chest: Negative. Other: None IMPRESSION: 1. No CT evidence of intracranial injury. 2. No acute facial bone fracture. 3. Soft tissue swelling in the pre maxillary soft tissues on the right. 4. No  acute fracture or traumatic subluxation of the cervical spine. Electronically Signed   By: Lorenza Cambridge M.D.   On: 07/21/2023 15:04   CT Maxillofacial Wo Contrast Result Date: 07/21/2023 CLINICAL DATA:  Head trauma, minor (Age >= 65y); Neck trauma (Age >= 65y); Facial trauma, blunt EXAM: CT HEAD WITHOUT CONTRAST CT MAXILLOFACIAL WITHOUT CONTRAST CT CERVICAL SPINE WITHOUT CONTRAST TECHNIQUE: Multidetector CT imaging of the head, cervical spine, and maxillofacial structures were performed using the standard protocol without intravenous contrast. Multiplanar CT image reconstructions of the cervical spine and maxillofacial structures were also generated. RADIATION DOSE REDUCTION: This exam was performed according to the departmental dose-optimization program which includes automated exposure control, adjustment of the mA and/or kV according to patient size and/or use of iterative reconstruction technique. COMPARISON:  CT head and C SPine 06/27/22. FINDINGS: CT HEAD FINDINGS Brain: No hemorrhage. No hydrocephalus. No extra-axial fluid collection.  No mass effect. No mass lesion. There is advanced generalized volume loss with temporal lobe predominance. There is ventriculomegaly that is likely proportional to the degree of volume loss. Vascular: No hyperdense vessel or unexpected calcification. Skull: Normal. Negative for fracture or focal lesion. Other: None. CT MAXILLOFACIAL FINDINGS Osseous: No fracture or mandibular dislocation. No destructive process. Orbits: Negative. No traumatic or inflammatory finding. Sinuses: Small left mastoid effusion. No middle ear effusion. No right mastoid effusion. Air-fluid level in the right sphenoid sinus. Soft tissues: Soft tissue swelling in the pre maxillary soft tissues on the right. CT CERVICAL SPINE FINDINGS Alignment: Trace anterolisthesis of C3 on C4 and C4 on C5. Mild retrolisthesis of C5 on C6. Skull base and vertebrae: No acute fracture. No primary bone lesion or focal  pathologic process. Soft tissues and spinal canal: No prevertebral fluid or swelling. No visible canal hematoma. Disc levels:  No evidence of high-grade spinal canal stenosis. Upper chest: Negative. Other: None IMPRESSION: 1. No CT evidence of intracranial injury. 2. No acute facial bone fracture. 3. Soft tissue swelling in the pre maxillary soft tissues on the right. 4. No acute fracture or traumatic subluxation of the cervical spine. Electronically Signed   By: Lorenza Cambridge M.D.   On: 07/21/2023 15:04   DG Pelvis Portable Result Date: 07/21/2023 CLINICAL DATA:  Fall. EXAM: PORTABLE PELVIS 1-2 VIEWS COMPARISON:  Pelvic radiograph dated 06/27/2022. FINDINGS: There is a right hip arthroplasty. The arthroplasty appears intact. There is no acute fracture or dislocation. The bones are osteopenic. Degenerative changes of the lower lumbar spine. The soft tissues are unremarkable. IMPRESSION: 1. No acute fracture or dislocation. 2. Right hip arthroplasty. Electronically Signed   By: Elgie Collard M.D.   On: 07/21/2023 14:00   DG Chest Portable 1 View Result Date: 07/21/2023 CLINICAL DATA:  Fall. EXAM: PORTABLE CHEST 1 VIEW COMPARISON:  Chest radiograph dated 06/27/2022. FINDINGS: No focal consolidation, pleural effusion, pneumothorax. The cardiac silhouette is within normal limits. No acute osseous pathology. IMPRESSION: No active disease. Electronically Signed   By: Elgie Collard M.D.   On: 07/21/2023 13:58    Procedures .Laceration Repair  Date/Time: 07/21/2023 4:54 PM  Performed by: Peter Garter, PA Authorized by: Peter Garter, PA   Consent:    Consent obtained:  Verbal   Consent given by:  Patient and healthcare agent   Risks, benefits, and alternatives were discussed: yes     Risks discussed:  Need for additional repair, nerve damage, infection, poor wound healing, poor cosmetic result and pain   Alternatives discussed:  No treatment, delayed treatment and observation Universal  protocol:    Procedure explained and questions answered to patient or proxy's satisfaction: yes     Patient identity confirmed:  Verbally with patient Anesthesia:    Anesthesia method:  None Laceration details:    Location:  Face   Face location:  R eyebrow   Length (cm):  3.1 Pre-procedure details:    Preparation:  Imaging obtained to evaluate for foreign bodies and patient was prepped and draped in usual sterile fashion Exploration:    Limited defect created (wound extended): no     Hemostasis achieved with:  Direct pressure   Imaging obtained: x-ray     Imaging outcome: foreign body not noted     Wound exploration: wound explored through full range of motion and entire depth of wound visualized     Contaminated: no   Treatment:    Area cleansed with:  Saline  Irrigation solution:  Sterile water   Irrigation volume:  150cc   Irrigation method:  Syringe   Visualized foreign bodies/material removed: no     Debridement:  None   Undermining:  None   Scar revision: no   Skin repair:    Repair method:  Tissue adhesive Approximation:    Approximation:  Close Repair type:    Repair type:  Simple Post-procedure details:    Dressing:  Open (no dressing)   Procedure completion:  Tolerated well, no immediate complications     Medications Ordered in ED Medications - No data to display  ED Course/ Medical Decision Making/ A&P Clinical Course as of 07/21/23 1656  Thu Jul 21, 2023  1300 Talk to nursing staff at [employer] who witnessed incident.  Patient reportedly was getting her hair done at the salon sitting in the salon chair.  Patient nonambulatory at baseline but will stand somewhat with assistance when they are trying to pivot or move her from position to position.  Patient reportedly attempted to stand up without any assistance and fell forward hitting the right side of her head on the ground.  Patient did not lose consciousness, no nausea or vomiting.  Has not been  complaining of any pain since then per nursing staff and has been at baseline demented state.  [CR]    Clinical Course User Index [CR] Peter Garter, PA                                 Medical Decision Making Amount and/or Complexity of Data Reviewed Radiology: ordered.   This patient presents to the ED for concern of fall, this involves an extensive number of treatment options, and is a complaint that carries with it a high risk of complications and morbidity.  The differential diagnosis includes CVA, fracture, strain/pain, dislocation, ligamentous/tendinous injury, pneumothorax, neurovascular compromise, solid organ damage, other   Co morbidities that complicate the patient evaluation  See HPI   Additional history obtained:  Additional history obtained from EMR External records from outside source obtained and reviewed including hospital records   Lab Tests:  N/a   Imaging Studies ordered:  I ordered imaging studies including CT head/cervical spine/maxillofacial, pelvis/chest x-ray I independently visualized and interpreted imaging which showed  CT head/cervical spine/maxillofacial: No acute intracranial abnormality.  No acute facial bone fracture.  Soft tissue swelling in premaxillary soft tissues on right.  No acute fracture or traumatic subluxation of cervical spine. Chest x-ray: No acute abnormality. Pelvic x-ray: Right total hip.  No acute abnormality. I agree with the radiologist interpretation  Cardiac Monitoring: / EKG:  The patient was maintained on a cardiac monitor.  I personally viewed and interpreted the cardiac monitored which showed an underlying rhythm of: Sinus rhythm   Consultations Obtained:  See ED course  Problem List / ED Course / Critical interventions / Medication management  Fall, head injury I ordered medication including Dermabond  Reevaluation of the patient after these medicines showed that the patient improved I have reviewed  the patients home medicines and have made adjustments as needed   Social Determinants of Health:  Denies tobacco, licit drug use.   Test / Admission - Considered:  Fall, head injury  Vitals signs significant for hypertension blood pressure 157/66. Otherwise within normal range and stable throughout visit. Imaging studies significant for: See above 89 year old female presents emergency department after a witnessed fall at a hair  salon.  Patient not ambulatory at baseline and tried to stand up from the chair she is getting her hair done and fell to the ground hitting her head on the ground.  No LOC, blood thinner use, nausea, vomiting.  Patient with nonfocal neuroexam from patient's baseline.  Besides head injury, no further evidence of traumatic injury appreciated on exam or reproducible tenderness.  CT imaging was obtained of patient's head, maxillofacial as well as cervical spine of which were negative for any acute traumatic injury besides facial laceration.  Laceration repaired manner as above.  Screening chest x-ray as well as pelvis x-ray obtained of which appeared normal.  Patient evaluated by attending physician who is in agreement.  Will recommend treatment of any pain at home with Tylenol and follow-up with PCP in the outpatient setting.  Treatment plan discussed at length with patient's caregiver at bedside and they acknowledge understanding were agreeable to said plan. Worrisome signs and symptoms were discussed with the patient's caregiver, and he acknowledged understanding to return to the ED if noticed. Patient was stable upon discharge.          Final Clinical Impression(s) / ED Diagnoses Final diagnoses:  None    Rx / DC Orders ED Discharge Orders     None         Peter Garter, [place] 07/21/23 1656    Laurence Spates, MD 07/22/23 [phone]

## 2023-07-21 NOTE — ED Triage Notes (Signed)
Pt arrived via [facility]. Pt resides at [facility] [address] [place]- [place]. Pt was getting hair done and stood up from the salon chair and fell fwd.   Denies pain, LOC, and blood thinners.   Hx of dementia
